# Patient Record
Sex: Male | Born: 1960
Health system: Southern US, Community
[De-identification: ages and names within clinical notes are randomized; demographics above are authoritative.]

## PROBLEM LIST (undated history)

## (undated) DIAGNOSIS — R609 Edema, unspecified: Secondary | ICD-10-CM

## (undated) DIAGNOSIS — R7301 Impaired fasting glucose: Secondary | ICD-10-CM

## (undated) DIAGNOSIS — N529 Male erectile dysfunction, unspecified: Secondary | ICD-10-CM

## (undated) DIAGNOSIS — Z87442 Personal history of urinary calculi: Secondary | ICD-10-CM

## (undated) DIAGNOSIS — N200 Calculus of kidney: Secondary | ICD-10-CM

## (undated) DIAGNOSIS — G4730 Sleep apnea, unspecified: Secondary | ICD-10-CM

## (undated) DIAGNOSIS — R7989 Other specified abnormal findings of blood chemistry: Secondary | ICD-10-CM

## (undated) DIAGNOSIS — K7689 Other specified diseases of liver: Secondary | ICD-10-CM

## (undated) DIAGNOSIS — I10 Essential (primary) hypertension: Secondary | ICD-10-CM

## (undated) DIAGNOSIS — M5136 Other intervertebral disc degeneration, lumbar region: Secondary | ICD-10-CM

## (undated) DIAGNOSIS — R0683 Snoring: Secondary | ICD-10-CM

## (undated) DIAGNOSIS — R0609 Other forms of dyspnea: Secondary | ICD-10-CM

## (undated) DIAGNOSIS — E538 Deficiency of other specified B group vitamins: Secondary | ICD-10-CM

## (undated) DIAGNOSIS — R5383 Other fatigue: Secondary | ICD-10-CM

## (undated) DIAGNOSIS — M179 Osteoarthritis of knee, unspecified: Secondary | ICD-10-CM

## (undated) DIAGNOSIS — M5126 Other intervertebral disc displacement, lumbar region: Secondary | ICD-10-CM

## (undated) DIAGNOSIS — R931 Abnormal findings on diagnostic imaging of heart and coronary circulation: Secondary | ICD-10-CM

## (undated) DIAGNOSIS — E66813 Obesity, class 3: Secondary | ICD-10-CM

## (undated) DIAGNOSIS — E7800 Pure hypercholesterolemia, unspecified: Secondary | ICD-10-CM

## (undated) DIAGNOSIS — K5790 Diverticulosis of intestine, part unspecified, without perforation or abscess without bleeding: Secondary | ICD-10-CM

## (undated) DIAGNOSIS — M1712 Unilateral primary osteoarthritis, left knee: Secondary | ICD-10-CM

## (undated) DIAGNOSIS — J309 Allergic rhinitis, unspecified: Secondary | ICD-10-CM

## (undated) DIAGNOSIS — M1990 Unspecified osteoarthritis, unspecified site: Secondary | ICD-10-CM

## (undated) DIAGNOSIS — M51369 Other intervertebral disc degeneration, lumbar region without mention of lumbar back pain or lower extremity pain: Secondary | ICD-10-CM

## (undated) DIAGNOSIS — N400 Enlarged prostate without lower urinary tract symptoms: Secondary | ICD-10-CM

## (undated) HISTORY — PX: CYSTOSCOPY W/ STONE MANIPULATION: SHX1427

## (undated) HISTORY — DX: Calculus of kidney: N20.0

## (undated) HISTORY — DX: Benign prostatic hyperplasia without lower urinary tract symptoms: N40.0

## (undated) HISTORY — DX: Other forms of dyspnea: R06.09

## (undated) HISTORY — DX: Obesity, class 3: E66.813

## (undated) HISTORY — DX: Essential (primary) hypertension: I10

## (undated) HISTORY — DX: Other specified diseases of liver: K76.89

## (undated) HISTORY — DX: Other fatigue: R53.83

## (undated) HISTORY — DX: Impaired fasting glucose: R73.01

## (undated) HISTORY — DX: Deficiency of other specified B group vitamins: E53.8

## (undated) HISTORY — DX: Male erectile dysfunction, unspecified: N52.9

## (undated) HISTORY — DX: Allergic rhinitis, unspecified: J30.9

## (undated) HISTORY — DX: Osteoarthritis of knee, unspecified: M17.9

## (undated) HISTORY — DX: Other specified abnormal findings of blood chemistry: R79.89

## (undated) HISTORY — DX: Pure hypercholesterolemia, unspecified: E78.00

## (undated) HISTORY — DX: Abnormal findings on diagnostic imaging of heart and coronary circulation: R93.1

## (undated) HISTORY — PX: LITHOTRIPSY: SUR834

## (undated) HISTORY — PX: ADENOIDECTOMY: SUR15

## (undated) HISTORY — PX: COLONOSCOPY: SHX174

## (undated) HISTORY — DX: Morbid (severe) obesity due to excess calories: E66.01

## (undated) HISTORY — DX: Diverticulosis of intestine, part unspecified, without perforation or abscess without bleeding: K57.90

## (undated) HISTORY — DX: Edema, unspecified: R60.9

---
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
## 2001-08-28 ENCOUNTER — Emergency Department (HOSPITAL_COMMUNITY): Admission: EM | Admit: 2001-08-28 | Discharge: 2001-08-28 | Payer: Self-pay | Admitting: Emergency Medicine

## 2001-08-28 ENCOUNTER — Encounter: Payer: Self-pay | Admitting: Emergency Medicine

## 2001-09-03 ENCOUNTER — Encounter: Payer: Self-pay | Admitting: Urology

## 2001-09-03 ENCOUNTER — Ambulatory Visit (HOSPITAL_BASED_OUTPATIENT_CLINIC_OR_DEPARTMENT_OTHER): Admission: RE | Admit: 2001-09-03 | Discharge: 2001-09-03 | Payer: Self-pay | Admitting: Urology

## 2001-11-29 ENCOUNTER — Ambulatory Visit (HOSPITAL_BASED_OUTPATIENT_CLINIC_OR_DEPARTMENT_OTHER): Admission: RE | Admit: 2001-11-29 | Discharge: 2001-11-29 | Payer: Self-pay | Admitting: Urology

## 2001-11-29 ENCOUNTER — Encounter: Payer: Self-pay | Admitting: Urology

## 2002-07-11 ENCOUNTER — Encounter: Admission: RE | Admit: 2002-07-11 | Discharge: 2002-07-11 | Payer: Self-pay | Admitting: Family Medicine

## 2002-07-11 ENCOUNTER — Encounter: Payer: Self-pay | Admitting: Family Medicine

## 2002-08-13 ENCOUNTER — Encounter: Admission: RE | Admit: 2002-08-13 | Discharge: 2002-08-22 | Payer: Self-pay | Admitting: Family Medicine

## 2003-08-05 ENCOUNTER — Ambulatory Visit (HOSPITAL_COMMUNITY): Admission: RE | Admit: 2003-08-05 | Discharge: 2003-08-05 | Payer: Self-pay | Admitting: Family Medicine

## 2003-08-21 ENCOUNTER — Encounter: Admission: RE | Admit: 2003-08-21 | Discharge: 2003-09-15 | Payer: Self-pay | Admitting: Family Medicine

## 2006-02-14 HISTORY — PX: LASIK: SHX215

## 2006-12-25 ENCOUNTER — Ambulatory Visit (HOSPITAL_BASED_OUTPATIENT_CLINIC_OR_DEPARTMENT_OTHER): Admission: RE | Admit: 2006-12-25 | Discharge: 2006-12-25 | Payer: Self-pay | Admitting: Family Medicine

## 2006-12-31 ENCOUNTER — Ambulatory Visit: Payer: Self-pay | Admitting: Internal Medicine

## 2008-04-08 ENCOUNTER — Encounter (INDEPENDENT_AMBULATORY_CARE_PROVIDER_SITE_OTHER): Payer: Self-pay | Admitting: Interventional Cardiology

## 2008-04-08 ENCOUNTER — Ambulatory Visit (HOSPITAL_COMMUNITY): Admission: RE | Admit: 2008-04-08 | Discharge: 2008-04-08 | Payer: Self-pay | Admitting: Interventional Cardiology

## 2008-04-16 ENCOUNTER — Ambulatory Visit (HOSPITAL_COMMUNITY): Admission: RE | Admit: 2008-04-16 | Discharge: 2008-04-16 | Payer: Self-pay | Admitting: Interventional Cardiology

## 2008-07-30 ENCOUNTER — Ambulatory Visit: Payer: Self-pay | Admitting: Sports Medicine

## 2008-07-30 DIAGNOSIS — M545 Low back pain, unspecified: Secondary | ICD-10-CM | POA: Insufficient documentation

## 2008-07-30 DIAGNOSIS — F4323 Adjustment disorder with mixed anxiety and depressed mood: Secondary | ICD-10-CM | POA: Insufficient documentation

## 2008-08-22 ENCOUNTER — Encounter: Payer: Self-pay | Admitting: Family Medicine

## 2008-08-22 ENCOUNTER — Ambulatory Visit: Payer: Self-pay | Admitting: Sports Medicine

## 2008-08-26 ENCOUNTER — Ambulatory Visit (HOSPITAL_COMMUNITY): Admission: RE | Admit: 2008-08-26 | Discharge: 2008-08-26 | Payer: Self-pay | Admitting: Sports Medicine

## 2008-08-26 ENCOUNTER — Ambulatory Visit: Payer: Self-pay | Admitting: Sports Medicine

## 2008-08-26 DIAGNOSIS — M5126 Other intervertebral disc displacement, lumbar region: Secondary | ICD-10-CM | POA: Insufficient documentation

## 2008-09-01 ENCOUNTER — Ambulatory Visit: Payer: Self-pay | Admitting: Sports Medicine

## 2008-09-01 ENCOUNTER — Encounter: Payer: Self-pay | Admitting: Family Medicine

## 2009-06-08 ENCOUNTER — Encounter: Admission: RE | Admit: 2009-06-08 | Discharge: 2009-06-08 | Payer: Self-pay | Admitting: Family Medicine

## 2009-10-07 ENCOUNTER — Ambulatory Visit: Payer: Self-pay | Admitting: Sports Medicine

## 2009-10-07 DIAGNOSIS — S838X9A Sprain of other specified parts of unspecified knee, initial encounter: Secondary | ICD-10-CM

## 2009-10-07 DIAGNOSIS — S86819A Strain of other muscle(s) and tendon(s) at lower leg level, unspecified leg, initial encounter: Secondary | ICD-10-CM | POA: Insufficient documentation

## 2009-10-07 DIAGNOSIS — M79609 Pain in unspecified limb: Secondary | ICD-10-CM | POA: Insufficient documentation

## 2010-02-01 ENCOUNTER — Emergency Department (HOSPITAL_COMMUNITY)
Admission: EM | Admit: 2010-02-01 | Discharge: 2010-02-01 | Payer: Self-pay | Source: Home / Self Care | Admitting: Family Medicine

## 2010-03-16 ENCOUNTER — Ambulatory Visit
Admission: RE | Admit: 2010-03-16 | Discharge: 2010-03-16 | Payer: Self-pay | Source: Home / Self Care | Attending: Family Medicine | Admitting: Family Medicine

## 2010-03-16 ENCOUNTER — Encounter
Admission: RE | Admit: 2010-03-16 | Discharge: 2010-03-16 | Payer: Self-pay | Source: Home / Self Care | Attending: Family Medicine | Admitting: Family Medicine

## 2010-03-16 DIAGNOSIS — M25569 Pain in unspecified knee: Secondary | ICD-10-CM | POA: Insufficient documentation

## 2010-03-16 NOTE — Assessment & Plan Note (Signed)
Summary: CALF PAIN,MC   Vital Signs:  Patient profile:   50 year old male Height:      71 inches Weight:      245 pounds BMI:     34.29 BP sitting:   136 / 97  Vitals Entered By: Lillia Pauls CMA (October 07, 2009 2:35 PM)  History of Present Illness: Pt is a 50 yo WM with left calf pain for 8 days while jogging, about 30 minutes in. This was normal time and intensity for him. He has felt tightness in the calf ever since and has not been able to stretch much due to pain. No pain in ankle or knee. He has done some rest, ice, and stretching. Ibu 800 hasn't helped. Celebrex has helped with other problems in past. He has a hx of HTN normally controlled with meds.  Allergies (verified): No Known Drug Allergies  Review of Systems General:  Denies chills, fatigue, fever, loss of appetite, malaise, sleep disorder, sweats, weakness, and weight loss. MS:  Complains of muscle aches and stiffness; denies joint pain, joint redness, joint swelling, loss of strength, low back pain, mid back pain, muscle , cramps, muscle weakness, and thoracic pain.  Physical Exam  General:  Well-developed,well-nourished,in no acute distress; alert,appropriate and cooperative throughout examination Msk:  No deformity or scoliosis noted of thoracic or lumbar spine.   Knee: Normal to inspection with no erythema or effusion or obvious bony abnormalities. Palpation normal with no warmth or joint line tenderness or patellar tenderness or condyle tenderness. ROM normal in flexion and extension and lower leg rotation. Ligaments with solid consistent endpoints including ACL, PCL, LCL, MCL. Negative Mcmurray's and provocative meniscal tests. Non painful patellar compression. Patellar and quadriceps tendons unremarkable. Hamstring and quadriceps strength is normal.   Ankle: No visible erythema or swelling. Range of motion is full in all directions. Strength is 5/5 in all directions. Stable lateral and medial ligaments;  squeeze test and kleiger test unremarkable; Talar dome nontender; No pain at base of 5th MT; No tenderness over cuboid; No tenderness over N spot or navicular prominence No tenderness on posterior aspects of lateral and medial malleolus No sign of peroneal tendon subluxations; Negative tarsal tunnel tinel's Able to walk 4 steps.  tender to deep palpation over the musculotendinous junction of the gastroc and soleus . No e/o swelling, redness, or palpable cords. Neurologic:  No cranial nerve deficits noted. Station and gait are normal. Plantar reflexes are down-going bilaterally. DTRs are symmetrical throughout. Sensory, motor and coordinative functions appear intact.   Impression & Recommendations:  Problem # 1:  MUSCLE STRAIN, LEFT CALF (ICD-844.8) celebrex for short term anti-inflammatory ice  stretching when painfree eccentric strengthening exercises shown follow up in 3-4 weeks if not improving  Complete Medication List: 1)  Amitriptyline Hcl 25 Mg Tabs (Amitriptyline hcl) .Marland Kitchen.. 1 or 2 by mouth qhs 2)  Tramadol Hcl 100 Mg Xr24h-tab (Tramadol hcl) .Marland Kitchen.. 1 tab 2-4 times daily as needed pain 3)  Neurontin 300 Mg Caps (Gabapentin) .Marland Kitchen.. 1 tab by mouth three times a day 4)  Celebrex 200 Mg Caps (Celecoxib) .... One once daily for seven days with food, then prn  Other Orders: Garment,belt,sleeve or other covering ,elastic or similar stretch (E4540) Prescriptions: CELEBREX 200 MG CAPS (CELECOXIB) one once daily for seven days with food, then prn  #60 x 2   Entered by:   Lillia Pauls CMA   Authorized by:   Corbin Ade MD   Signed by:   Arelia Longest  Moore CMA on 10/07/2009   Method used:   Electronically to        [facility]* (retail)       [address]       [address] Shipping/mailing       [place], [place]  [address]       Ph: [phone]       Fax: [phone]   RxID:   [id number]   Appended Document: CALF PAIN,MC     Allergies: No Known Drug  Allergies   Impression & Recommendations:  Problem # 1:  MUSCLE STRAIN, LEFT CALF (ICD-844.8) see other note, only needed to bill Est level 3  Complete Medication List: 1)  Amitriptyline Hcl 25 Mg Tabs (Amitriptyline hcl) .Marland Kitchen.. 1 or 2 by mouth qhs 2)  Tramadol Hcl 100 Mg Xr24h-tab (Tramadol hcl) .Marland Kitchen.. 1 tab 2-4 times daily as needed pain 3)  Neurontin 300 Mg Caps (Gabapentin) .Marland Kitchen.. 1 tab by mouth three times a day 4)  Celebrex 200 Mg Caps (Celecoxib) .... One once daily for seven days with food, then prn

## 2010-03-24 NOTE — Assessment & Plan Note (Signed)
Summary: KNEE PAIN,MC   Vital Signs:  Patient profile:   50 year old male Height:      71 inches Weight:      245 pounds Pulse rate:   65 / minute BP sitting:   126 / 89  (left arm)  Vitals Entered By: Rochele Pages RN (March 16, 2010 3:07 PM) CC: Lt knee pain- anterior   History of Present Illness: 50 year old male presents with left knee pain that has been ongoing for the last several weeks. Pain medially with crepitus anteriorly. A great deal of "creaking" and "popping" with flexion and movement of the knee. Really started to hurt last week when running on the treadmill, about 30 mins (approx 5 mins run time at about 5.1 mph pace) with run walking.   Then knee got significantly painful, pain medially, and he felt like he locked up while on the couch. Also felt this same sensation in the morning, but none sense. No giving way.  No prior operative interventions.  He has been taking Celebrex.  Preventive Screening-Counseling & Management  Alcohol-Tobacco     Smoking Status: never  Allergies: No Known Drug Allergies  Past History:  Past medical, surgical, family and social histories (including risk factors) reviewed, and no changes noted (except as noted below).  Past Surgical History: n/c  Family History: Reviewed history and no changes required. n/c  Social History: Reviewed history and no changes required. Works for [employer]. Active, enjoys running.Smoking Status:  never  Review of Systems       REVIEW OF SYSTEMS  GEN: No systemic complaints, no fevers, chills, sweats, or other acute illnesses MSK: Detailed in the HPI GI: tolerating PO intake without difficulty Neuro: No numbness, parasthesias, or tingling associated. Otherwise the pertinent positives of the ROS are noted above.    Physical Exam  General:  GEN: Well-developed,well-nourished,in no acute distress; alert,appropriate and cooperative throughout examination HEENT: Normocephalic and  atraumatic without obvious abnormalities. No apparent alopecia or balding. Ears, externally no deformities PULM: Breathing comfortably in no respiratory distress EXT: No clubbing, cyanosis, or edema PSYCH: Normally interactive. Cooperative during the interview. Pleasant. Friendly and conversant. Not anxious or depressed appearing. Normal, full affect.  Msk:  Gait: Normal heel toe pattern ROM: WNL Effusion: neg Echymosis or edema: none Patellar tendon NT Painful PLICA: neg -  BUT DIFFUSE, PROFOUND PLICA THROUGHOUT BOTH KNEES Patellar grind: negative Medial and lateral patellar facet loading: negative medial and lateral joint lines:POSTERIOR MEDIAL L SIDED JOINT LINE PAIN Mcmurray's POS Flexion-pinch neg BOUNCE HOME POS Varus and valgus stress: stable Lachman: neg Ant and Post drawer: POSTERIOR DRAWER WITH NOTABLE INCREASED POSTERIOR TRANSLATION Hip abduction, IR, ER: WNL Hip flexion str: 5/5 Hip abd: 5/5 Quad: 5/5 VMO atrophy:MILD Hamstring concentric and eccentric: 5/5    Impression & Recommendations:  Problem # 1:  KNEE PAIN, LEFT, ACUTE (ICD-719.46) Assessment New Probable medial  meniscal degenerative tear clinically I suspect that he has an old PCL injury and is PCL deficient as well -- may contribute to the instability of his knee.  Knee Injection Patient verbally consented to procedure. Risks, benefits, and alternatives explained. Sterilely prepped with betadine. Ethyl cholride used for anesthesia. 9 cc Lidocaine 1% mixed with 1 cc of Kenalog 40 mg injected using the anteromedial approach without difficulty. No complications with procedure and tolerated well. Patient had decreased pain post-injection.    His updated medication list for this problem includes:    Tramadol Hcl 100 Mg Xr24h-tab (Tramadol hcl) .Marland KitchenMarland KitchenMarland KitchenMarland Kitchen  1 tab 2-4 times daily as needed pain    Celebrex 200 Mg Caps (Celecoxib) ..... One once daily for seven days with food, then prn  Orders: Radiology other  (Radiology Other) Joint Aspirate / Injection, Large (20610) Kenalog 10mg  (4units) (J3301)  Complete Medication List: 1)  Amitriptyline Hcl 25 Mg Tabs (Amitriptyline hcl) .Marland Kitchen.. 1 or 2 by mouth qhs 2)  Tramadol Hcl 100 Mg Xr24h-tab (Tramadol hcl) .Marland Kitchen.. 1 tab 2-4 times daily as needed pain 3)  Neurontin 300 Mg Caps (Gabapentin) .Marland Kitchen.. 1 tab by mouth three times a day 4)  Celebrex 200 Mg Caps (Celecoxib) .... One once daily for seven days with food, then prn  Patient Instructions: 1)  Continue with Celebrex 2)  if it runs out, 3)  Alleve 2 tabs by mouth two times a day over the counter, take at least for 2 - 3 weeks. This is a prescription strength dose.  4)  GO FOR YOUR KNEE XRAYS 5)  f/u 4 weeks. 6)  After you have an injection of any joint, the numbing medicine will make it feel better for a few hours. Later tonight, it is common for the joint to feel worse. The steroid will take 48-72 hours to start working - it is the thing that will likely provide the most relief. 7)  Ice the joint where you had the injection at least 2-3 times a day for 20 minutes for 3 days. If have had swelling, pain in the joint itself, ice for 1 week. 8)  You can use an ice bag, frozen peas or corn, an ice pack - all work    Orders Added: 1)  Radiology other [Radiology Other] 2)  Est. Patient Level IV [09811] 3)  Joint Aspirate / Injection, Large [20610] 4)  Kenalog 10mg  (4units) [J3301]

## 2010-06-29 NOTE — Procedures (Signed)
NAME:  Adam Avila, [place] NO.:  [id number]   MEDICAL RECORD NO.:  [id number]          PATIENT TYPE:  OUT   LOCATION:  SLEEP CENTER                 FACILITY:  [facility]   PHYSICIAN:  [name], MD, FCCP, FACPDATE OF BIRTH:  1960/09/29   DATE OF STUDY:  12/25/2006                            NOCTURNAL POLYSOMNOGRAM   REFERRING PHYSICIAN:  Chales Salmon. Abigail Miyamoto, M.D.   INDICATION FOR STUDY:  Insomnia with sleep apnea.   EPWORTH SLEEPINESS SCORE:  10/24, BMI 30.1, weight 210 pounds, height 70  inches, neck 14.5 inches.   MEDICATIONS:  Home medication listed as Diovan.   SLEEP ARCHITECTURE:  Total sleep time 317 minutes with sleep efficiency  88%.  Stage I was 4%, stage II 73%, stage III 1%, REM 21% of total sleep  time.  Sleep latency 20 minutes, REM latency 56 minutes, awake after  sleep onset 22 minutes, arousal index 7.  Diovan was taken at bedtime.   RESPIRATORY DATA:  Apnea-hypopnea index (AHI) 4.2 obstructive events per  hour which is within normal limits (normal range 0 to 5 per hour).  There were a total of 22 respiratory events including 5 obstructive  apneas and 17 hypopneas.  All events occurred while sleeping supine.  REM AHI 11.8.  There were insufficient events to permit CPAP titration  by split protocol on this study night.   OXYGEN DATA:  Moderate to loud snoring with oxygen desaturation to a  nadir of 84%.  Mean oxygen saturation through the study was 96% on room  air.   CARDIAC DATA:  Normal sinus rhythm.   MOVEMENT-PARASOMNIA:  No significant movement disturbance.   IMPRESSIONS-RECOMMENDATIONS:  1. Unremarkable sleep architecture for sleep center environment.  2. Occasional respiratory events within normal limits, apnea-hypopnea      index 4.2 per hour (normal range 0 to 5 per hour).  Events were      more common while supine.  Moderate to loud snoring with oxygen      desaturation to a nadir of 84%.  3. Continuous positive airway pressure  therapy is not usually      indicated for scores in this range.  There was a      strong positional component, suggesting that emphasis on sleeping      off flat of back and, if appropriate, treating for a significant      upper airway or nasal obstruction and weight loss may be useful as      clinically indicated.      [name], MD, [facility], FACP  Diplomate, Biomedical engineer of Sleep Medicine  Electronically Signed     CDY/MEDQ  D:  12/31/2006 14:55:33  T:  01/01/2007 09:05:14  Job:  [id number]

## 2010-07-02 NOTE — Op Note (Signed)
NAME:  Adam Avila, Adam Avila                            ACCOUNT NO.:  [id number]   MEDICAL RECORD NO.:  [id number]                   PATIENT TYPE:  AMB   LOCATION:  [facility]                                 FACILITY:  [facility]   PHYSICIAN:  Excell Seltzer. Annabell Howells, M.D.                 DATE OF BIRTH:  12/24/1960   DATE OF PROCEDURE:  11/28/2001  DATE OF DISCHARGE:                                 OPERATIVE REPORT   PROCEDURE:  Left ureteroscopic stone extraction.   PREOPERATIVE DIAGNOSIS:  Left ureterovesical junction stone.   POSTOPERATIVE DIAGNOSIS:  Left ureterovesical junction stone.   SURGEON:  Excell Seltzer. Annabell Howells, M.D.   ANESTHESIA:  General.   SPECIMENS:  Stone.   COMPLICATIONS:  None.   INDICATIONS:  Mr. Serio is a 50 year old white male with a residual left  distal ureteral stone fragment, measuring approximately 4 mm, following a  lithotripsy.  The stone has failed to progress after over two months, and it  was felt that ureteroscopy was indicated.   FINDINGS AT PROCEDURE:  The patient was given p.o. Levaquin.  He was taken  to the operating room where a general anesthetic was induced.  He was placed  in lithotomy position.  His perineum and genitalia were prepped with  Betadine solution.  He was draped in the usual sterile fashion.  An initial  attempt was made to pass a 22 French cystoscope sheath; however, he had some  meatal narrowing, and the scope would not readily pass.  I then elected to  use the 6 French short ureteroscope.  This was passed per urethra and  advanced under visual guidance into the bladder.  The urethra was  unremarkable.  The prostatic urethra was short without obstruction.  Examination of the bladder revealed mild trabeculation.  No tumor, stones,  or inflammation were noted.  The ureteral orifices were unremarkable.  Initially, an attempt was made to advance the scope up the left ureteral  orifice.  This was not successful.  I then passed a guidewire through the  ureteroscope up the orifice and once again attempted to advance the scope  unsuccessfully.  I then removed the scope and advanced a 4 cm 15 French  balloon dilation catheter over the wire.  The balloon was then dilated  across the meatus to 10 atmospheres x 2.  When the waist disappeared, the  balloon was deflated and removed.  The 6 French ureteroscope was then passed  alongside the wire, up the ureter, until the stone was visualized.  The  stone was grasped with a nitinol basket and removed without difficulty.  Reinspection of the ureter after retrieval demonstrated some moderate  inflammatory changes in the distal ureter from the stone and dilation, but  it was not felt to be sufficiently severe to mandate a stent.  At this  point, the wire was removed along with the ureteroscope, and the bladder  was  drained with a 16 French red rubber catheter.  The patient was taken down  from the lithotomy position.  His anesthetic was reversed.  He was moved to  the recovery room in stable condition.  There were no complications.                                              Excell Seltzer. Annabell Howells, M.D.   JJW/MEDQ  D:  11/29/2001  T:  11/29/2001  Job:  [id number]

## 2010-07-15 ENCOUNTER — Emergency Department (HOSPITAL_COMMUNITY)
Admission: EM | Admit: 2010-07-15 | Discharge: 2010-07-15 | Disposition: A | Discharge status: Home / Self Care | Payer: 59 | Attending: Emergency Medicine | Admitting: Emergency Medicine

## 2010-07-15 DIAGNOSIS — M79609 Pain in unspecified limb: Secondary | ICD-10-CM | POA: Insufficient documentation

## 2010-07-15 DIAGNOSIS — W540XXA Bitten by dog, initial encounter: Secondary | ICD-10-CM | POA: Insufficient documentation

## 2010-07-15 DIAGNOSIS — L02519 Cutaneous abscess of unspecified hand: Secondary | ICD-10-CM | POA: Insufficient documentation

## 2010-07-15 DIAGNOSIS — M7989 Other specified soft tissue disorders: Secondary | ICD-10-CM | POA: Insufficient documentation

## 2010-07-15 DIAGNOSIS — I10 Essential (primary) hypertension: Secondary | ICD-10-CM | POA: Insufficient documentation

## 2010-07-15 DIAGNOSIS — S61409A Unspecified open wound of unspecified hand, initial encounter: Secondary | ICD-10-CM | POA: Insufficient documentation

## 2013-11-07 ENCOUNTER — Encounter: Payer: Self-pay | Admitting: Sports Medicine

## 2013-11-07 ENCOUNTER — Ambulatory Visit (INDEPENDENT_AMBULATORY_CARE_PROVIDER_SITE_OTHER): Payer: 59 | Admitting: Sports Medicine

## 2013-11-07 VITALS — BP 134/91 | Ht 71.0 in | Wt 250.0 lb

## 2013-11-07 DIAGNOSIS — M1712 Unilateral primary osteoarthritis, left knee: Secondary | ICD-10-CM | POA: Insufficient documentation

## 2013-11-07 DIAGNOSIS — M1710 Unilateral primary osteoarthritis, unspecified knee: Secondary | ICD-10-CM

## 2013-11-07 DIAGNOSIS — M5126 Other intervertebral disc displacement, lumbar region: Secondary | ICD-10-CM

## 2013-11-07 DIAGNOSIS — M5430 Sciatica, unspecified side: Secondary | ICD-10-CM

## 2013-11-07 DIAGNOSIS — M5441 Lumbago with sciatica, right side: Secondary | ICD-10-CM | POA: Insufficient documentation

## 2013-11-07 MED ORDER — METHYLPREDNISOLONE ACETATE 40 MG/ML IJ SUSP
40.0000 mg | Freq: Once | INTRAMUSCULAR | Status: AC
  Administered 2013-11-07: 40 mg via INTRA_ARTICULAR

## 2013-11-07 NOTE — Assessment & Plan Note (Signed)
DDD, bulging disc with mid radiculopathy  Recommends: -continue celexbrex 200mg  daily and may increase to BID with flares -Given exercises for core stability -Stay active and working on weight loss -Provided note to possibly obtain ergonomic chair with lumbar support at work

## 2013-11-07 NOTE — Patient Instructions (Signed)
It was great to see you today! Call the office if you have any questions [phone]   1. Work are stabilizing exercises 2 work on quad and hamstring strengthening exercises 3 make followup in one month

## 2013-11-07 NOTE — Assessment & Plan Note (Deleted)
DDD, bulging disc with mid radiculopathy  Recommends: -continue celexbrex 200mg  daily and may increase to BID with flares -Given exercises for core stability -Stay active and working on weight loss -Provided note to possibly obtain ergonomic chair with lumbar support at work

## 2013-11-07 NOTE — Progress Notes (Signed)
Adam Avila - 53 y.o. male MRN [id number]  Date of birth: [birth year]  SUBJECTIVE:  Including CC & ROS.  Patient is a obese 53 yo male who male who presents today to f/u on his long standing history of chronic right lumbar back pain and chronic left knee pain.   LBP: Patient reports know history of bulging disc found several years ago. MRI of lumbar spine 2010 showed L4-5 where a disc bulge eccentric to the right slightly deflects the descending right L5 root. He reports no constant pain but instead he gets intermittent flares every several months. He has been managing pain with Celebrex 200mg  daily and staying active for walking 3 miles daily. He reports some radiation of pain into the right buttock but no pain, numbness, or tingling down the leg.   L Knee pain: also present for years with no MOI, slow progressive onset. Worse with describes an intermittent dull ache worse with stairs, stooping, squatting, no catching or giving way but frequent popping and clicking. Pain is diffuse not localized to either joint line. Pt reports good response to steroid injection in 2012. Also controlling pain with celebrex and staying active.     ROS: Review of systems otherwise negative except for information present in HPI  HISTORY: Past Medical, Surgical, Social, and Family History Reviewed & Updated per EMR. Pertinent Historical Findings include: Obesity Chronic back pain Mild to moderate DJD   DATA REVIEWED: MRI of lumbar spine 2010 showed L4-5 where a disc bulge eccentric to the right slightly deflects the descending right L5 root. 3 view Left knee xray 2012 with mild to moderate DJD with medial joint line narrowing  PHYSICAL EXAM:  VS: BP:134/91 mmHg  HR: bpm  TEMP: ( )  RESP:   HT:5\' 11"  (180.3 cm)   WT:250 lb (113.399 kg)  BMI:34.9 KNEE EXAM:  General: well nourished Skin of LE: warm; dry, no rashes, lesions, ecchymosis or erythema. Vascular: Dorsal pedal pulses 2+  bilaterally Neurologically: Sensation to light touch lower extremities equal and intact bilaterally.  Observation: no knee effusion visualized, no previous surgical scars, patella in place, no quad muscle atrophy Palpation:  No evidence of knee effusion with negative ballotable patella sign, no swelling to supra-patellar bursa  No tenderness over the tibial tuberosity or patellar tendons, or quadriceps tendon.   No pain along the lateral joint line or medial joint line Range of motion: Full ROM at the hip. Full knee flexion to approximately 140, full extension 0, normal patellar tracking Ligamentous testing: ligamentous stability intact, Negative patella apprehension test Meniscal evaluation: Negative McMurray's test, Negative thessaly's test, Normal gait  LOW BACK EXAM: Observation: Normal curvature and no kyphosis or lordosis, no scoliosis.  Iliac crests are symmetric, shoulders line symmetrically Palpation: No step off defects noted in the thoracic or lumbar spine.   Range of motion: Normal flexion on forward bending, no pain with extension, no pain with one leg hyperextension. Neuromuscular: No pain with straight leg raise left or right, normal gait walking with walking on heels or walking on toes, normal tandem gait.  Nerve root motor and sensory intervention  L2 and L3: Normal hip flexion with no weakness. L2, L3, L4: Normal hip abduction bilaterally.  Normal patellar DTR +3 bilaterally L4, L5, S1: Normal hip abduction bilaterally S1 and S2: Normal ankle plantar-flexion bilaterally.  Normal Achilles tendon DTR +3 bilaterally L5: Normal extensor hallucis longus bilaterally  ASSESSMENT & PLAN: See problem based charting & AVS for pt instructions. Injection procedure: Consent  obtained and verified. Sterile betadine prep. Further cleansed with alcohol. Topical analgesic spray: Ethyl chloride. Injection Indication: Left knee primary OA Approached in typical fashion with: lateral  approach Completed without difficulty Meds: 1cc 40mg  DepoMedrol, 4cc lidocaine 1% Needle: 25g 1.5in Aftercare instructions and Red flags advised. Advised to call if fevers/chills, erythema, induration, drainage, or persistent bleeding.

## 2013-12-05 ENCOUNTER — Ambulatory Visit (INDEPENDENT_AMBULATORY_CARE_PROVIDER_SITE_OTHER): Payer: 59 | Admitting: Sports Medicine

## 2013-12-05 ENCOUNTER — Encounter: Payer: Self-pay | Admitting: Sports Medicine

## 2013-12-05 ENCOUNTER — Ambulatory Visit (HOSPITAL_COMMUNITY)
Admission: RE | Admit: 2013-12-05 | Discharge: 2013-12-05 | Disposition: A | Discharge status: Home / Self Care | Payer: 59 | Source: Ambulatory Visit | Attending: Sports Medicine | Admitting: Sports Medicine

## 2013-12-05 VITALS — BP 132/86 | HR 64 | Ht 71.0 in | Wt 250.0 lb

## 2013-12-05 DIAGNOSIS — M23304 Other meniscus derangements, unspecified medial meniscus, left knee: Secondary | ICD-10-CM | POA: Insufficient documentation

## 2013-12-05 DIAGNOSIS — S83242D Other tear of medial meniscus, current injury, left knee, subsequent encounter: Secondary | ICD-10-CM

## 2013-12-05 DIAGNOSIS — M23204 Derangement of unspecified medial meniscus due to old tear or injury, left knee: Secondary | ICD-10-CM

## 2013-12-05 DIAGNOSIS — S83282A Other tear of lateral meniscus, current injury, left knee, initial encounter: Secondary | ICD-10-CM | POA: Insufficient documentation

## 2013-12-05 NOTE — Progress Notes (Signed)
  Adam Avila - 53 y.o. male MRN [id number]  Date of birth: [birth year]  SUBJECTIVE:  Including CC & ROS.  Patient is a obese 53 yo male who male who presents today to f/u on his long standing history of chronic right lumbar back pain and chronic left knee pain.   L Knee pain: also present for years with no MOI, slow progressive onset. Worse with describes an intermittent dull ache worse with stairs, stooping, squatting, intermittent catching and locking no giving way but frequent popping and clicking. Pt reports good response to steroid injection at our last visit 4 weeks ago which help the inflammation and pain but still have intermittent dull ache with more aggressive walking, or any squatting. He is concern that the catching sensation feel like something is stuck in the knee.  LBP: Patient reports know history of bulging disc found several years ago. MRI of lumbar spine 2010 showed L4-5 where a disc bulge to right L5 root. He reports no constant pain but instead he gets intermittent flares every several months. He has been managing pain with Celebrex 200mg  daily and staying active for walking 3 miles daily. He reports some radiation of pain into the right buttock but no pain, numbness, or tingling down the leg. This is fairly will managed with the exercise daily and HEP provided at last visit  ROS: Review of systems otherwise negative except for information present in HPI  HISTORY: Past Medical, Surgical, Social, and Family History Reviewed & Updated per EMR. Pertinent Historical Findings include: Obesity Chronic back pain Mild to moderate DJD   DATA REVIEWED: MRI of lumbar spine 2010 showed L4-5 where a disc bulge eccentric to the right slightly deflects the descending right L5 root. 3 view Left knee xray 2012 with mild to moderate DJD with medial joint line narrowing  PHYSICAL EXAM:  VS: BP:132/86 mmHg  HR:64bpm  TEMP: ( )  RESP:   HT:5\' 11"  (180.3 cm)   WT:250 lb (113.399 kg)   BMI:34.9 KNEE EXAM:  General: well nourished Skin of LE: warm; dry, no rashes, lesions, ecchymosis or erythema. Vascular: Dorsal pedal pulses 2+ bilaterally Neurologically: Sensation to light touch lower extremities equal and intact bilaterally.  Observation: no knee effusion visualized, no previous surgical scars, patella in place, no quad muscle atrophy Palpation:  No evidence of knee effusion with negative ballotable patella sign, no swelling to supra-patellar bursa  No tenderness over the tibial tuberosity or patellar tendons, or quadriceps tendon.   Pain along medial joint line Range of motion: Full ROM at the hip. Full knee flexion to approximately 140, full extension 0, normal patellar tracking Ligamentous testing: ligamentous stability intact, Negative patella apprehension test Meniscal evaluation: Positive for clicking and pain McMurray's test, positive for pain with thessaly's test, Normal gait  ASSESSMENT & PLAN: See problem based charting & AVS for pt instructions.

## 2013-12-05 NOTE — Patient Instructions (Signed)
X-rays can be done at the hospital today without at appt  MRI is set for Thursday 12/12/13 at 3pm at [facility] time is 245p [phone]

## 2013-12-05 NOTE — Assessment & Plan Note (Signed)
Impression: Left knee medial meniscus tear  Patient's history of persistent knee pain with sensations of locking and catching, positive medial joint line tenderness, positive clicking and pain with McMurray's and thus a least I am concerned for possible medial meniscus tear. Patient also has some underlying osteoarthritis that is mild based on his x-rays 3 years ago.  Recommendations: -Repeat 4 view knee x-rays to evaluate level of osteoarthritis -Given the patient has failed conservative management including cortisone injection, therapeutic exercise, and working on weight loss with an exam concerning for meniscus tear I recommended evaluating his knee with MRI. -Will followup with patient over the phone based on MRI results patient is comfortable with referral to orthopedic surgeon for possible arthroscopic surgery if there is presence of degenerative meniscus tear

## 2013-12-12 ENCOUNTER — Ambulatory Visit (HOSPITAL_COMMUNITY): Payer: 59

## 2013-12-19 ENCOUNTER — Ambulatory Visit (HOSPITAL_COMMUNITY): Admission: RE | Admit: 2013-12-19 | Payer: 59 | Source: Ambulatory Visit

## 2014-02-19 ENCOUNTER — Ambulatory Visit (HOSPITAL_COMMUNITY)
Admission: RE | Admit: 2014-02-19 | Discharge: 2014-02-19 | Disposition: A | Discharge status: Home / Self Care | Payer: 59 | Source: Ambulatory Visit | Attending: Sports Medicine | Admitting: Sports Medicine

## 2014-02-19 DIAGNOSIS — M179 Osteoarthritis of knee, unspecified: Secondary | ICD-10-CM | POA: Insufficient documentation

## 2014-02-19 DIAGNOSIS — R937 Abnormal findings on diagnostic imaging of other parts of musculoskeletal system: Secondary | ICD-10-CM | POA: Insufficient documentation

## 2014-02-19 DIAGNOSIS — S86812A Strain of other muscle(s) and tendon(s) at lower leg level, left leg, initial encounter: Secondary | ICD-10-CM | POA: Diagnosis not present

## 2014-02-19 DIAGNOSIS — X58XXXA Exposure to other specified factors, initial encounter: Secondary | ICD-10-CM | POA: Diagnosis not present

## 2014-02-19 DIAGNOSIS — M65862 Other synovitis and tenosynovitis, left lower leg: Secondary | ICD-10-CM | POA: Insufficient documentation

## 2014-02-19 DIAGNOSIS — M94262 Chondromalacia, left knee: Secondary | ICD-10-CM | POA: Diagnosis not present

## 2014-02-19 DIAGNOSIS — S83242D Other tear of medial meniscus, current injury, left knee, subsequent encounter: Secondary | ICD-10-CM

## 2014-02-19 DIAGNOSIS — S83242A Other tear of medial meniscus, current injury, left knee, initial encounter: Secondary | ICD-10-CM | POA: Diagnosis present

## 2014-02-19 DIAGNOSIS — M25562 Pain in left knee: Secondary | ICD-10-CM | POA: Diagnosis present

## 2014-02-19 DIAGNOSIS — M25462 Effusion, left knee: Secondary | ICD-10-CM | POA: Diagnosis not present

## 2014-02-21 ENCOUNTER — Telehealth: Payer: Self-pay | Admitting: Sports Medicine

## 2014-02-21 DIAGNOSIS — S83282A Other tear of lateral meniscus, current injury, left knee, initial encounter: Secondary | ICD-10-CM

## 2014-02-21 DIAGNOSIS — M1712 Unilateral primary osteoarthritis, left knee: Secondary | ICD-10-CM

## 2014-02-25 ENCOUNTER — Encounter: Payer: Self-pay | Admitting: *Deleted

## 2014-02-25 NOTE — Telephone Encounter (Signed)
Spoke with patient over the phone to discuss MRI results.   IMPRESSION: 1. Severe lateral compartment osteoarthritis with grade IV chondromalacia of the weight-bearing tibial plateau. 2. Abnormal morphology of the lateral meniscus suggesting a severely degenerated discoid meniscus. The anterior horn is severely degenerated with diffuse fraying. 3. Intact medial meniscus. 4. Patellar tendinosis with a small superior patellar tendon intrasubstance tear.  Electronically Signed By: Dereck Ligas M.D. On: 02/19/2014 12:26  After a discuss of the severity of this patient internal derangement he was agree for referral to orthopedic surgery. Patient will be schedule with Dr. Noemi Chapel.

## 2014-02-25 NOTE — Patient Instructions (Signed)
[place] DR Noemi Chapel THURS 02/27/14 @215PM  [address] [place] [address] [phone]

## 2014-12-17 ENCOUNTER — Encounter (HOSPITAL_COMMUNITY): Payer: Self-pay | Admitting: Physician Assistant

## 2014-12-17 NOTE — H&P (Signed)
TOTAL KNEE ADMISSION H&P  Patient is being admitted for left total knee arthroplasty.  Subjective:  Chief Complaint:left knee pain.  HPI: Adam Avila, 54 y.o. male, has a history of pain and functional disability in the left knee due to arthritis and has failed non-surgical conservative treatments for greater than 12 weeks to includeNSAID's and/or analgesics, corticosteriod injections, viscosupplementation injections, flexibility and strengthening excercises, supervised PT with diminished ADL's post treatment, weight reduction as appropriate and activity modification.  Onset of symptoms was gradual, starting 9 years ago with gradually worsening course since that time. The patient noted no past surgery on the left knee(s).  Patient currently rates pain in the left knee(s) at 9 out of 10 with activity. Patient has night pain, worsening of pain with activity and weight bearing, pain that interferes with activities of daily living, crepitus and joint swelling.  Patient has evidence of subchondral sclerosis, periarticular osteophytes and joint space narrowing by imaging studies.  There is no active infection.  Patient Active Problem List   Diagnosis Date Noted  . Tear of lateral meniscus of left knee 12/05/2013  . Primary osteoarthritis of left knee 11/07/2013  . Right-sided low back pain with right-sided sciatica 11/07/2013  . MUSCLE STRAIN, LEFT CALF 10/07/2009  . HERNIATED LUMBAR DISK WITH RADICULOPATHY 08/26/2008  . ADJ DISORDER WITH MIXED ANXIETY & DEPRESSED MOOD 07/30/2008   Past Medical History  Diagnosis Date  . Hypertension   . HNP (herniated nucleus pulposus), lumbar     l4-5  . DDD (degenerative disc disease), lumbar     Past Surgical History  Procedure Laterality Date  . Lithotripsy    . Cystoscopy w/ stone manipulation      No prescriptions prior to admission  No current facility-administered medications for this encounter.  Current outpatient prescriptions:  .   celecoxib (CELEBREX) 200 MG capsule, Take 200 mg by mouth 2 (two) times daily., Disp: , Rfl:  No Known Allergies  Social History  Substance Use Topics  . Smoking status: Never Smoker   . Smokeless tobacco: Not on file  . Alcohol Use: 0.0 oz/week    0 Standard drinks or equivalent per week     Comment: weekly     Family History  Problem Relation Age of Onset  . Osteoarthritis Mother   . Osteoarthritis Father   . Hypertension Father   . AAA (abdominal aortic aneurysm) Father   . Diabetes Sister      Review of Systems  HENT: Negative.   Eyes: Negative.   Respiratory: Negative.   Cardiovascular: Negative.   Gastrointestinal: Negative.   Genitourinary: Negative.   Musculoskeletal: Positive for joint pain.       Left knee  Skin: Negative.   Neurological: Negative.   Endo/Heme/Allergies: Negative.   Psychiatric/Behavioral: Negative.     Objective:  Physical Exam  Constitutional: He is oriented to person, place, and time. He appears well-developed and well-nourished.  HENT:  Head: Normocephalic and atraumatic.  Mouth/Throat: Oropharynx is clear and moist.  Eyes: Pupils are equal, round, and reactive to light.  Neck: Neck supple.  Cardiovascular: Normal rate and regular rhythm.   Respiratory: Effort normal and breath sounds normal.  GI: Soft. Bowel sounds are normal.  Genitourinary:  Not pertinent to current symptomatology therefore not examined.  Musculoskeletal:  Examination of his left knee reveals pain medially and laterally.  Mild valgus deformity.  1+ crepitation.  Range of motion -5 to 125 degrees.  Knee is stable with normal patella tracking.  Examination  of the right knee reveals full range of motion without pain, swelling, weakness or instability.    Neurological: He is alert and oriented to person, place, and time.  Skin: Skin is warm and dry.  Psychiatric: He has a normal mood and affect. His behavior is normal.    Vital signs in last 24 hours:     Labs:   Estimated body mass index is 34.88 kg/(m^2) as calculated from the following:   Height as of 12/05/13: 5\' 11"  (1.803 m).   Weight as of 02/19/14: 113.399 kg (250 lb).   Imaging Review Plain radiographs demonstrate severe degenerative joint disease of the left knee(s). The overall alignment issignificant valgus. The bone quality appears to be good for age and reported activity level.  Assessment/Plan:  End stage arthritis, left knee   The patient history, physical examination, clinical judgment of the provider and imaging studies are consistent with end stage degenerative joint disease of the left knee(s) and total knee arthroplasty is deemed medically necessary. The treatment options including medical management, injection therapy arthroscopy and arthroplasty were discussed at length. The risks and benefits of total knee arthroplasty were presented and reviewed. The risks due to aseptic loosening, infection, stiffness, patella tracking problems, thromboembolic complications and other imponderables were discussed. The patient acknowledged the explanation, agreed to proceed with the plan and consent was signed. Patient is being admitted for inpatient treatment for surgery, pain control, PT, OT, prophylactic antibiotics, VTE prophylaxis, progressive ambulation and ADL's and discharge planning. The patient is planning to be discharged home with home health services

## 2014-12-18 NOTE — Pre-Procedure Instructions (Signed)
Adam Avila  12/18/2014      [facility] - [place], [place] - [address] [place] [address] Phone: [phone] Fax: [phone]    Your procedure is scheduled on Mon, Nov 14 @ 11:00 AM  Report to [facility] Admitting at 9:00 AM  Call this number if you have problems the morning of surgery:  [phone]   Remember:  Do not eat food or drink liquids after midnight.  Take these medicines the morning of surgery with A SIP OF WATER                No Goody's,BC's,Aleve,Aspirin,Ibuprofen,Advil,Motrin,Fish Oil,or any Herbal Medications.    Do not wear jewelry.  Do not wear lotions, powders, or perfumes.  You may wear deodorant.  Do not shave 48 hours prior to surgery.  Men may shave face and neck.  Do not bring valuables to the hospital.  [facility] is not responsible for any belongings or valuables.  Contacts, dentures or bridgework may not be worn into surgery.  Leave your suitcase in the car.  After surgery it may be brought to your room.  For patients admitted to the hospital, discharge time will be determined by your treatment team.  Patients discharged the day of surgery will not be allowed to drive home.    Special instructions:  [facility] - Preparing for Surgery  Before surgery, you can play an important role.  Because skin is not sterile, your skin needs to be as free of germs as possible.  You can reduce the number of germs on you skin by washing with CHG (chlorahexidine gluconate) soap before surgery.  CHG is an antiseptic cleaner which kills germs and bonds with the skin to continue killing germs even after washing.  Please DO NOT use if you have an allergy to CHG or antibacterial soaps.  If your skin becomes reddened/irritated stop using the CHG and inform your nurse when you arrive at Short Stay.  Do not shave (including legs and underarms) for at least 48 hours prior to the first CHG shower.   You may shave your face.  Please follow these instructions carefully:   1.  Shower with CHG Soap the night before surgery and the                                morning of Surgery.  2.  If you choose to wash your hair, wash your hair first as usual with your       normal shampoo.  3.  After you shampoo, rinse your hair and body thoroughly to remove the                      Shampoo.  4.  Use CHG as you would any other liquid soap.  You can apply chg directly       to the skin and wash gently with scrungie or a clean washcloth.  5.  Apply the CHG Soap to your body ONLY FROM THE NECK DOWN.        Do not use on open wounds or open sores.  Avoid contact with your eyes,       ears, mouth and genitals (private parts).  Wash genitals (private parts)       with your normal soap.  6.  Wash thoroughly, paying special attention to  the area where your surgery        will be performed.  7.  Thoroughly rinse your body with warm water from the neck down.  8.  DO NOT shower/wash with your normal soap after using and rinsing off       the CHG Soap.  9.  Pat yourself dry with a clean towel.            10.  Wear clean pajamas.            11.  Place clean sheets on your bed the night of your first shower and do not        sleep with pets.  Day of Surgery  Do not apply any lotions/deoderants the morning of surgery.  Please wear clean clothes to the hospital/surgery center.    Please read over the following fact sheets that you were given. Pain Booklet, Coughing and Deep Breathing, Blood Transfusion Information, MRSA Information and Surgical Site Infection Prevention

## 2014-12-19 ENCOUNTER — Encounter (HOSPITAL_COMMUNITY): Payer: Self-pay

## 2014-12-19 ENCOUNTER — Encounter (HOSPITAL_COMMUNITY)
Admission: RE | Admit: 2014-12-19 | Discharge: 2014-12-19 | Disposition: A | Discharge status: Home / Self Care | Payer: 59 | Source: Ambulatory Visit | Attending: Orthopedic Surgery | Admitting: Orthopedic Surgery

## 2014-12-19 DIAGNOSIS — Z01812 Encounter for preprocedural laboratory examination: Secondary | ICD-10-CM | POA: Insufficient documentation

## 2014-12-19 DIAGNOSIS — Z01818 Encounter for other preprocedural examination: Secondary | ICD-10-CM | POA: Insufficient documentation

## 2014-12-19 DIAGNOSIS — M179 Osteoarthritis of knee, unspecified: Secondary | ICD-10-CM | POA: Insufficient documentation

## 2014-12-19 DIAGNOSIS — Z0183 Encounter for blood typing: Secondary | ICD-10-CM | POA: Insufficient documentation

## 2014-12-19 HISTORY — DX: Snoring: R06.83

## 2014-12-19 LAB — TYPE AND SCREEN
ABO/RH(D): O POS
ANTIBODY SCREEN: NEGATIVE

## 2014-12-19 LAB — CBC WITH DIFFERENTIAL/PLATELET
Basophils Absolute: 0 10*3/uL (ref 0.0–0.1)
Basophils Relative: 1 %
EOS ABS: 0.1 10*3/uL (ref 0.0–0.7)
Eosinophils Relative: 1 %
HEMATOCRIT: 46.6 % (ref 39.0–52.0)
HEMOGLOBIN: 16.5 g/dL (ref 13.0–17.0)
LYMPHS ABS: 2.2 10*3/uL (ref 0.7–4.0)
Lymphocytes Relative: 31 %
MCH: 32.2 pg (ref 26.0–34.0)
MCHC: 35.4 g/dL (ref 30.0–36.0)
MCV: 90.8 fL (ref 78.0–100.0)
MONOS PCT: 9 %
Monocytes Absolute: 0.6 10*3/uL (ref 0.1–1.0)
NEUTROS PCT: 58 %
Neutro Abs: 4.3 10*3/uL (ref 1.7–7.7)
Platelets: 231 10*3/uL (ref 150–400)
RBC: 5.13 MIL/uL (ref 4.22–5.81)
RDW: 12.6 % (ref 11.5–15.5)
WBC: 7.3 10*3/uL (ref 4.0–10.5)

## 2014-12-19 LAB — COMPREHENSIVE METABOLIC PANEL
ALK PHOS: 53 U/L (ref 38–126)
ALT: 34 U/L (ref 17–63)
AST: 33 U/L (ref 15–41)
Albumin: 3.8 g/dL (ref 3.5–5.0)
Anion gap: 8 (ref 5–15)
BUN: 17 mg/dL (ref 6–20)
CHLORIDE: 108 mmol/L (ref 101–111)
CO2: 22 mmol/L (ref 22–32)
CREATININE: 1.03 mg/dL (ref 0.61–1.24)
Calcium: 9.4 mg/dL (ref 8.9–10.3)
GFR calc Af Amer: >60 mL/min (ref >60)
GFR calc non Af Amer: >60 mL/min (ref >60)
GLUCOSE: 103 mg/dL — AB (ref 65–99)
POTASSIUM: 3.9 mmol/L (ref 3.5–5.1)
SODIUM: 138 mmol/L (ref 135–145)
Total Bilirubin: 0.9 mg/dL (ref 0.3–1.2)
Total Protein: 7 g/dL (ref 6.5–8.1)

## 2014-12-19 LAB — SURGICAL PCR SCREEN
MRSA, PCR: NEGATIVE
Staphylococcus aureus: NEGATIVE

## 2014-12-19 LAB — ABO/RH: ABO/RH(D): O POS

## 2014-12-19 LAB — PROTIME-INR
INR: 1.08 (ref 0.00–1.49)
Prothrombin Time: 14.2 seconds (ref 11.6–15.2)

## 2014-12-19 LAB — APTT: APTT: 29 s (ref 24–37)

## 2014-12-19 NOTE — Progress Notes (Signed)
   12/19/14 0943  OBSTRUCTIVE SLEEP APNEA  Have you ever been diagnosed with sleep apnea through a sleep study? No  Do you snore loudly (loud enough to be heard through closed doors)?  1  Do you often feel tired, fatigued, or sleepy during the daytime (such as falling asleep during driving or talking to someone)? 0  Has anyone observed you stop breathing during your sleep? 1  Do you have, or are you being treated for high blood pressure? 1  BMI more than 35 kg/m2? 1  Age > 50 (1-yes) 1  Neck circumference greater than:Male 16 inches or larger, Male 17inches or larger? 1  Male Gender (Yes=1) 1  Obstructive Sleep Apnea Score 7  Score 5 or greater  Results sent to PCP   This patient has been screened at risk for sleep apnea using the STOP Bang tool used during a pre-surgical visit. A score of 4 or greater is at risk for sleep apnea.

## 2014-12-19 NOTE — Pre-Procedure Instructions (Signed)
Adam Avila  12/19/2014     Your procedure is scheduled on Mon, December 29, 2014 at 11:00 AM  Report to [facility] Admitting at 9:00 AM  Call this number if you have problems the morning of surgery: [phone]   Remember:  Do not eat food or drink liquids after midnight.  Take these medicines the morning of surgery with A SIP OF WATER: NONE              Stop taking any, Celebrex, vitamins, Goody's,BC's,Aleve,Aspirin, Ibuprofen, Advil, Motrin, Fish Oil, or any Herbal Medications.    Do not wear jewelry.  Do not wear lotions, powders, or cologne.    Men may shave face and neck.  Do not bring valuables to the hospital.  [facility] is not responsible for any belongings or valuables.  Contacts, dentures or bridgework may not be worn into surgery.  Leave your suitcase in the car.  After surgery it may be brought to your room.  For patients admitted to the hospital, discharge time will be determined by your treatment team.  Patients discharged the day of surgery will not be allowed to drive home.   Please read over the following fact sheets that you were given. Pain Booklet, Coughing and Deep Breathing, Blood Transfusion Information, MRSA Information and Surgical Site Infection Prevention

## 2014-12-20 LAB — URINE CULTURE: CULTURE: NO GROWTH

## 2014-12-28 MED ORDER — DEXTROSE 5 % IV SOLN
3.0000 g | INTRAVENOUS | Status: AC
  Administered 2014-12-29: 3 g via INTRAVENOUS
  Filled 2014-12-28: qty 3000

## 2014-12-29 ENCOUNTER — Inpatient Hospital Stay (HOSPITAL_COMMUNITY): Payer: 59 | Admitting: Certified Registered Nurse Anesthetist

## 2014-12-29 ENCOUNTER — Encounter (HOSPITAL_COMMUNITY): Payer: Self-pay | Admitting: *Deleted

## 2014-12-29 ENCOUNTER — Encounter (HOSPITAL_COMMUNITY)
Admission: RE | Disposition: A | Discharge status: Home Health | Payer: Self-pay | Source: Ambulatory Visit | Attending: Orthopedic Surgery

## 2014-12-29 ENCOUNTER — Inpatient Hospital Stay (HOSPITAL_COMMUNITY)
Admission: RE | Admit: 2014-12-29 | Discharge: 2014-12-30 | DRG: 470 | Disposition: A | Discharge status: Home Health | Payer: 59 | Source: Ambulatory Visit | Attending: Orthopedic Surgery | Admitting: Orthopedic Surgery

## 2014-12-29 DIAGNOSIS — M1712 Unilateral primary osteoarthritis, left knee: Principal | ICD-10-CM

## 2014-12-29 DIAGNOSIS — M5116 Intervertebral disc disorders with radiculopathy, lumbar region: Secondary | ICD-10-CM | POA: Diagnosis present

## 2014-12-29 DIAGNOSIS — M179 Osteoarthritis of knee, unspecified: Secondary | ICD-10-CM | POA: Diagnosis present

## 2014-12-29 DIAGNOSIS — F4323 Adjustment disorder with mixed anxiety and depressed mood: Secondary | ICD-10-CM | POA: Diagnosis present

## 2014-12-29 DIAGNOSIS — M5441 Lumbago with sciatica, right side: Secondary | ICD-10-CM | POA: Diagnosis present

## 2014-12-29 DIAGNOSIS — Z6839 Body mass index (BMI) 39.0-39.9, adult: Secondary | ICD-10-CM

## 2014-12-29 DIAGNOSIS — M5126 Other intervertebral disc displacement, lumbar region: Secondary | ICD-10-CM | POA: Diagnosis present

## 2014-12-29 DIAGNOSIS — I10 Essential (primary) hypertension: Secondary | ICD-10-CM | POA: Diagnosis present

## 2014-12-29 DIAGNOSIS — M1710 Unilateral primary osteoarthritis, unspecified knee: Secondary | ICD-10-CM | POA: Diagnosis present

## 2014-12-29 DIAGNOSIS — M25562 Pain in left knee: Secondary | ICD-10-CM | POA: Diagnosis present

## 2014-12-29 HISTORY — DX: Other intervertebral disc degeneration, lumbar region without mention of lumbar back pain or lower extremity pain: M51.369

## 2014-12-29 HISTORY — DX: Unspecified osteoarthritis, unspecified site: M19.90

## 2014-12-29 HISTORY — DX: Unilateral primary osteoarthritis, left knee: M17.12

## 2014-12-29 HISTORY — DX: Essential (primary) hypertension: I10

## 2014-12-29 HISTORY — DX: Calculus of kidney: N20.0

## 2014-12-29 HISTORY — DX: Other intervertebral disc degeneration, lumbar region: M51.36

## 2014-12-29 HISTORY — DX: Other intervertebral disc displacement, lumbar region: M51.26

## 2014-12-29 HISTORY — PX: TOTAL KNEE ARTHROPLASTY: SHX125

## 2014-12-29 SURGERY — ARTHROPLASTY, KNEE, TOTAL
Anesthesia: Monitor Anesthesia Care | Site: Knee | Laterality: Left

## 2014-12-29 MED ORDER — ACETAMINOPHEN 160 MG/5ML PO SOLN
325.0000 mg | ORAL | Status: DC | PRN
  Filled 2014-12-29: qty 20.3

## 2014-12-29 MED ORDER — MIDAZOLAM HCL 2 MG/2ML IJ SOLN
INTRAMUSCULAR | Status: AC
  Filled 2014-12-29: qty 2

## 2014-12-29 MED ORDER — FENTANYL CITRATE (PF) 100 MCG/2ML IJ SOLN
INTRAMUSCULAR | Status: DC | PRN
  Administered 2014-12-29 (×2): 50 ug via INTRAVENOUS

## 2014-12-29 MED ORDER — DEXAMETHASONE SODIUM PHOSPHATE 10 MG/ML IJ SOLN
10.0000 mg | Freq: Three times a day (TID) | INTRAMUSCULAR | Status: AC
  Administered 2014-12-29 – 2014-12-30 (×3): 10 mg via INTRAVENOUS
  Filled 2014-12-29 (×3): qty 1

## 2014-12-29 MED ORDER — MIDAZOLAM HCL 2 MG/2ML IJ SOLN
INTRAMUSCULAR | Status: AC
  Filled 2014-12-29: qty 4

## 2014-12-29 MED ORDER — ACETAMINOPHEN 650 MG RE SUPP: 650.0000 mg | Freq: Four times a day (QID) | RECTAL | Status: DC | PRN

## 2014-12-29 MED ORDER — FENTANYL CITRATE (PF) 100 MCG/2ML IJ SOLN
INTRAMUSCULAR | Status: AC
  Filled 2014-12-29: qty 2

## 2014-12-29 MED ORDER — PROPOFOL 500 MG/50ML IV EMUL
INTRAVENOUS | Status: DC | PRN
  Administered 2014-12-29: 50 ug/kg/min via INTRAVENOUS

## 2014-12-29 MED ORDER — LACTATED RINGERS IV SOLN
INTRAVENOUS | Status: DC
  Administered 2014-12-29 (×3): via INTRAVENOUS

## 2014-12-29 MED ORDER — CHLORHEXIDINE GLUCONATE 4 % EX LIQD: 60.0000 mL | Freq: Once | CUTANEOUS | Status: DC

## 2014-12-29 MED ORDER — METOCLOPRAMIDE HCL 5 MG/ML IJ SOLN: 5.0000 mg | Freq: Three times a day (TID) | INTRAMUSCULAR | Status: DC | PRN

## 2014-12-29 MED ORDER — LIDOCAINE HCL (CARDIAC) 20 MG/ML IV SOLN
INTRAVENOUS | Status: AC
  Filled 2014-12-29: qty 5

## 2014-12-29 MED ORDER — MENTHOL 3 MG MT LOZG: 1.0000 | LOZENGE | OROMUCOSAL | Status: DC | PRN

## 2014-12-29 MED ORDER — PROPOFOL 10 MG/ML IV BOLUS
INTRAVENOUS | Status: DC | PRN
  Administered 2014-12-29: 40 mg via INTRAVENOUS

## 2014-12-29 MED ORDER — OXYCODONE HCL 5 MG PO TABS: 5.0000 mg | ORAL_TABLET | Freq: Once | ORAL | Status: DC | PRN

## 2014-12-29 MED ORDER — APIXABAN 2.5 MG PO TABS
2.5000 mg | ORAL_TABLET | Freq: Two times a day (BID) | ORAL | Status: DC
  Administered 2014-12-30: 2.5 mg via ORAL
  Filled 2014-12-29: qty 1

## 2014-12-29 MED ORDER — DIPHENHYDRAMINE HCL 12.5 MG/5ML PO ELIX: 12.5000 mg | ORAL_SOLUTION | ORAL | Status: DC | PRN

## 2014-12-29 MED ORDER — LACTATED RINGERS IV SOLN: INTRAVENOUS | Status: DC

## 2014-12-29 MED ORDER — MIDAZOLAM HCL 5 MG/5ML IJ SOLN
INTRAMUSCULAR | Status: DC | PRN
  Administered 2014-12-29: 2 mg via INTRAVENOUS

## 2014-12-29 MED ORDER — OXYCODONE HCL 5 MG/5ML PO SOLN: 5.0000 mg | Freq: Once | ORAL | Status: DC | PRN

## 2014-12-29 MED ORDER — FENTANYL CITRATE (PF) 250 MCG/5ML IJ SOLN
INTRAMUSCULAR | Status: AC
Start: 2014-12-29 — End: 2014-12-29
  Filled 2014-12-29: qty 5

## 2014-12-29 MED ORDER — ACETAMINOPHEN 325 MG PO TABS: 325.0000 mg | ORAL_TABLET | ORAL | Status: DC | PRN

## 2014-12-29 MED ORDER — SODIUM CHLORIDE 0.9 % IR SOLN
Status: DC | PRN
  Administered 2014-12-29: 1000 mL

## 2014-12-29 MED ORDER — POLYETHYLENE GLYCOL 3350 17 G PO PACK
17.0000 g | PACK | Freq: Two times a day (BID) | ORAL | Status: DC
  Administered 2014-12-29 – 2014-12-30 (×2): 17 g via ORAL
  Filled 2014-12-29 (×2): qty 1

## 2014-12-29 MED ORDER — ONDANSETRON HCL 4 MG PO TABS
4.0000 mg | ORAL_TABLET | Freq: Four times a day (QID) | ORAL | Status: DC | PRN
  Administered 2014-12-30: 4 mg via ORAL
  Filled 2014-12-29: qty 1

## 2014-12-29 MED ORDER — DEXTROSE 5 % IV SOLN
3.0000 g | Freq: Four times a day (QID) | INTRAVENOUS | Status: AC
  Administered 2014-12-29 (×2): 3 g via INTRAVENOUS
  Filled 2014-12-29 (×2): qty 3000

## 2014-12-29 MED ORDER — PHENOL 1.4 % MT LIQD: 1.0000 | OROMUCOSAL | Status: DC | PRN

## 2014-12-29 MED ORDER — CEFUROXIME SODIUM 1.5 G IJ SOLR
INTRAMUSCULAR | Status: AC
  Filled 2014-12-29: qty 1.5

## 2014-12-29 MED ORDER — BUPIVACAINE-EPINEPHRINE 0.25% -1:200000 IJ SOLN
INTRAMUSCULAR | Status: DC | PRN
  Administered 2014-12-29: 30 mL

## 2014-12-29 MED ORDER — ONDANSETRON HCL 4 MG/2ML IJ SOLN: 4.0000 mg | Freq: Four times a day (QID) | INTRAMUSCULAR | Status: DC | PRN

## 2014-12-29 MED ORDER — LOSARTAN POTASSIUM-HCTZ 100-12.5 MG PO TABS: 1.0000 | ORAL_TABLET | Freq: Every day | ORAL | Status: DC

## 2014-12-29 MED ORDER — LOSARTAN POTASSIUM 50 MG PO TABS
100.0000 mg | ORAL_TABLET | Freq: Every day | ORAL | Status: DC
  Administered 2014-12-29 – 2014-12-30 (×2): 100 mg via ORAL
  Filled 2014-12-29 (×2): qty 2

## 2014-12-29 MED ORDER — ROCURONIUM BROMIDE 50 MG/5ML IV SOLN
INTRAVENOUS | Status: AC
  Filled 2014-12-29: qty 1

## 2014-12-29 MED ORDER — DEXAMETHASONE SODIUM PHOSPHATE 10 MG/ML IJ SOLN
INTRAMUSCULAR | Status: DC | PRN
  Administered 2014-12-29: 10 mg via INTRAVENOUS
  Administered 2014-12-29: 15 mg via INTRAVENOUS
  Administered 2014-12-29: 10 mg via INTRAVENOUS

## 2014-12-29 MED ORDER — DOCUSATE SODIUM 100 MG PO CAPS
100.0000 mg | ORAL_CAPSULE | Freq: Two times a day (BID) | ORAL | Status: DC
  Administered 2014-12-29 – 2014-12-30 (×2): 100 mg via ORAL
  Filled 2014-12-29 (×2): qty 1

## 2014-12-29 MED ORDER — HYDROMORPHONE HCL 1 MG/ML IJ SOLN
1.0000 mg | INTRAMUSCULAR | Status: DC | PRN
  Administered 2014-12-29 – 2014-12-30 (×3): 1 mg via INTRAVENOUS
  Filled 2014-12-29 (×3): qty 1

## 2014-12-29 MED ORDER — ACETAMINOPHEN 325 MG PO TABS: 650.0000 mg | ORAL_TABLET | Freq: Four times a day (QID) | ORAL | Status: DC | PRN

## 2014-12-29 MED ORDER — BUPIVACAINE-EPINEPHRINE (PF) 0.5% -1:200000 IJ SOLN
INTRAMUSCULAR | Status: DC | PRN
  Administered 2014-12-29: 15 mL via PERINEURAL

## 2014-12-29 MED ORDER — HYDROCHLOROTHIAZIDE 12.5 MG PO CAPS
12.5000 mg | ORAL_CAPSULE | Freq: Every day | ORAL | Status: DC
  Administered 2014-12-29 – 2014-12-30 (×2): 12.5 mg via ORAL
  Filled 2014-12-29 (×2): qty 1

## 2014-12-29 MED ORDER — OXYCODONE HCL 5 MG PO TABS
5.0000 mg | ORAL_TABLET | ORAL | Status: DC | PRN
  Administered 2014-12-29 – 2014-12-30 (×7): 10 mg via ORAL
  Filled 2014-12-29 (×6): qty 2

## 2014-12-29 MED ORDER — METOCLOPRAMIDE HCL 5 MG PO TABS: 5.0000 mg | ORAL_TABLET | Freq: Three times a day (TID) | ORAL | Status: DC | PRN

## 2014-12-29 MED ORDER — HYDROMORPHONE HCL 1 MG/ML IJ SOLN: 0.2500 mg | INTRAMUSCULAR | Status: DC | PRN

## 2014-12-29 MED ORDER — POTASSIUM CHLORIDE IN NACL 20-0.9 MEQ/L-% IV SOLN
INTRAVENOUS | Status: DC
  Administered 2014-12-29: 100 mL/h via INTRAVENOUS
  Filled 2014-12-29: qty 1000

## 2014-12-29 MED ORDER — POVIDONE-IODINE 7.5 % EX SOLN
Freq: Once | CUTANEOUS | Status: DC
  Filled 2014-12-29: qty 118

## 2014-12-29 MED ORDER — OXYCODONE HCL 5 MG PO TABS
ORAL_TABLET | ORAL | Status: AC
  Filled 2014-12-29: qty 2

## 2014-12-29 MED ORDER — BUPIVACAINE-EPINEPHRINE (PF) 0.25% -1:200000 IJ SOLN
INTRAMUSCULAR | Status: AC
  Filled 2014-12-29: qty 30

## 2014-12-29 MED ORDER — ALUM & MAG HYDROXIDE-SIMETH 200-200-20 MG/5ML PO SUSP: 30.0000 mL | ORAL | Status: DC | PRN

## 2014-12-29 MED ORDER — CELECOXIB 200 MG PO CAPS
200.0000 mg | ORAL_CAPSULE | Freq: Two times a day (BID) | ORAL | Status: DC
  Administered 2014-12-29 – 2014-12-30 (×3): 200 mg via ORAL
  Filled 2014-12-29 (×3): qty 1

## 2014-12-29 MED ORDER — BUPIVACAINE IN DEXTROSE 0.75-8.25 % IT SOLN
INTRATHECAL | Status: DC | PRN
  Administered 2014-12-29: 2 mL via INTRATHECAL

## 2014-12-29 SURGICAL SUPPLY — 86 items
APL SKNCLS STERI-STRIP NONHPOA (GAUZE/BANDAGES/DRESSINGS) ×1
BANDAGE ESMARK 6X9 LF (GAUZE/BANDAGES/DRESSINGS) ×1 IMPLANT
BENZOIN TINCTURE PRP APPL 2/3 (GAUZE/BANDAGES/DRESSINGS) ×2 IMPLANT
BLADE SAGITTAL 25.0X1.19X90 (BLADE) ×2 IMPLANT
BLADE SAW SGTL 11.0X1.19X90.0M (BLADE) IMPLANT
BLADE SAW SGTL 13.0X1.19X90.0M (BLADE) ×2 IMPLANT
BLADE SURG 10 STRL SS (BLADE) ×4 IMPLANT
BNDG CMPR 9X6 STRL LF SNTH (GAUZE/BANDAGES/DRESSINGS) ×1
BNDG CMPR MED 10X6 ELC LF (GAUZE/BANDAGES/DRESSINGS) ×1
BNDG CMPR MED 15X6 ELC VLCR LF (GAUZE/BANDAGES/DRESSINGS) ×1
BNDG ELASTIC 6X10 VLCR STRL LF (GAUZE/BANDAGES/DRESSINGS) ×1 IMPLANT
BNDG ELASTIC 6X15 VLCR STRL LF (GAUZE/BANDAGES/DRESSINGS) ×2 IMPLANT
BNDG ESMARK 6X9 LF (GAUZE/BANDAGES/DRESSINGS) ×2
BOWL SMART MIX CTS (DISPOSABLE) ×2 IMPLANT
CAPT KNEE TOTAL 3 ATTUNE ×1 IMPLANT
CEMENT HV SMART SET (Cement) ×4 IMPLANT
COVER SURGICAL LIGHT HANDLE (MISCELLANEOUS) ×2 IMPLANT
CUFF TOURNIQUET SINGLE 34IN LL (TOURNIQUET CUFF) ×2 IMPLANT
CUFF TOURNIQUET SINGLE 44IN (TOURNIQUET CUFF) IMPLANT
DECANTER SPIKE VIAL GLASS SM (MISCELLANEOUS) ×2 IMPLANT
DRAPE EXTREMITY T 121X128X90 (DRAPE) ×2 IMPLANT
DRAPE INCISE IOBAN 66X45 STRL (DRAPES) ×2 IMPLANT
DRAPE PROXIMA HALF (DRAPES) ×2 IMPLANT
DRAPE U-SHAPE 47X51 STRL (DRAPES) ×2 IMPLANT
DRSG AQUACEL AG ADV 3.5X14 (GAUZE/BANDAGES/DRESSINGS) ×2 IMPLANT
DRSG PAD ABDOMINAL 8X10 ST (GAUZE/BANDAGES/DRESSINGS) ×4 IMPLANT
DURAPREP 26ML APPLICATOR (WOUND CARE) ×4 IMPLANT
ELECT CAUTERY BLADE 6.4 (BLADE) ×2 IMPLANT
ELECT REM PT RETURN 9FT ADLT (ELECTROSURGICAL) ×2
ELECTRODE REM PT RTRN 9FT ADLT (ELECTROSURGICAL) ×1 IMPLANT
EVACUATOR 1/8 PVC DRAIN (DRAIN) ×2 IMPLANT
FACESHIELD WRAPAROUND (MASK) ×4 IMPLANT
FACESHIELD WRAPAROUND OR TEAM (MASK) ×1 IMPLANT
GAUZE SPONGE 4X4 12PLY STRL (GAUZE/BANDAGES/DRESSINGS) ×2 IMPLANT
GLOVE BIO SURGEON STRL SZ 6.5 (GLOVE) ×1 IMPLANT
GLOVE BIO SURGEON STRL SZ7 (GLOVE) ×2 IMPLANT
GLOVE BIO SURGEON STRL SZ8.5 (GLOVE) ×1 IMPLANT
GLOVE BIOGEL PI IND STRL 6.5 (GLOVE) IMPLANT
GLOVE BIOGEL PI IND STRL 7.0 (GLOVE) ×1 IMPLANT
GLOVE BIOGEL PI IND STRL 7.5 (GLOVE) ×1 IMPLANT
GLOVE BIOGEL PI IND STRL 8.5 (GLOVE) IMPLANT
GLOVE BIOGEL PI INDICATOR 6.5 (GLOVE) ×1
GLOVE BIOGEL PI INDICATOR 7.0 (GLOVE) ×1
GLOVE BIOGEL PI INDICATOR 7.5 (GLOVE) ×1
GLOVE BIOGEL PI INDICATOR 8.5 (GLOVE) ×1
GLOVE SS BIOGEL STRL SZ 7.5 (GLOVE) ×1 IMPLANT
GLOVE SUPERSENSE BIOGEL SZ 7.5 (GLOVE) ×1
GOWN STRL REUS W/ TWL LRG LVL3 (GOWN DISPOSABLE) ×1 IMPLANT
GOWN STRL REUS W/ TWL XL LVL3 (GOWN DISPOSABLE) ×2 IMPLANT
GOWN STRL REUS W/TWL LRG LVL3 (GOWN DISPOSABLE) ×2
GOWN STRL REUS W/TWL XL LVL3 (GOWN DISPOSABLE) ×4
HANDPIECE INTERPULSE COAX TIP (DISPOSABLE) ×2
HOOD PEEL AWAY FACE SHEILD DIS (HOOD) ×4 IMPLANT
IMMOBILIZER KNEE 22 UNIV (SOFTGOODS) ×2 IMPLANT
KIT BASIN OR (CUSTOM PROCEDURE TRAY) ×2 IMPLANT
KIT ROOM TURNOVER OR (KITS) ×2 IMPLANT
MANIFOLD NEPTUNE II (INSTRUMENTS) ×2 IMPLANT
MARKER SKIN DUAL TIP RULER LAB (MISCELLANEOUS) ×2 IMPLANT
NDL 18GX1X1/2 (RX/OR ONLY) (NEEDLE) IMPLANT
NEEDLE 18GX1X1/2 (RX/OR ONLY) (NEEDLE) ×2 IMPLANT
NS IRRIG 1000ML POUR BTL (IV SOLUTION) ×2 IMPLANT
PACK TOTAL JOINT (CUSTOM PROCEDURE TRAY) ×2 IMPLANT
PACK UNIVERSAL I (CUSTOM PROCEDURE TRAY) ×2 IMPLANT
PAD ABD 8X10 STRL (GAUZE/BANDAGES/DRESSINGS) ×2 IMPLANT
PAD ARMBOARD 7.5X6 YLW CONV (MISCELLANEOUS) ×4 IMPLANT
PADDING CAST ABS 6INX4YD NS (CAST SUPPLIES) ×1
PADDING CAST ABS COTTON 6X4 NS (CAST SUPPLIES) IMPLANT
PADDING CAST COTTON 6X4 STRL (CAST SUPPLIES) ×2 IMPLANT
RUBBERBAND STERILE (MISCELLANEOUS) ×2 IMPLANT
SET HNDPC FAN SPRY TIP SCT (DISPOSABLE) ×1 IMPLANT
SPONGE GAUZE 4X4 12PLY STER LF (GAUZE/BANDAGES/DRESSINGS) ×1 IMPLANT
STRIP CLOSURE SKIN 1/2X4 (GAUZE/BANDAGES/DRESSINGS) ×2 IMPLANT
SUCTION FRAZIER TIP 10 FR DISP (SUCTIONS) ×2 IMPLANT
SUT MNCRL AB 3-0 PS2 18 (SUTURE) ×2 IMPLANT
SUT VIC AB 0 CT1 27 (SUTURE) ×4
SUT VIC AB 0 CT1 27XBRD ANBCTR (SUTURE) ×2 IMPLANT
SUT VIC AB 1 CT1 27 (SUTURE) ×2
SUT VIC AB 1 CT1 27XBRD ANBCTR (SUTURE) ×1 IMPLANT
SUT VIC AB 2-0 CT1 27 (SUTURE) ×4
SUT VIC AB 2-0 CT1 TAPERPNT 27 (SUTURE) ×2 IMPLANT
SYR 30ML SLIP (SYRINGE) ×2 IMPLANT
TOWEL OR 17X24 6PK STRL BLUE (TOWEL DISPOSABLE) ×2 IMPLANT
TOWEL OR 17X26 10 PK STRL BLUE (TOWEL DISPOSABLE) ×2 IMPLANT
TRAY FOLEY CATH 16FR SILVER (SET/KITS/TRAYS/PACK) ×2 IMPLANT
TUBE CONNECTING 12X1/4 (SUCTIONS) ×2 IMPLANT
YANKAUER SUCT BULB TIP NO VENT (SUCTIONS) ×2 IMPLANT

## 2014-12-29 NOTE — Progress Notes (Signed)
Utilization review completed.  

## 2014-12-29 NOTE — Interval H&P Note (Signed)
History and Physical Interval Note:  12/29/2014 10:50 AM  Adam Avila  has presented today for surgery, with the diagnosis of primary localized OA left knee  The various methods of treatment have been discussed with the patient and family. After consideration of risks, benefits and other options for treatment, the patient has consented to  Procedure(s): LEFT TOTAL KNEE ARTHROPLASTY (Left) as a surgical intervention .  The patient's history has been reviewed, patient examined, no change in status, stable for surgery.  I have reviewed the patient's chart and labs.  Questions were answered to the patient's satisfaction.     [name]

## 2014-12-29 NOTE — Transfer of Care (Signed)
Immediate Anesthesia Transfer of Care Note  Patient: Adam Avila  Procedure(s) Performed: Procedure(s): LEFT TOTAL KNEE ARTHROPLASTY (Left)  Patient Location: PACU  Anesthesia Type:MAC and Spinal  Level of Consciousness: awake, alert  and oriented  Airway & Oxygen Therapy: Patient Spontanous Breathing  Post-op Assessment: Report given to RN, Post -op Vital signs reviewed and stable and Patient moving all extremities  Post vital signs: Reviewed and stable  Last Vitals:  Filed Vitals:   12/29/14 0925  BP: 131/95  Pulse: 65  Temp: 36.8 C  Resp: 20    Complications: No apparent anesthesia complications

## 2014-12-29 NOTE — Anesthesia Preprocedure Evaluation (Addendum)
Anesthesia Evaluation  Patient identified by MRN, date of birth, ID band Patient awake    Reviewed: Allergy & Precautions, NPO status , Patient's Chart, lab work & pertinent test results  History of Anesthesia Complications Negative for: history of anesthetic complications  Airway Mallampati: II  TM Distance: >3 FB Neck ROM: Full    Dental  (+) Teeth Intact   Pulmonary neg pulmonary ROS,    breath sounds clear to auscultation       Cardiovascular hypertension, Pt. on medications (-) angina(-) Past MI and (-) CHF  Rhythm:Regular     Neuro/Psych PSYCHIATRIC DISORDERS Anxiety negative neurological ROS     GI/Hepatic negative GI ROS, Neg liver ROS,   Endo/Other  Morbid obesity  Renal/GU negative Renal ROS     Musculoskeletal  (+) Arthritis ,   Abdominal   Peds  Hematology  (+) anemia ,   Anesthesia Other Findings   Reproductive/Obstetrics                           Anesthesia Physical Anesthesia Plan  ASA: II  Anesthesia Plan: MAC, Regional and Spinal   Post-op Pain Management:    Induction:   Airway Management Planned: Simple Face Mask  Additional Equipment: None  Intra-op Plan:   Post-operative Plan:   Informed Consent:   Dental advisory given  Plan Discussed with: Anesthesiologist and Surgeon  Anesthesia Plan Comments:        Anesthesia Quick Evaluation

## 2014-12-29 NOTE — Anesthesia Procedure Notes (Addendum)
Procedure Name: MAC Date/Time: 12/29/2014 11:10 AM Performed by: Trixie Deis A Pre-anesthesia Checklist: Patient identified, Emergency Drugs available, Suction available, Patient being monitored and Timeout performed Patient Re-evaluated:Patient Re-evaluated prior to inductionOxygen Delivery Method: Simple face mask Placement Confirmation: positive ETCO2 Dental Injury: Teeth and Oropharynx as per pre-operative assessment     Spinal Patient location during procedure: OR Staffing Anesthesiologist: Nga Rabon Preanesthetic Checklist Completed: patient identified, surgical consent, pre-op evaluation, timeout performed, IV checked, risks and benefits discussed and monitors and equipment checked Spinal Block Patient position: sitting Prep: site prepped and draped and DuraPrep Patient monitoring: heart rate, cardiac monitor, continuous pulse ox and blood pressure Approach: midline Location: L3-4 Injection technique: single-shot Needle Needle type: Pencan  Needle gauge: 24 G Needle length: 10 cm Assessment Sensory level: T6  Anesthesia Regional Block:  Adductor canal block  Pre-Anesthetic Checklist: ,, timeout performed, Correct Patient, Correct Site, Correct Laterality, Correct Procedure, Correct Position, site marked, Risks and benefits discussed,  Surgical consent,  Pre-op evaluation,  At surgeon's request and post-op pain management  Laterality: Lower and Left  Prep: chloraprep       Needles:  Injection technique: Single-shot  Needle Type: Echogenic Stimulator Needle          Additional Needles:  Procedures: ultrasound guided (picture in chart) Adductor canal block Narrative:  Injection made incrementally with aspirations every 5 mL.  Performed by: Personally  Anesthesiologist: Aakash Hollomon  Additional Notes: H+P and labs reviewed, risks and benefits discussed with patient, procedure tolerated well without complications

## 2014-12-29 NOTE — Op Note (Signed)
MRN:     [id number] DOB/AGE:    [birth year] / 54 y.o.       OPERATIVE REPORT    DATE OF PROCEDURE:  12/29/2014       PREOPERATIVE DIAGNOSIS:   Primary localized OA left knee      Estimated body mass index is 39.17 kg/(m^2) as calculated from the following:   Height as of this encounter: 5\' 10"  (1.778 m).   Weight as of this encounter: 123.832 kg (273 lb).                                                        POSTOPERATIVE DIAGNOSIS:   same                                                                      PROCEDURE:  Procedure(s): LEFT TOTAL KNEE ARTHROPLASTY Using Depuy Attune RP implants #6 Femur, #7Tibia, 42mm attune RP bearing, 35 Patella     SURGEON: Delshon Blanchfield,[place] A    ASSISTANT:  Kirstin Shepperson PA-C   (Present and scrubbed throughout the case, critical for assistance with exposure, retraction, instrumentation, and closure.)         ANESTHESIA: Spinal with Femoral Nerve Block  DRAINS: foley, 2 medium hemovac in knee   TOURNIQUET TIME: [id number]   COMPLICATIONS:  None     SPECIMENS: None   INDICATIONS FOR PROCEDURE: The patient has  djd left knee, varus deformities, XR shows bone on bone arthritis. Patient has failed all conservative measures including anti-inflammatory medicines, narcotics, attempts at  exercise and weight loss, cortisone injections and viscosupplementation.  Risks and benefits of surgery have been discussed, questions answered.   DESCRIPTION OF PROCEDURE: The patient identified by armband, received  right femoral nerve block and IV antibiotics, in the holding area at [facility]. Patient taken to the operating room, appropriate anesthetic  monitors were attached General endotracheal anesthesia induced with  the patient in supine position, Foley catheter was inserted. Tourniquet  applied high to the operative thigh. Lateral post and foot positioner  applied to the table, the lower extremity was then prepped and draped  in usual sterile fashion from  the ankle to the tourniquet. Time-out procedure was performed. The limb was wrapped with an Esmarch bandage and the tourniquet inflated to 365 mmHg. We began the operation by making the anterior midline incision starting at handbreadth above the patella going over the patella 1 cm medial to and  4 cm distal to the tibial tubercle. Small bleeders in the skin and the  subcutaneous tissue identified and cauterized. Transverse retinaculum was incised and reflected medially and a medial parapatellar arthrotomy was accomplished. the patella was everted and theprepatellar fat pad resected. The superficial medial collateral  ligament was then elevated from anterior to posterior along the proximal  flare of the tibia and anterior half of the menisci resected. The knee was hyperflexed exposing bone on bone arthritis. Peripheral and notch osteophytes as well as the cruciate ligaments were then resected. We continued to  work our way around posteriorly along the proximal tibia, and  externally  rotated the tibia subluxing it out from underneath the femur. A McHale  retractor was placed through the notch and a lateral Hohmann retractor  placed, and we then drilled through the proximal tibia in line with the  axis of the tibia followed by an intramedullary guide rod and 2-degree  posterior slope cutting guide. The tibial cutting guide was pinned into place  allowing resection of 6 mm of bone medially and about 4 mm of bone  laterally because of her valgus deformity. Satisfied with the tibial resection, we then  entered the distal femur 2 mm anterior to the PCL origin with the  intramedullary guide rod and applied the distal femoral cutting guide  set at 74mm, with 5 degrees of valgus. This was pinned along the  epicondylar axis. At this point, the distal femoral cut was accomplished without difficulty. We then sized for a #6 femoral component and pinned the guide in 3 degrees of external rotation.The chamfer  cutting guide was pinned into place. The anterior, posterior, and chamfer cuts were accomplished without difficulty followed by  The Attune RP box cutting guide and the box cut. We also removed posterior osteophytes from the posterior femoral condyles. At this  time, the knee was brought into full extension. We checked our  extension and flexion gaps and found them symmetric at 33mm.  The patella thickness measured at 25 mm. We set the cutting guide at 15 and removed the posterior 9.5-10 mm  of the patella sized for 35 button and drilled the lollipop. The knee  was then once again hyperflexed exposing the proximal tibia. We sized for a #7 tibial base plate, applied the smokestack and the conical reamer followed by the the Delta fin keel punch. We then hammered into place the Attune RP trial femoral component, inserted a 6-mm trial bearing, trial patellar button, and took the knee through range of motion from 0-130 degrees. No thumb pressure was required for patellar  tracking. At this point, all trial components were removed, a double batch of DePuy HV cement  was mixed and applied to all bony metallic mating surfaces except for the posterior condyles of the femur itself. In order, we  hammered into place the tibial tray and removed excess cement, the femoral component and removed excess cement, a 6-mm Attune RP bearing  was inserted, and the knee brought to full extension with compression.  The patellar button was clamped into place, and excess cement  removed. While the cement cured the wound was irrigated out with normal saline solution pulse lavage, and medium Hemovac drains were placed.. Ligament stability and patellar tracking were checked and found to be excellent. The tourniquet was then released and hemostasis was obtained with cautery. The parapatellar arthrotomy was closed with  #1 ethibond suture. The subcutaneous tissue with 0 and 2-0 undyed  Vicryl suture, and 4-0 Monocryl.. A dressing of  Xeroform,  4 x 4, dressing sponges, Webril, and Ace wrap applied. Needle and sponge count were correct times 2.The patient awakened, extubated, and taken to recovery room without difficulty. Vascular status was normal, pulses 2+ and symmetric.   Sibel Khurana,Haydan A 12/29/2014, 12:50 PM

## 2014-12-29 NOTE — Evaluation (Signed)
Physical Therapy Evaluation Patient Details Name: Adam Avila MRN: [id number] DOB: 04/03/1960 Today's Date: 12/29/2014   History of Present Illness  s/p Lt TKR  PMHx- HTN, DDD, HNP  Clinical Impression  Pt is s/p TKA resulting in the deficits listed below (see PT Problem List).  Pt will benefit from skilled PT to increase their independence and safety with mobility to allow discharge to the venue listed below. Anticipate will be ready to d/c home 11/15 after stair training.     Follow Up Recommendations Home health PT    Equipment Recommendations  None recommended by PT (pt to borrow crutches for stairs)    Recommendations for Other Services       Precautions / Restrictions Precautions Precautions: Knee Required Braces or Orthoses: Knee Immobilizer - Left Knee Immobilizer - Left: Discontinue once straight leg raise with < 10 degree lag Restrictions Weight Bearing Restrictions: Yes LLE Weight Bearing: Weight bearing as tolerated      Mobility  Bed Mobility Overal bed mobility: Modified Independent             General bed mobility comments: no rail, HOB 0  Transfers Overall transfer level: Needs assistance Equipment used: Rolling walker (2 wheeled) Transfers: Sit to/from Stand Sit to Stand: Supervision         General transfer comment: supervision for safety; correct technique  Ambulation/Gait Ambulation/Gait assistance: Min guard;Supervision Ambulation Distance (Feet): 200 Feet Assistive device: Rolling walker (2 wheeled) Gait Pattern/deviations: Step-through pattern;Decreased stride length   Gait velocity interpretation: Below normal speed for age/gender General Gait Details: LLE hip hike due to knee immobilizer; vc for use of RW  Stairs            Wheelchair Mobility    Modified Rankin (Stroke Patients Only)       Balance                                             Pertinent Vitals/Pain Pain Assessment: 0-10 Pain  Score: 5  Pain Location: Lt knee Pain Descriptors / Indicators: Aching Pain Intervention(s): Limited activity within patient's tolerance;Monitored during session;Premedicated before session;Repositioned;Patient requesting pain meds-RN notified;Ice applied    Home Living Family/patient expects to be discharged to:: Private residence Living Arrangements: Spouse/significant other Available Help at Discharge: Family Type of Home: House Home Access: Stairs to enter Entrance Stairs-Rails: Right;Left (not super stable) Entrance Stairs-Number of Steps: 5 Home Layout: Two level;1/2 bath on main level;Bed/bath upstairs (has moved twin bed downstairs) Home Equipment: Walker - 2 wheels;Bedside commode;Other (comment) (CPM; can borrow crutches)      Prior Function Level of Independence: Independent         Comments: works in Biochemist, clinical ed (Counsellor)     Journalist, newspaper        Extremity/Trunk Assessment   Upper Extremity Assessment: Overall WFL for tasks assessed           Lower Extremity Assessment: LLE deficits/detail   LLE Deficits / Details: Full knee extension; limited flexion due to pain  Cervical / Trunk Assessment: Normal  Communication   Communication: No difficulties  Cognition Arousal/Alertness: Awake/alert Behavior During Therapy: WFL for tasks assessed/performed Overall Cognitive Status: Within Functional Limits for tasks assessed                      General Comments General comments (skin integrity, edema, etc.):  Educated on no pillow under knee    Exercises Total Joint Exercises Ankle Circles/Pumps: AROM;Both;10 reps Quad Sets: AROM;Left;5 reps      Assessment/Plan    PT Assessment Patient needs continued PT services  PT Diagnosis Difficulty walking;Acute pain   PT Problem List Decreased range of motion;Decreased mobility;Decreased knowledge of use of DME;Decreased knowledge of precautions;Pain;Obesity  PT Treatment Interventions DME  instruction;Gait training;Stair training;Functional mobility training;Therapeutic activities;Therapeutic exercise;Patient/family education   PT Goals (Current goals can be found in the Care Plan section) Acute Rehab PT Goals Patient Stated Goal: go home 11/15 PT Goal Formulation: With patient Time For Goal Achievement: 12/31/14 Potential to Achieve Goals: Good    Frequency 7X/week   Barriers to discharge        Co-evaluation               End of Session   Activity Tolerance: Patient tolerated treatment well Patient left: in chair;with call bell/phone within reach;with family/visitor present Nurse Communication: Mobility status;Patient requests pain meds         Time: [id number] PT Time Calculation (min) (ACUTE ONLY): 37 min   Charges:   PT Evaluation $Initial PT Evaluation Tier I: 1 Procedure PT Treatments $Gait Training: 8-22 mins   PT G Codes:        Adam Avila Jan 01, 2015, 4:05 PM Pager [phone]

## 2014-12-29 NOTE — Progress Notes (Signed)
Orthopedic Tech Progress Note Patient Details:  Adam Avila 17-Jun-1960 [id number]  CPM Left Knee CPM Left Knee: On Left Knee Flexion (Degrees): 90 Left Knee Extension (Degrees): 0 Additional Comments: Trapeze bar and foot roll   [place] Pink 12/29/2014, 2:25 PM

## 2014-12-30 ENCOUNTER — Encounter (HOSPITAL_COMMUNITY): Payer: Self-pay | Admitting: Orthopedic Surgery

## 2014-12-30 LAB — BASIC METABOLIC PANEL
ANION GAP: 10 (ref 5–15)
BUN: 16 mg/dL (ref 6–20)
CALCIUM: 9 mg/dL (ref 8.9–10.3)
CHLORIDE: 101 mmol/L (ref 101–111)
CO2: 25 mmol/L (ref 22–32)
CREATININE: 0.84 mg/dL (ref 0.61–1.24)
GFR calc non Af Amer: >60 mL/min (ref >60)
GLUCOSE: 135 mg/dL — AB (ref 65–99)
Potassium: 4.6 mmol/L (ref 3.5–5.1)
Sodium: 136 mmol/L (ref 135–145)

## 2014-12-30 LAB — CBC
HEMATOCRIT: 39.8 % (ref 39.0–52.0)
HEMOGLOBIN: 14 g/dL (ref 13.0–17.0)
MCH: 31.9 pg (ref 26.0–34.0)
MCHC: 35.2 g/dL (ref 30.0–36.0)
MCV: 90.7 fL (ref 78.0–100.0)
Platelets: 228 10*3/uL (ref 150–400)
RBC: 4.39 MIL/uL (ref 4.22–5.81)
RDW: 12.5 % (ref 11.5–15.5)
WBC: 17.6 10*3/uL — ABNORMAL HIGH (ref 4.0–10.5)

## 2014-12-30 MED ORDER — OXYCODONE HCL ER 20 MG PO T12A: EXTENDED_RELEASE_TABLET | ORAL | Status: DC

## 2014-12-30 MED ORDER — POLYETHYLENE GLYCOL 3350 17 G PO PACK: PACK | ORAL | Status: DC

## 2014-12-30 MED ORDER — OXYCODONE HCL 5 MG PO TABS
ORAL_TABLET | ORAL | Status: DC
Start: 2014-12-30 — End: 2020-12-16

## 2014-12-30 MED ORDER — CELECOXIB 200 MG PO CAPS: ORAL_CAPSULE | ORAL | Status: DC

## 2014-12-30 MED ORDER — DOCUSATE SODIUM 100 MG PO CAPS: ORAL_CAPSULE | ORAL | Status: DC

## 2014-12-30 MED ORDER — APIXABAN 2.5 MG PO TABS: 2.5000 mg | ORAL_TABLET | Freq: Two times a day (BID) | ORAL | Status: DC

## 2014-12-30 NOTE — Progress Notes (Signed)
Physical Therapy Treatment Patient Details Name: Adam Avila MRN: [id number] DOB: [birth year] Today's Date: 12/30/2014    History of Present Illness s/p Lt TKR  PMHx- HTN, DDD, HNP    PT Comments    Patient is making good progress with PT.  From a mobility standpoint anticipate patient will be ready for DC home following second PT session. Will review HEP and ensure confidence with gait/stairs.   Follow Up Recommendations  Home health PT     Equipment Recommendations  None recommended by PT    Recommendations for Other Services       Precautions / Restrictions Precautions Precautions: Knee Knee Immobilizer - Left: Other (comment) (able to perform SLR with <10 degrees lag) Restrictions Weight Bearing Restrictions: Yes LLE Weight Bearing: Weight bearing as tolerated    Mobility  Bed Mobility Overal bed mobility: Independent Bed Mobility: Supine to Sit     Supine to sit: Independent     General bed mobility comments: HOB flat, no rail  Transfers Overall transfer level: Needs assistance Equipment used: Rolling walker (2 wheeled) Transfers: Sit to/from Stand Sit to Stand: Supervision         General transfer comment: cues for hand position  Ambulation/Gait Ambulation/Gait assistance: Supervision Ambulation Distance (Feet): 400 Feet Assistive device: Rolling walker (2 wheeled) Gait Pattern/deviations: Step-through pattern;Decreased dorsiflexion - left Gait velocity: decreased   General Gait Details: cues for even strides   Stairs Stairs: Yes Stairs assistance: Min guard Stair Management: Step to pattern;One rail Right;Sideways Number of Stairs: 4 General stair comments: Patient reports feeling confident, spouse observing.  Wheelchair Mobility    Modified Rankin (Stroke Patients Only)       Balance Overall balance assessment: Needs assistance Sitting-balance support: No upper extremity supported Sitting balance-Leahy Scale: Good      Standing balance support: During functional activity Standing balance-Leahy Scale: Fair Standing balance comment: using rw                    Cognition Arousal/Alertness: Awake/alert Behavior During Therapy: WFL for tasks assessed/performed Overall Cognitive Status: Within Functional Limits for tasks assessed                      Exercises Total Joint Exercises Ankle Circles/Pumps: AROM;Both;10 reps Quad Sets: Strengthening;Left;10 reps Gluteal Sets: Strengthening;Both;10 reps Heel Slides: AAROM;Left;10 reps    General Comments        Pertinent Vitals/Pain Pain Assessment: 0-10 Pain Score: 5  Pain Location: Lt knee Pain Descriptors / Indicators: Aching Pain Intervention(s): Limited activity within patient's tolerance;Monitored during session    Home Living                      Prior Function            PT Goals (current goals can now be found in the care plan section) Acute Rehab PT Goals Patient Stated Goal: go home today PT Goal Formulation: With patient Time For Goal Achievement: 12/31/14 Potential to Achieve Goals: Good Progress towards PT goals: Progressing toward goals    Frequency  7X/week    PT Plan Current plan remains appropriate    Co-evaluation             End of Session Equipment Utilized During Treatment: Gait belt Activity Tolerance: Patient tolerated treatment well Patient left: in chair;with call bell/phone within reach;with family/visitor present;Other (comment) (in knee extension stretch)     Time: [id number] PT Time Calculation (min) (ACUTE ONLY):  25 min  Charges:  $Gait Training: 23-37 mins                    G Codes:      Cassell Clement, PT, CSCS Pager [phone] Office [phone]  12/30/2014, 11:40 AM

## 2014-12-30 NOTE — Evaluation (Signed)
Occupational Therapy Evaluation Patient Details Name: Adam Avila MRN: [id number] DOB: 1961/01/22 Today's Date: 12/30/2014    History of Present Illness s/p Lt TKR  PMHx- HTN, DDD, HNP   Clinical Impression   Pt is performing ADL at a minimum assist to supervision level. All education completed as detailed below.  No further OT needs.    Follow Up Recommendations  No OT follow up    Equipment Recommendations  None recommended by OT    Recommendations for Other Services       Precautions / Restrictions Precautions Precautions: Knee Restrictions Weight Bearing Restrictions: Yes LLE Weight Bearing: Weight bearing as tolerated      Mobility Bed Mobility      General bed mobility comments: pt in chair  Transfers Overall transfer level: Needs assistance Equipment used: Rolling walker (2 wheeled) Transfers: Sit to/from Stand Sit to Stand: Modified independent (Device/Increase time)             Balance                             ADL Overall ADL's : Needs assistance/impaired Eating/Feeding: Independent   Grooming: Wash/dry hands;Standing;Supervision/safety   Upper Body Bathing: Set up;Sitting   Lower Body Bathing: Supervison/ safety;Sit to/from stand;With adaptive equipment Lower Body Bathing Details (indicate cue type and reason): recommended long bath sponge, educated in use of 3 in 1 for shower seat Upper Body Dressing : Set up;Standing   Lower Body Dressing: Minimal assistance;Sit to/from stand Lower Body Dressing Details (indicate cue type and reason): educated in use of AE and to dress L LE first Toilet Transfer: Supervision/safety;Ambulation;RW Toilet Transfer Details (indicate cue type and reason): pt aware of use of 3 in 1 over toilet Toileting- Clothing Manipulation and Hygiene: Modified independent;Sit to/from Nurse, children's Details (indicate cue type and reason): instructed in technique for transfering in/out of  shower, wife to supervise         Vision     Perception     Praxis      Pertinent Vitals/Pain No pain reported     Hand Dominance Right   Extremity/Trunk Assessment Upper Extremity Assessment Upper Extremity Assessment: Overall WFL for tasks assessed   Lower Extremity Assessment Lower Extremity Assessment: Defer to PT evaluation   Cervical / Trunk Assessment Cervical / Trunk Assessment: Normal   Communication Communication Communication: No difficulties   Cognition Arousal/Alertness: Awake/alert Behavior During Therapy: WFL for tasks assessed/performed Overall Cognitive Status: Within Functional Limits for tasks assessed                     General Comments       Exercises      Shoulder Instructions      Home Living Family/patient expects to be discharged to:: Private residence Living Arrangements: Spouse/significant other Available Help at Discharge: Family Type of Home: House Home Access: Stairs to enter Technical brewer of Steps: 5 Entrance Stairs-Rails: Right;Left Home Layout: Two level;1/2 bath on main level;Bed/bath upstairs Alternate Level Stairs-Number of Steps: 13 Alternate Level Stairs-Rails: Right Bathroom Shower/Tub: Occupational psychologist: Standard     Home Equipment: Environmental consultant - 2 wheels;Bedside commode;Adaptive equipment Adaptive Equipment: Reacher Additional Comments: CPM at home      Prior Functioning/Environment Level of Independence: Independent        Comments: works in Biochemist, clinical ed (Counsellor)    OT Diagnosis: Generalized weakness;Acute pain  OT Problem List:     OT Treatment/Interventions:      OT Goals(Current goals can be found in the care plan section) Acute Rehab OT Goals Patient Stated Goal: go home today  OT Frequency:     Barriers to D/C:            Co-evaluation              End of Session Equipment Utilized During Treatment: Rolling walker  Activity Tolerance: Patient  tolerated treatment well Patient left: in chair;with call bell/phone within reach;with nursing/sitter in room;with family/visitor present   Time: [id number] OT Time Calculation (min): 17 min Charges:  OT General Charges $OT Visit: 1 Procedure OT Evaluation $Initial OT Evaluation Tier I: 1 Procedure G-Codes:    Malka So 12/30/2014, 1:39 PM [phone]

## 2014-12-30 NOTE — Care Management Note (Signed)
Case Management Note  Patient Details  Name: Adam BROTHERSON MRN: [id number] Date of Birth: June 07, 1960  Subjective/Objective:         S/p left total knee arthroplasty           Action/Plan: Set up with [facility] for HHPT by MD office. Spoke with patient, no change in discharge plan. Patient states that [employer] delivered CPM, rolling walker and 3N1 to home. Patient states that his wife will be able to assist him after discharge. No other discharge needs identified.   Expected Discharge Date:                  Expected Discharge Plan:  [place]  In-House Referral:  NA  Discharge planning Services  CM Consult  Post Acute Care Choice:  Home Health, Durable Medical Equipment Choice offered to:  Patient  DME Arranged:  3-N-1, CPM, Walker rolling DME Agency:  [employer]  HH Arranged:  PT [place] Agency:  [facility]  Status of Service:  Completed, signed off  Medicare Important Message Given:    Date Medicare IM Given:    Medicare IM give by:    Date Additional Medicare IM Given:    Additional Medicare Important Message give by:     If discussed at [place] of Stay Meetings, dates discussed:    Additional Comments:  Nila Nephew, RN 12/30/2014, 10:34 AM

## 2014-12-30 NOTE — Anesthesia Postprocedure Evaluation (Signed)
  Anesthesia Post-op Note  Patient: Adam Avila  Procedure(s) Performed: Procedure(s): LEFT TOTAL KNEE ARTHROPLASTY (Left)  Patient Location: PACU  Anesthesia Type:Regional and Spinal  Level of Consciousness: awake  Airway and Oxygen Therapy: Patient Spontanous Breathing  Post-op Pain: mild  Post-op Assessment: Post-op Vital signs reviewed, Patient's Cardiovascular Status Stable, Respiratory Function Stable, Patent Airway, No signs of Nausea or vomiting and Pain level controlled LLE Motor Response: Purposeful movement, Responds to commands LLE Sensation: Full sensation RLE Motor Response: Purposeful movement RLE Sensation: Full sensation L Sensory Level: S1-Sole of foot, small toes R Sensory Level: S1-Sole of foot, small toes  Post-op Vital Signs: Reviewed and stable  Last Vitals:  Filed Vitals:   12/30/14 0536  BP: 122/73  Pulse: 66  Temp: 36.6 C  Resp: 18    Complications: No apparent anesthesia complications

## 2014-12-30 NOTE — Discharge Instructions (Signed)
Information on my medicine - ELIQUIS® (apixaban) ° °This medication education was reviewed with me or my healthcare representative as part of my discharge preparation.   ° °Why was Eliquis® prescribed for you? °Eliquis® was prescribed for you to reduce the risk of blood clots forming after orthopedic surgery.   ° °What do You need to know about Eliquis®? °Take your Eliquis® 2.5 mg TWICE DAILY - one tablet in the morning and one tablet in the evening with or without food.  It would be best to take the dose about the same time each day. ° °If you have difficulty swallowing the tablet whole please discuss with your pharmacist how to take the medication safely. ° °Take Eliquis® exactly as prescribed by your doctor and DO NOT stop taking Eliquis® without talking to the doctor who prescribed the medication.  Stopping without other medication to take the place of Eliquis® may increase your risk of developing a clot. ° °After discharge, you should have regular check-up appointments with your healthcare provider that is prescribing your Eliquis®. ° °What do you do if you miss a dose? °If a dose of ELIQUIS® is not taken at the scheduled time, take it as soon as possible on the same day and twice-daily administration should be resumed.  The dose should not be doubled to make up for a missed dose.  Do not take more than one tablet of ELIQUIS at the same time. ° °Important Safety Information °A possible side effect of Eliquis® is bleeding. You should call your healthcare provider right away if you experience any of the following: °? Bleeding from an injury or your nose that does not stop. °? Unusual colored urine (red or dark brown) or unusual colored stools (red or black). °? Unusual bruising for unknown reasons. °? A serious fall or if you hit your head (even if there is no bleeding). ° °Some medicines may interact with Eliquis® and might increase your risk of bleeding or clotting while on Eliquis®. To help avoid this, consult  your healthcare provider or pharmacist prior to using any new prescription or non-prescription medications, including herbals, vitamins, non-steroidal anti-inflammatory drugs (NSAIDs) and supplements. ° °This website has more information on Eliquis® (apixaban): http://www.eliquis.com/eliquis/home ° °

## 2014-12-30 NOTE — Progress Notes (Signed)
Pt ready for d/c per MD. Cleared by PT/OT, equipment has been delivered to house. Discharge instructions and prescriptions were reviewed with pt and wife at bedside, all questions answered. Belongings gathered and removed via wife; pt wheeled out by NT.   Raquel James  12/30/2014

## 2014-12-30 NOTE — Progress Notes (Addendum)
Physical Therapy Treatment Patient Details Name: Adam Avila MRN: [id number] DOB: December 09, 1960 Today's Date: 12/30/2014    History of Present Illness s/p Lt TKR  PMHx- HTN, DDD, HNP    PT Comments    Patient progressing toward PT goals as evident by ambulating 435ft and ascending/descending stairs with supervision. Patient recalls HEP and feels confident with ability to ambulate and use stairs safely. Spouse present for tx and confident in patient's safety and mobility level for d/c home.   Follow Up Recommendations  Home health PT     Equipment Recommendations  None recommended by PT    Recommendations for Other Services       Precautions / Restrictions Precautions Precautions: Knee Knee Immobilizer - Left: Other (comment) (able to perform SLR with <10 degrees lag) Restrictions Weight Bearing Restrictions: Yes LLE Weight Bearing: Weight bearing as tolerated    Mobility  Bed Mobility Overal bed mobility: Independent Bed Mobility: Supine to Sit     Supine to sit: Independent     General bed mobility comments: up in chair upon arrival  Transfers Overall transfer level: Needs assistance Equipment used: Rolling walker (2 wheeled) Transfers: Sit to/from Stand Sit to Stand: Supervision         General transfer comment: supervision for safety; demonstrated carry over of correct technique  Ambulation/Gait Ambulation/Gait assistance: Supervision Ambulation Distance (Feet): 400 Feet Assistive device: Rolling walker (2 wheeled) Gait Pattern/deviations: Step-through pattern;Decreased stride length;Antalgic Gait velocity: decreased   General Gait Details: verbal cues for increased bilateral step length    Stairs Stairs: Yes Stairs assistance: Supervision Stair Management: One rail Right Number of Stairs: 4 General stair comments: Patient recalls technique without cues; supervision for safety   Wheelchair Mobility    Modified Rankin (Stroke Patients Only)        Balance Overall balance assessment: Needs assistance Sitting-balance support: No upper extremity supported Sitting balance-Leahy Scale: Good     Standing balance support: During functional activity Standing balance-Leahy Scale: Fair Standing balance comment: use of RW                    Cognition Arousal/Alertness: Awake/alert Behavior During Therapy: WFL for tasks assessed/performed Overall Cognitive Status: Within Functional Limits for tasks assessed                      Exercises Total Joint Exercises Ankle Circles/Pumps: AROM;Strengthening;Left;10 reps Quad Sets: AROM;Strengthening;Left;10 reps Gluteal Sets: Strengthening;Both;10 reps Short Arc Quad: AROM;Strengthening;Left;10 reps Heel Slides: AAROM;Left;10 reps Straight Leg Raises: AROM;Strengthening;Left;5 reps    General Comments        Pertinent Vitals/Pain Pain Assessment: No/denies pain Pain Score: 5  Pain Location: Lt knee Pain Descriptors / Indicators: Aching Pain Intervention(s): Premedicated before session    Home Living Family/patient expects to be discharged to:: Private residence Living Arrangements: Spouse/significant other Available Help at Discharge: Family Type of Home: House Home Access: Stairs to enter Entrance Stairs-Rails: Right;Left Home Layout: Two level;1/2 bath on main level;Bed/bath upstairs Home Equipment: Walker - 2 wheels;Bedside commode;Adaptive equipment Additional Comments: CPM at home    Prior Function Level of Independence: Independent      Comments: works in Biochemist, clinical ed (Counsellor)   PT Goals (current goals can now be found in the care plan section) Acute Rehab PT Goals Patient Stated Goal: go home today PT Goal Formulation: With patient Time For Goal Achievement: 12/31/14 Potential to Achieve Goals: Good Progress towards PT goals: Progressing toward goals    Frequency  7X/week    PT Plan Current plan remains appropriate     Co-evaluation             End of Session Equipment Utilized During Treatment: Gait belt Activity Tolerance: Patient tolerated treatment well Patient left: in chair;with call bell/phone within reach;with family/visitor present     Time: 1330-1400 PT Time Calculation (min) (ACUTE ONLY): 30 min  Charges:  Gait training 8-22   Therapeutic exercises 8-22                    G CodesEarney Navy, PTA [phone] 12/30/2014 2:18 PM

## 2014-12-30 NOTE — Discharge Summary (Signed)
Patient ID: Adam Avila MRN: [id number] DOB/AGE: [birth year] 54 y.o.  Admit date: 12/29/2014 Discharge date: 12/30/2014  Admission Diagnoses:  Principal Problem:   Primary localized osteoarthritis of left knee Active Problems:   ADJ DISORDER WITH MIXED ANXIETY & DEPRESSED MOOD   HERNIATED LUMBAR DISK WITH RADICULOPATHY   Right-sided low back pain with right-sided sciatica   DJD (degenerative joint disease) of knee   Discharge Diagnoses:  Same  Past Medical History  Diagnosis Date  . Hypertension   . HNP (herniated nucleus pulposus), lumbar     l4-5  . Snoring   . DDD (degenerative disc disease), lumbar   . Primary localized osteoarthritis of left knee 12/29/2014  . Arthritis     "back" (12/29/2014)  . Kidney stones     Surgeries: Procedure(s): LEFT TOTAL KNEE ARTHROPLASTY on 12/29/2014   Consultants:    Discharged Condition: Improved  Hospital Course: Adam Avila is an 54 y.o. male who was admitted 12/29/2014 for operative treatment ofPrimary localized osteoarthritis of left knee. Patient has severe unremitting pain that affects sleep, daily activities, and work/hobbies. After pre-op clearance the patient was taken to the operating room on 12/29/2014 and underwent  Procedure(s): LEFT TOTAL KNEE ARTHROPLASTY.    Patient was given perioperative antibiotics: Anti-infectives    Start     Dose/Rate Route Frequency Ordered Stop   12/29/14 1700  ceFAZolin (ANCEF) 3 g in dextrose 5 % 50 mL IVPB     3 g 160 mL/hr over 30 Minutes Intravenous Every 6 hours 12/29/14 1508 12/29/14 2256   12/29/14 1045  ceFAZolin (ANCEF) 3 g in dextrose 5 % 50 mL IVPB     3 g 160 mL/hr over 30 Minutes Intravenous To ShortStay Surgical 12/28/14 1428 12/29/14 1107       Patient was given sequential compression devices, early ambulation, and chemoprophylaxis to prevent DVT.  Patient benefited maximally from hospital stay and there were no complications.    Recent vital signs: Patient  Vitals for the past 24 hrs:  BP Temp Temp src Pulse Resp SpO2  12/30/14 0536 122/73 mmHg 97.8 F (36.6 C) Oral 66 18 98 %  12/30/14 0012 134/81 mmHg 97.8 F (36.6 C) Oral 70 18 98 %  12/29/14 1947 139/87 mmHg 98.3 F (36.8 C) Oral 84 17 98 %  12/29/14 1500 (!) 124/91 mmHg 97.9 F (36.6 C) - 76 18 94 %  12/29/14 1430 131/82 mmHg 97.8 F (36.6 C) - (!) 57 11 100 %  12/29/14 1415 122/77 mmHg - - 63 18 99 %  12/29/14 1400 110/75 mmHg - - (!) 59 17 100 %  12/29/14 1345 117/77 mmHg - - 70 20 99 %  12/29/14 1330 - - - 77 17 97 %  12/29/14 1325 98/67 mmHg 97.2 F (36.2 C) - - (!) 22 -     Recent laboratory studies:  Recent Labs  12/30/14 0524  WBC 17.6*  HGB 14.0  HCT 39.8  PLT 228  NA 136  K 4.6  CL 101  CO2 25  BUN 16  CREATININE 0.84  GLUCOSE 135*  CALCIUM 9.0     Discharge Medications:     Medication List    STOP taking these medications        aspirin 81 MG tablet     ibuprofen 200 MG tablet  Commonly known as:  ADVIL,MOTRIN      TAKE these medications        apixaban 2.5 MG Tabs tablet  Commonly known  asArne [place]  Take 1 tablet (2.5 mg total) by mouth every 12 (twelve) hours.     celecoxib 200 MG capsule  Commonly known as:  CELEBREX  1 tab po q day with food for pain and  swelling     docusate sodium 100 MG capsule  Commonly known as:  COLACE  1 tab 2 times a day while on narcotics.  STOOL SOFTENER     losartan-hydrochlorothiazide 100-12.5 MG tablet  Commonly known as:  HYZAAR  Take 1 tablet by mouth daily.     oxyCODONE 5 MG immediate release tablet  Commonly known as:  Oxy IR/ROXICODONE  1-2 tablets every 3-4 hrs as needed for breakthrough pain SHORT ACTING PAIN MEDICATION     oxyCODONE 20 mg 12 hr tablet  Commonly known as:  OXYCONTIN  1 pill every 12 hours LONG ACTING PAIN MEDICATION     polyethylene glycol packet  Commonly known as:  MIRALAX / GLYCOLAX  17grams in 16 oz of water twice a day until bowel movement.  LAXITIVE.  Restart  if two days since last bowel movement        Diagnostic Studies: No results found.  Disposition: 01-Home or Self Care      Discharge Instructions    CPM    Complete by:  As directed   Continuous passive motion machine (CPM):      Use the CPM from 0 to 90 for 6 hours per day.       You may break it up into 2 or 3 sessions per day.      Use CPM for 2 weeks or until you are told to stop.     Call MD / Call 911    Complete by:  As directed   If you experience chest pain or shortness of breath, CALL 911 and be transported to the hospital emergency room.  If you develope a fever above 101 F, pus (white drainage) or increased drainage or redness at the wound, or calf pain, call your surgeon's office.     Change dressing    Complete by:  As directed   Change the gauze dressing daily with sterile 4 x 4 inch gauze and apply TED hose.  DO NOT REMOVE BANDAGE OVER SURGICAL INCISION.  [place] WHOLE LEG INCLUDING OVER THE WATERPROOF BANDAGE WITH SOAP AND WATER EVERY DAY.     Constipation Prevention    Complete by:  As directed   Drink plenty of fluids.  Prune juice may be helpful.  You may use a stool softener, such as Colace (over the counter) 100 mg twice a day.  Use MiraLax (over the counter) for constipation as needed.     Diet - low sodium heart healthy    Complete by:  As directed      Discharge instructions    Complete by:  As directed   INSTRUCTIONS AFTER JOINT REPLACEMENT   Remove items at home which could result in a fall. This includes throw rugs or furniture in walking pathways ICE to the affected joint every three hours while awake for 30 minutes at a time, for at least the first 3-5 days, and then as needed for pain and swelling.  Continue to use ice for pain and swelling. You may notice swelling that will progress down to the foot and ankle.  This is normal after surgery.  Elevate your leg when you are not up walking on it.   Continue to use the breathing machine you  got in the  hospital (incentive spirometer) which will help keep your temperature down.  It is common for your temperature to cycle up and down following surgery, especially at night when you are not up moving around and exerting yourself.  The breathing machine keeps your lungs expanded and your temperature down.   DIET:  As you were doing prior to hospitalization, we recommend a well-balanced diet.  DRESSING / WOUND CARE / SHOWERING  Keep the surgical dressing until follow up.  The dressing is water proof, so you can shower without any extra covering.  IF THE DRESSING FALLS OFF or the wound gets wet inside, change the dressing with sterile gauze.  Please use good hand washing techniques before changing the dressing.  Do not use any lotions or creams on the incision until instructed by your surgeon.    ACTIVITY  Increase activity slowly as tolerated, but follow the weight bearing instructions below.   No driving for 6 weeks or until further direction given by your physician.  You cannot drive while taking narcotics.  No lifting or carrying greater than 10 lbs. until further directed by your surgeon. Avoid periods of inactivity such as sitting longer than an hour when not asleep. This helps prevent blood clots.  You may return to work once you are authorized by your doctor.     WEIGHT BEARING   Weight bearing as tolerated with assist device (walker, cane, etc) as directed, use it as long as suggested by your surgeon or therapist, typically at least 1-2 weeks.   EXERCISES  Results after joint replacement surgery are often greatly improved when you follow the exercise, range of motion and muscle strengthening exercises prescribed by your doctor. Safety measures are also important to protect the joint from further injury. Any time any of these exercises cause you to have increased pain or swelling, decrease what you are doing until you are comfortable again and then slowly increase them. If you have  problems or questions, call your caregiver or physical therapist for advice.   Rehabilitation is important following a joint replacement. After just a few days of immobilization, the muscles of the leg can become weakened and shrink (atrophy).  These exercises are designed to build up the tone and strength of the thigh and leg muscles and to improve motion. Often times heat used for twenty to thirty minutes before working out will loosen up your tissues and help with improving the range of motion but do not use heat for the first two weeks following surgery (sometimes heat can increase post-operative swelling).   These exercises can be done on a training (exercise) mat, on the floor, on a table or on a bed. Use whatever works the best and is most comfortable for you.    Use music or television while you are exercising so that the exercises are a pleasant break in your day. This will make your life better with the exercises acting as a break in your routine that you can look forward to.   Perform all exercises about fifteen times, three times per day or as directed.  You should exercise both the operative leg and the other leg as well.   Exercises include:   Quad Sets - Tighten up the muscle on the front of the thigh (Quad) and hold for 5-10 seconds.   Straight Leg Raises - With your knee straight (if you were given a brace, keep it on), lift the leg to 60 degrees, hold for  3 seconds, and slowly lower the leg.  Perform this exercise against resistance later as your leg gets stronger.  Leg Slides: Lying on your back, slowly slide your foot toward your buttocks, bending your knee up off the floor (only go as far as is comfortable). Then slowly slide your foot back down until your leg is flat on the floor again.  Angel Wings: Lying on your back spread your legs to the side as far apart as you can without causing discomfort.  Hamstring Strength:  Lying on your back, push your heel against the floor with your  leg straight by tightening up the muscles of your buttocks.  Repeat, but this time bend your knee to a comfortable angle, and push your heel against the floor.  You may put a pillow under the heel to make it more comfortable if necessary.   A rehabilitation program following joint replacement surgery can speed recovery and prevent re-injury in the future due to weakened muscles. Contact your doctor or a physical therapist for more information on knee rehabilitation.    CONSTIPATION  Constipation is defined medically as fewer than three stools per week and severe constipation as less than one stool per week.  Even if you have a regular bowel pattern at home, your normal regimen is likely to be disrupted due to multiple reasons following surgery.  Combination of anesthesia, postoperative narcotics, change in appetite and fluid intake all can affect your bowels.   YOU MUST use at least one of the following options; they are listed in order of increasing strength to get the job done.  They are all available over the counter, and you may need to use some, POSSIBLY even all of these options:    Drink plenty of fluids (prune juice may be helpful) and high fiber foods Colace 100 mg by mouth twice a day  Senokot for constipation as directed and as needed Dulcolax (bisacodyl), take with full glass of water  Miralax (polyethylene glycol) once or twice a day as needed.  If you have tried all these things and are unable to have a bowel movement in the first 3-4 days after surgery call either your surgeon or your primary doctor.    If you experience loose stools or diarrhea, hold the medications until you stool forms back up.  If your symptoms do not get better within 1 week or if they get worse, check with your doctor.  If you experience "the worst abdominal pain ever" or develop nausea or vomiting, please contact the office immediately for further recommendations for treatment.   ITCHING:  If you experience  itching with your medications, try taking only a single pain pill, or even half a pain pill at a time.  You can also use Benadryl over the counter for itching or also to help with sleep.   TED HOSE STOCKINGS:  Use stockings on both legs until for at least 2 weeks or as directed by physician office. They may be removed at night for sleeping.  MEDICATIONS:  See your medication summary on the "After Visit Summary" that nursing will review with you.  You may have some home medications which will be placed on hold until you complete the course of blood thinner medication.  It is important for you to complete the blood thinner medication as prescribed.  PRECAUTIONS:  If you experience chest pain or shortness of breath - call 911 immediately for transfer to the hospital emergency department.   If you develop  a fever greater that 101 F, purulent drainage from wound, increased redness or drainage from wound, foul odor from the wound/dressing, or calf pain - CONTACT YOUR SURGEON.                                                   FOLLOW-UP APPOINTMENTS:  If you do not already have a post-op appointment, please call the office for an appointment to be seen by your surgeon.  Guidelines for how soon to be seen are listed in your "After Visit Summary", but are typically between 1-4 weeks after surgery.  OTHER INSTRUCTIONS:   Knee Replacement:  Do not place pillow under knee, focus on keeping the knee straight while resting. CPM instructions: 0-90 degrees, 2 hours in the morning, 2 hours in the afternoon, and 2 hours in the evening. Place foam block, curve side up under heel at all times except when in CPM or when walking.  DO NOT modify, tear, cut, or change the foam block in any way.  MAKE SURE YOU:  Understand these instructions.  Get help right away if you are not doing well or get worse.    Thank you for letting us be a part of your medical care team.  It is a privilege we respect greatly.  We hope these  instructions will help you stay on track for a fast and full recovery!     Do not put a pillow under the knee. Place it under the heel.    Complete by:  As directed   Place gray foam block, curve side up under heel at all times except when in CPM or when walking.  DO NOT modify, tear, cut, or change in any way the gray foam block.     Increase activity slowly as tolerated    Complete by:  As directed      TED hose    Complete by:  As directed   Use stockings (TED hose) for 2 weeks on both leg(s).  You may remove them at night for sleeping.           Follow-up Information    Follow up with Lorn Junes, MD On 01/12/2015.   Specialty:  Orthopedic Surgery   Why:  APPT TIME 3 PM   Contact information:   [address] [place] [address] [phone]        Signed: Linda Hedges 12/30/2014, 9:30 AM

## 2015-01-13 ENCOUNTER — Ambulatory Visit (HOSPITAL_BASED_OUTPATIENT_CLINIC_OR_DEPARTMENT_OTHER): Payer: 59 | Attending: Internal Medicine | Admitting: Radiology

## 2015-01-13 VITALS — Ht 70.0 in | Wt 270.0 lb

## 2015-01-13 DIAGNOSIS — G4733 Obstructive sleep apnea (adult) (pediatric): Secondary | ICD-10-CM | POA: Insufficient documentation

## 2015-01-13 DIAGNOSIS — R5383 Other fatigue: Secondary | ICD-10-CM | POA: Insufficient documentation

## 2015-01-13 DIAGNOSIS — R0683 Snoring: Secondary | ICD-10-CM | POA: Insufficient documentation

## 2015-01-13 DIAGNOSIS — G4736 Sleep related hypoventilation in conditions classified elsewhere: Secondary | ICD-10-CM | POA: Diagnosis not present

## 2015-01-13 DIAGNOSIS — G4719 Other hypersomnia: Secondary | ICD-10-CM | POA: Diagnosis not present

## 2015-01-13 DIAGNOSIS — G4737 Central sleep apnea in conditions classified elsewhere: Secondary | ICD-10-CM | POA: Diagnosis not present

## 2015-01-14 ENCOUNTER — Ambulatory Visit: Payer: 59 | Attending: Orthopedic Surgery | Admitting: Physical Therapy

## 2015-01-14 DIAGNOSIS — M24662 Ankylosis, left knee: Secondary | ICD-10-CM | POA: Diagnosis present

## 2015-01-14 DIAGNOSIS — M25462 Effusion, left knee: Secondary | ICD-10-CM | POA: Diagnosis present

## 2015-01-14 DIAGNOSIS — R29898 Other symptoms and signs involving the musculoskeletal system: Secondary | ICD-10-CM | POA: Insufficient documentation

## 2015-01-14 DIAGNOSIS — R269 Unspecified abnormalities of gait and mobility: Secondary | ICD-10-CM | POA: Diagnosis present

## 2015-01-14 DIAGNOSIS — M25562 Pain in left knee: Secondary | ICD-10-CM | POA: Diagnosis present

## 2015-01-14 NOTE — Therapy (Signed)
[place], [place], [address] Phone: [phone]   Fax:  [phone]  Physical Therapy Evaluation  Patient Details  Name: Adam Avila MRN: [id number] Date of Birth: 09/01/1960 Referring Provider: Elsie Saas  Encounter Date: 01/14/2015      PT End of Session - 01/14/15 1538    Visit Number 1   Number of Visits 12   Date for PT Re-Evaluation 02/13/15   PT Start Time 1503   PT Stop Time 1542   PT Time Calculation (min) 39 min   Activity Tolerance Patient tolerated treatment well   Behavior During Therapy [facility] for tasks assessed/performed      Past Medical History  Diagnosis Date  . Hypertension   . HNP (herniated nucleus pulposus), lumbar     l4-5  . Snoring   . DDD (degenerative disc disease), lumbar   . Primary localized osteoarthritis of left knee 12/29/2014  . Arthritis     "back" (12/29/2014)  . Kidney stones     Past Surgical History  Procedure Laterality Date  . Lithotripsy  X 1  . Cystoscopy w/ stone manipulation  ~ 2002  . Colonoscopy    . Lasik Bilateral 2008  . Total knee arthroplasty Left 12/29/2014  . Adenoidectomy  ~ 1970    "not tonsils"  . Total knee arthroplasty Left 12/29/2014    Procedure: LEFT TOTAL KNEE ARTHROPLASTY;  Surgeon: Elsie Saas, MD;  Location: [facility];  Service: Orthopedics;  Laterality: Left;    There were no vitals filed for this visit.  Visit Diagnosis:  Decreased range of motion of knee, left - Plan: PT plan of care cert/re-cert  Decreased strength involving knee joint - Plan: PT plan of care cert/re-cert  Knee swelling, left - Plan: PT plan of care cert/re-cert  Left knee pain - Plan: PT plan of care cert/re-cert  Gait abnormality - Plan: PT plan of care cert/re-cert      Subjective Assessment - 01/14/15 1508    Subjective Feeling like things are going well, weaning off pain meds. Did get home health and has been making progress.    Limitations Standing;Walking;House hold activities   How long can you sit comfortably? 1 hour   How long can you stand comfortably? 15 minutes   How long can you walk comfortably? 15 minutes   Patient Stated Goals Get up and moving around more.   Currently in Pain? Yes   Pain Score 2    Pain Location Knee   Pain Orientation Left   Pain Descriptors / Indicators Aching;Sore   Pain Type Surgical pain   Pain Onset 1 to 4 weeks ago   Pain Frequency Intermittent   Aggravating Factors  Up walking, sitting too long.   Pain Relieving Factors Movement, easy activity.   Effect of Pain on Daily Activities Not able to do as much.            [facility] PT Assessment - 01/14/15 0001    Assessment   Medical Diagnosis DJD s/p Lt TKA   Referring Provider Elsie Saas   Onset Date/Surgical Date 12/29/14   Next MD Visit 02/11/15   Prior Therapy Home health 2 visits   Restrictions   Weight Bearing Restrictions No   Balance Screen   Has the patient fallen in the past 6 months No   [place] residence   Prior Function   Level of Independence Independent   Cognition  Overall Cognitive Status Within Functional Limits for tasks assessed   Observation/Other Assessments   Observations no drainage or erythema   Observation/Other Assessments-Edema    Edema --  non pitting edema through LLE   Sensation   Light Touch Appears Intact   PROM   Left Knee Extension 3   Left Knee Flexion 103   supine AAROM   Strength   Overall Strength Comments able to perform Lt SLR without lag.    Palpation   Palpation comment significant effusion through LLE    Ambulation/Gait   Gait Comments using SPC with ambulation                   [facility] Adult PT Treatment/Exercise - 01/14/15 0001    Knee/Hip Exercises: Seated   Knee/Hip Flexion seated knee flexion stretch, 109 degrees   Knee/Hip Exercises: Supine   Quad Sets Strengthening;Left;1 set;10 reps   Heel Slides  AAROM;Left;1 set;10 reps   Straight Leg Raises Strengthening;Left;1 set;10 reps                PT Education - 01/14/15 1537    Education provided Yes   Education Details edema control, ice, activity progression   Person(s) Educated Patient   Methods Explanation   Comprehension Verbalized understanding;Need further instruction          PT Short Term Goals - 01/14/15 1548    PT SHORT TERM GOAL #1   Title Patient to demo 0 degrees on Lt knee extension for gait stabiltiy.   Time 3   Period Weeks   Status New   PT SHORT TERM GOAL #2   Title Patient to ambulate without an assistive device.   Time 3   Period Weeks   Status New   PT SHORT TERM GOAL #3   Title Patient to be independent with progressive HEP   Time 2   Period Weeks   Status New           PT Long Term Goals - 01/14/15 1550    PT LONG TERM GOAL #1   Title Patient to demon 115 degrees of Lt knee flexion or greater for getting in/out of low vehicles.    Time 6   Period Weeks   Status New   PT LONG TERM GOAL #2   Title Patient to reports ability to ambulate 30 minutes without an assistive device for shopping tasks.    Time 6   Period Weeks   Status New   PT LONG TERM GOAL #3   Title Patient to ambulate up/down 5 stairs step over step.    Time 6   Period Weeks   Status New   PT LONG TERM GOAL #4   Title Patient to demonstrate 4+/5 strength through Lt knee extension for lifting tasks and activity safety   Time 6   Period Weeks   Status New               Plan - 01/14/15 1753    Pt will benefit from skilled therapeutic intervention in order to improve on the following deficits Abnormal gait;Decreased endurance;Increased edema;Decreased activity tolerance;Decreased strength;Pain;Difficulty walking;Decreased mobility;Decreased balance;Decreased range of motion   Rehab Potential Good   PT Frequency 2x / week   PT Duration 6 weeks   PT Treatment/Interventions ADLs/Self Care Home  Management;Electrical Stimulation;Moist Heat;Ultrasound;Gait training;Stair training;Functional mobility training;Therapeutic activities;Therapeutic exercise;Balance training;Manual techniques;Patient/family education;Passive range of motion   PT Next Visit Plan Begin progression of strengthening and ROM activities. Trial  game ready for edema control.    PT Home Exercise Plan Seated knee flexion, squats, single leg stance as appropriate.    Consulted and Agree with Plan of Care Patient         Problem List Patient Active Problem List   Diagnosis Date Noted  . Primary localized osteoarthritis of left knee 12/29/2014  . DJD (degenerative joint disease) of knee 12/29/2014  . Tear of lateral meniscus of left knee 12/05/2013  . Primary osteoarthritis of left knee 11/07/2013  . Right-sided low back pain with right-sided sciatica 11/07/2013  . MUSCLE STRAIN, LEFT CALF 10/07/2009  . HERNIATED LUMBAR DISK WITH RADICULOPATHY 08/26/2008  . ADJ DISORDER WITH MIXED ANXIETY & DEPRESSED MOOD 07/30/2008    Linard Millers, PT 01/14/2015, 6:01 PM  [facility] [address] [place], [place], [address] Phone: [phone]   Fax:  [phone]  Name: Adam Avila MRN: [id number] Date of Birth: [birth year]

## 2015-01-17 DIAGNOSIS — R0683 Snoring: Secondary | ICD-10-CM | POA: Diagnosis not present

## 2015-01-17 DIAGNOSIS — G4733 Obstructive sleep apnea (adult) (pediatric): Secondary | ICD-10-CM | POA: Diagnosis not present

## 2015-01-17 DIAGNOSIS — R5383 Other fatigue: Secondary | ICD-10-CM | POA: Diagnosis not present

## 2015-01-17 NOTE — Progress Notes (Signed)
   Patient Name: Adam Avila, Adam Avila Date: 01/13/2015 Gender: Male D.O.B: 1960-10-02 Age (years): 54 Referring Provider: Carlena Sax Height (inches): 41 Interpreting Physician: Baird Lyons MD, ABSM Weight (lbs): 270 RPSGT: Jacolyn Reedy BMI: 38 MRN: [id number] Neck Size: 17.50 CLINICAL INFORMATION Sleep Study Type:Unattended home sleep test   Indication for sleep study: Excessive Daytime Sleepiness (780.79), Fatigue, OSA, Snoring   Epworth Sleepiness Score: 9 SLEEP STUDY TECHNIQUE A multi-channel overnight portable sleep study was performed. The channels recorded were: nasal airflow, thoracic respiratory movement, and oxygen saturation with a pulse oximetry. Snoring was also monitored. MEDICATIONS Patient self administered medications include: N/A.  SLEEP ARCHITECTURE Patient was studied for 387.7 minutes. The sleep efficiency was 99.7 % and the patient was supine for 56.8%. The arousal index was 0.0 per hour.  RESPIRATORY PARAMETERS The overall AHI was 30.8 per hour, with a central apnea index of 7.3 per hour. The oxygen nadir was 64% during sleep. Mean saturation 91%   CARDIAC DATA Mean heart rate during sleep was 56.3 bpm.  IMPRESSIONS - Severe obstructive sleep apnea occurred during this study (AHI = 30.8/h). - Mild central sleep apnea occurred during this study (CAI = 7.3/h). - Severe oxygen desaturation was noted during this study (Min O2 = 64%). - Patient snored 25.9% during the sleep.  DIAGNOSIS - Obstructive Sleep Apnea (327.23 [G47.33 ICD-10]) - Central Sleep Apnea (327.27 [G47.37 ICD-10]) - Nocturnal Hypoxemia (327.26 [G47.36 ICD-10])  RECOMMENDATIONS - CPAP titration to determine optimal pressure required to alleviate sleep disordered breathing. BiPAP or ASV titration may be required to eliminate central sleep apnea. - Avoid alcohol, sedatives and other CNS depressants that may worsen sleep apnea and disrupt normal sleep architecture. - Sleep  hygiene should be reviewed to assess factors that may improve sleep quality. - Weight management and regular exercise should be initiated or continued.  Deneise Lever Diplomate, American Board of Sleep Medicine  ELECTRONICALLY SIGNED ON:  01/17/2015, 10:01 AM [place] PH: [phone]   FX: [phone] [place]

## 2015-01-20 ENCOUNTER — Ambulatory Visit: Payer: 59 | Attending: Orthopedic Surgery | Admitting: Physical Therapy

## 2015-01-20 DIAGNOSIS — M25562 Pain in left knee: Secondary | ICD-10-CM | POA: Diagnosis present

## 2015-01-20 DIAGNOSIS — M24662 Ankylosis, left knee: Secondary | ICD-10-CM | POA: Insufficient documentation

## 2015-01-20 DIAGNOSIS — R269 Unspecified abnormalities of gait and mobility: Secondary | ICD-10-CM | POA: Insufficient documentation

## 2015-01-20 DIAGNOSIS — M25462 Effusion, left knee: Secondary | ICD-10-CM | POA: Insufficient documentation

## 2015-01-20 DIAGNOSIS — R29898 Other symptoms and signs involving the musculoskeletal system: Secondary | ICD-10-CM | POA: Insufficient documentation

## 2015-01-20 NOTE — Therapy (Signed)
[place], [place], [address] Phone: [phone]   Fax:  [phone]  Physical Therapy Treatment  Patient Details  Name: Adam Avila MRN: [id number] Date of Birth: 07-05-1960 Referring Provider: Elsie Saas  Encounter Date: 01/20/2015      PT End of Session - 01/20/15 1440    Visit Number 2   Number of Visits 12   Date for PT Re-Evaluation 02/13/15   PT Start Time [id number]   PT Stop Time 1415   PT Time Calculation (min) 38 min   Activity Tolerance Patient tolerated treatment well   Behavior During Therapy [facility] for tasks assessed/performed      Past Medical History  Diagnosis Date  . Hypertension   . HNP (herniated nucleus pulposus), lumbar     l4-5  . Snoring   . DDD (degenerative disc disease), lumbar   . Primary localized osteoarthritis of left knee 12/29/2014  . Arthritis     "back" (12/29/2014)  . Kidney stones     Past Surgical History  Procedure Laterality Date  . Lithotripsy  X 1  . Cystoscopy w/ stone manipulation  ~ 2002  . Colonoscopy    . Lasik Bilateral 2008  . Total knee arthroplasty Left 12/29/2014  . Adenoidectomy  ~ 1970    "not tonsils"  . Total knee arthroplasty Left 12/29/2014    Procedure: LEFT TOTAL KNEE ARTHROPLASTY;  Surgeon: Elsie Saas, MD;  Location: [place];  Service: Orthopedics;  Laterality: Left;    There were no vitals filed for this visit.  Visit Diagnosis:  Decreased range of motion of knee, left  Decreased strength involving knee joint  Knee swelling, left  Gait abnormality  Left knee pain      Subjective Assessment - 01/20/15 1426    Subjective Not feeling as stiff overall. The skin around the knee still feeling tight. Been working on exercises at home.                          [facility] Adult PT Treatment/Exercise - 01/20/15 0001    Ambulation/Gait   Gait Comments using SPC with ambulation   Knee/Hip Exercises: Stretches    Passive Hamstring Stretch Left;3 reps;30 seconds   Knee/Hip Exercises: Aerobic   Nustep L5 X 6 minutes   Knee/Hip Exercises: Standing   Functional Squat 2 sets;10 reps;Other (comment)  Rt foot forward- cues for wt shift rt   Other Standing Knee Exercises retro amb with butt    Knee/Hip Exercises: Seated   Long Arc Quad Strengthening;Left;2 sets;10 reps;Other (comment)  mod +resist   Heel Slides AAROM;Left;2 sets;10 reps;Other (comment)  seated knee flexion with overpressure   Hamstring Curl Strengthening;Left;2 sets;10 reps;Other (comment)  grade 4 band   Knee/Hip Exercises: Supine   Heel Slides AAROM;Left;1 set;10 reps;Other (comment)  117 degrees    Manual Therapy   Manual therapy comments scar mobilization/patellar mobs during exercise rest breaks.                 PT Education - 01/20/15 1439    Education provided Yes   Education Details discussed compensations and need to focus on LLE with daily tasks like sit/stand.    Person(s) Educated Patient   Methods Explanation;Demonstration;Tactile cues;Verbal cues   Comprehension Verbalized understanding;Returned demonstration;Verbal cues required          PT Short Term Goals - 01/14/15 1548    PT SHORT TERM GOAL #1  Title Patient to demo 0 degrees on Lt knee extension for gait stabiltiy.   Time 3   Period Weeks   Status New   PT SHORT TERM GOAL #2   Title Patient to ambulate without an assistive device.   Time 3   Period Weeks   Status New   PT SHORT TERM GOAL #3   Title Patient to be independent with progressive HEP   Time 2   Period Weeks   Status New           PT Long Term Goals - 01/14/15 1550    PT LONG TERM GOAL #1   Title Patient to demon 115 degrees of Lt knee flexion or greater for getting in/out of low vehicles.    Time 6   Period Weeks   Status New   PT LONG TERM GOAL #2   Title Patient to reports ability to ambulate 30 minutes without an assistive device for shopping tasks.    Time 6    Period Weeks   Status New   PT LONG TERM GOAL #3   Title Patient to ambulate up/down 5 stairs step over step.    Time 6   Period Weeks   Status New   PT LONG TERM GOAL #4   Title Patient to demonstrate 4+/5 strength through Lt knee extension for lifting tasks and activity safety   Time 6   Period Weeks   Status New               Plan - 01/20/15 1441    Clinical Impression Statement Patient tolerating session well and denies any increased pain following session. Knee flexion with heelslide 117 degrees. Observed decreased range with hip in extension. Able to progress activity without increased swelling or complaints,  continue with sessions as tolerated with focus on Lt knee ROM, strenght and functional mobility.     PT Next Visit Plan continue with strengthening and ROM, check knee extension range, attempt prone knee flexion stretch, stepups as able, possible foam roller to Hamstring/calf musculature.    PT Home Exercise Plan consider stepups if good technique during session, possible prone quad stretch.    Consulted and Agree with Plan of Care Patient        Problem List Patient Active Problem List   Diagnosis Date Noted  . Primary localized osteoarthritis of left knee 12/29/2014  . DJD (degenerative joint disease) of knee 12/29/2014  . Tear of lateral meniscus of left knee 12/05/2013  . Primary osteoarthritis of left knee 11/07/2013  . Right-sided low back pain with right-sided sciatica 11/07/2013  . MUSCLE STRAIN, LEFT CALF 10/07/2009  . HERNIATED LUMBAR DISK WITH RADICULOPATHY 08/26/2008  . ADJ DISORDER WITH MIXED ANXIETY & DEPRESSED MOOD 07/30/2008    Linard Millers, PT 01/20/2015, 2:48 PM  [facility] [address] [place], [place], [address] Phone: [phone]   Fax:  [phone]  Name: Adam Avila MRN: [id number] Date of Birth: 11-08-1960

## 2015-01-22 ENCOUNTER — Ambulatory Visit: Payer: 59 | Admitting: Physical Therapy

## 2015-01-22 DIAGNOSIS — R269 Unspecified abnormalities of gait and mobility: Secondary | ICD-10-CM

## 2015-01-22 DIAGNOSIS — M25462 Effusion, left knee: Secondary | ICD-10-CM

## 2015-01-22 DIAGNOSIS — M24662 Ankylosis, left knee: Secondary | ICD-10-CM

## 2015-01-22 DIAGNOSIS — R29898 Other symptoms and signs involving the musculoskeletal system: Secondary | ICD-10-CM

## 2015-01-22 DIAGNOSIS — M25562 Pain in left knee: Secondary | ICD-10-CM

## 2015-01-22 NOTE — Therapy (Signed)
[place], [place], [address] Phone: [phone]   Fax:  [phone]  Physical Therapy Treatment  Patient Details  Name: Adam Avila MRN: [id number] Date of Birth: [birth year] Referring Provider: Elsie Saas  Encounter Date: 01/22/2015      PT End of Session - 01/22/15 1415    Visit Number 3   Number of Visits 12   Date for PT Re-Evaluation 02/13/15   PT Start Time [id number]   PT Stop Time 1413   PT Time Calculation (min) 46 min   Activity Tolerance Patient tolerated treatment well   Behavior During Therapy [facility] for tasks assessed/performed      Past Medical History  Diagnosis Date  . Hypertension   . HNP (herniated nucleus pulposus), lumbar     l4-5  . Snoring   . DDD (degenerative disc disease), lumbar   . Primary localized osteoarthritis of left knee 12/29/2014  . Arthritis     "back" (12/29/2014)  . Kidney stones     Past Surgical History  Procedure Laterality Date  . Lithotripsy  X 1  . Cystoscopy w/ stone manipulation  ~ 2002  . Colonoscopy    . Lasik Bilateral 2008  . Total knee arthroplasty Left 12/29/2014  . Adenoidectomy  ~ 1970    "not tonsils"  . Total knee arthroplasty Left 12/29/2014    Procedure: LEFT TOTAL KNEE ARTHROPLASTY;  Surgeon: Elsie Saas, MD;  Location: [place];  Service: Orthopedics;  Laterality: Left;    There were no vitals filed for this visit.  Visit Diagnosis:  Decreased strength involving knee joint  Decreased range of motion of knee, left  Knee swelling, left  Gait abnormality  Left knee pain      Subjective Assessment - 01/22/15 1331    Subjective Doing pretty good, a little stiff still today but "that's to be expected".   Currently in Pain? Yes   Pain Score 1    Pain Location Knee   Pain Orientation Left   Pain Descriptors / Indicators Sore   Pain Type Surgical pain                         [facility] Adult PT  Treatment/Exercise - 01/22/15 0001    Knee/Hip Exercises: Stretches   Passive Hamstring Stretch Left;3 reps;30 seconds   Knee/Hip Exercises: Aerobic   Nustep L5 X 6 minutes   Knee/Hip Exercises: Standing   Lateral Step Up 2 sets;10 reps;Step Height: 4"   Lateral Step Up Limitations cues for technique   Functional Squat 2 sets;10 reps;Other (comment)  Rt foot on 4 inch block   Other Standing Knee Exercises high knees/butt kicks 4 X 20 feet   Knee/Hip Exercises: Seated   Long Arc Quad Strengthening;Left;2 sets;10 reps;Other (comment)  mod +resist   Hamstring Curl Strengthening;Left;2 sets;10 reps;Other (comment)  grade 4 band   Knee/Hip Exercises: Prone   Hamstring Curl 1 set;10 reps   Hamstring Curl Limitations knee flexion stretch   Manual Therapy   Manual therapy comments scar mobilization/patellar mobs during exercise rest breaks.                 PT Education - 01/22/15 1414    Education provided Yes   Education Details activty progression and edema management   Person(s) Educated Patient   Methods Explanation;Tactile cues;Verbal cues   Comprehension Verbalized understanding;Returned demonstration          PT Short Term  Goals - 01/14/15 1548    PT SHORT TERM GOAL #1   Title Patient to demo 0 degrees on Lt knee extension for gait stabiltiy.   Time 3   Period Weeks   Status New   PT SHORT TERM GOAL #2   Title Patient to ambulate without an assistive device.   Time 3   Period Weeks   Status New   PT SHORT TERM GOAL #3   Title Patient to be independent with progressive HEP   Time 2   Period Weeks   Status New           PT Long Term Goals - 01/14/15 1550    PT LONG TERM GOAL #1   Title Patient to demon 115 degrees of Lt knee flexion or greater for getting in/out of low vehicles.    Time 6   Period Weeks   Status New   PT LONG TERM GOAL #2   Title Patient to reports ability to ambulate 30 minutes without an assistive device for shopping tasks.     Time 6   Period Weeks   Status New   PT LONG TERM GOAL #3   Title Patient to ambulate up/down 5 stairs step over step.    Time 6   Period Weeks   Status New   PT LONG TERM GOAL #4   Title Patient to demonstrate 4+/5 strength through Lt knee extension for lifting tasks and activity safety   Time 6   Period Weeks   Status New               Plan - 01/22/15 1416    Clinical Impression Statement Patient is making good progress with PT intervention. Session focused on strength and Lt knee ROM. Knee flexion able to get to 120 degrees in sitting with manual overpressure. Strength deficits continue and will continue to be addressed during following sessions.    PT Next Visit Plan prone knee flexion, stepup and squats with emphasis on technique and avoiding compensations   PT Home Exercise Plan add squats and stepups to HEP if appropriate   Consulted and Agree with Plan of Care Patient        Problem List Patient Active Problem List   Diagnosis Date Noted  . Primary localized osteoarthritis of left knee 12/29/2014  . DJD (degenerative joint disease) of knee 12/29/2014  . Tear of lateral meniscus of left knee 12/05/2013  . Primary osteoarthritis of left knee 11/07/2013  . Right-sided low back pain with right-sided sciatica 11/07/2013  . MUSCLE STRAIN, LEFT CALF 10/07/2009  . HERNIATED LUMBAR DISK WITH RADICULOPATHY 08/26/2008  . ADJ DISORDER WITH MIXED ANXIETY & DEPRESSED MOOD 07/30/2008    Linard Millers, PT 01/22/2015, 4:10 PM  [facility] [address] [place], [place], [address] Phone: [phone]   Fax:  [phone]  Name: ZI WATERMAN MRN: [id number] Date of Birth: 09/26/1960

## 2015-01-27 ENCOUNTER — Ambulatory Visit: Payer: 59 | Admitting: Physical Therapy

## 2015-01-27 DIAGNOSIS — M25562 Pain in left knee: Secondary | ICD-10-CM

## 2015-01-27 DIAGNOSIS — M24662 Ankylosis, left knee: Secondary | ICD-10-CM

## 2015-01-27 DIAGNOSIS — R29898 Other symptoms and signs involving the musculoskeletal system: Secondary | ICD-10-CM

## 2015-01-27 DIAGNOSIS — R269 Unspecified abnormalities of gait and mobility: Secondary | ICD-10-CM

## 2015-01-27 DIAGNOSIS — M25462 Effusion, left knee: Secondary | ICD-10-CM

## 2015-01-27 NOTE — Therapy (Signed)
[place], [place], [address] Phone: [phone]   Fax:  [phone]  Physical Therapy Treatment  Patient Details  Name: Adam Avila MRN: [id number] Date of Birth: [birth year] Referring Provider: Elsie Saas  Encounter Date: 01/27/2015      PT End of Session - 01/27/15 1505    Visit Number 4   Number of Visits 12   Date for PT Re-Evaluation 02/13/15   PT Start Time [id number]   PT Stop Time 1507   PT Time Calculation (min) 45 min   Activity Tolerance Patient tolerated treatment well;No increased pain   Behavior During Therapy [facility] for tasks assessed/performed      Past Medical History  Diagnosis Date  . Hypertension   . HNP (herniated nucleus pulposus), lumbar     l4-5  . Snoring   . DDD (degenerative disc disease), lumbar   . Primary localized osteoarthritis of left knee 12/29/2014  . Arthritis     "back" (12/29/2014)  . Kidney stones     Past Surgical History  Procedure Laterality Date  . Lithotripsy  X 1  . Cystoscopy w/ stone manipulation  ~ 2002  . Colonoscopy    . Lasik Bilateral 2008  . Total knee arthroplasty Left 12/29/2014  . Adenoidectomy  ~ 1970    "not tonsils"  . Total knee arthroplasty Left 12/29/2014    Procedure: LEFT TOTAL KNEE ARTHROPLASTY;  Surgeon: Elsie Saas, MD;  Location: [place];  Service: Orthopedics;  Laterality: Left;    There were no vitals filed for this visit.  Visit Diagnosis:  Decreased strength involving knee joint  Decreased range of motion of knee, left  Knee swelling, left  Gait abnormality  Left knee pain      Subjective Assessment - 01/27/15 1427    Subjective Getting better, spent too much time on the feet over the weekend. Got a little stiff after being up too much. Thinking might be albe to go back to work around the [employer].    Currently in Pain? Yes   Pain Score 1    Pain Location Knee   Pain Orientation Left   Pain Descriptors  / Indicators Tightness   Aggravating Factors  standing or being on the feet too long.   Pain Relieving Factors rest, ice, meds                         [facility] Adult PT Treatment/Exercise - 01/27/15 0001    Ambulation/Gait   Gait Comments Ambulating without device   Knee/Hip Exercises: Stretches   Active Hamstring Stretch Left;3 reps;30 seconds   Passive Hamstring Stretch 2 reps;30 seconds   Other Knee/Hip Stretches prone knee flexion, 3X30 seconds   Other Knee/Hip Stretches knee extension stretch with overpressure 3X60 seconds   Knee/Hip Exercises: Aerobic   Recumbent Bike L1 X 49min (ROM focus)   Nustep L5 X 7 minutes   Knee/Hip Exercises: Seated   Long Arc Quad Strengthening;2 sets;10 reps   Long Arc Quad Weight 8 lbs.   Heel Slides AROM;Left;1 set;5 reps;Other (comment)  knee flexion 120 degrees   Hamstring Curl Strengthening;Left;2 sets;10 reps;Other (comment)  blue band                PT Education - 01/27/15 1504    Education provided Yes   Education Details knee extension focus at home   Person(s) Educated Patient   Methods Explanation;Demonstration   Comprehension Verbalized understanding  PT Short Term Goals - 01/14/15 1548    PT SHORT TERM GOAL #1   Title Patient to demo 0 degrees on Lt knee extension for gait stabiltiy.   Time 3   Period Weeks   Status New   PT SHORT TERM GOAL #2   Title Patient to ambulate without an assistive device.   Time 3   Period Weeks   Status New   PT SHORT TERM GOAL #3   Title Patient to be independent with progressive HEP   Time 2   Period Weeks   Status New           PT Long Term Goals - 01/14/15 1550    PT LONG TERM GOAL #1   Title Patient to demon 115 degrees of Lt knee flexion or greater for getting in/out of low vehicles.    Time 6   Period Weeks   Status New   PT LONG TERM GOAL #2   Title Patient to reports ability to ambulate 30 minutes without an assistive device for shopping  tasks.    Time 6   Period Weeks   Status New   PT LONG TERM GOAL #3   Title Patient to ambulate up/down 5 stairs step over step.    Time 6   Period Weeks   Status New   PT LONG TERM GOAL #4   Title Patient to demonstrate 4+/5 strength through Lt knee extension for lifting tasks and activity safety   Time 6   Period Weeks   Status New               Plan - 01/27/15 1506    PT Next Visit Plan closed chain stepups, squats. Knee extension stretches    PT Home Exercise Plan squats, stepups   Consulted and Agree with Plan of Care Patient        Problem List Patient Active Problem List   Diagnosis Date Noted  . Primary localized osteoarthritis of left knee 12/29/2014  . DJD (degenerative joint disease) of knee 12/29/2014  . Tear of lateral meniscus of left knee 12/05/2013  . Primary osteoarthritis of left knee 11/07/2013  . Right-sided low back pain with right-sided sciatica 11/07/2013  . MUSCLE STRAIN, LEFT CALF 10/07/2009  . HERNIATED LUMBAR DISK WITH RADICULOPATHY 08/26/2008  . ADJ DISORDER WITH MIXED ANXIETY & DEPRESSED MOOD 07/30/2008    Linard Millers, PT 01/27/2015, 3:12 PM  [facility] [address] [place], [place], [address] Phone: [phone]   Fax:  [phone]  Name: KEION CRINER MRN: [id number] Date of Birth: Apr 12, 1960

## 2015-01-29 ENCOUNTER — Ambulatory Visit: Payer: 59 | Admitting: Physical Therapy

## 2015-01-29 DIAGNOSIS — M24662 Ankylosis, left knee: Secondary | ICD-10-CM

## 2015-01-29 DIAGNOSIS — M25562 Pain in left knee: Secondary | ICD-10-CM

## 2015-01-29 DIAGNOSIS — R29898 Other symptoms and signs involving the musculoskeletal system: Secondary | ICD-10-CM

## 2015-01-29 DIAGNOSIS — R269 Unspecified abnormalities of gait and mobility: Secondary | ICD-10-CM

## 2015-01-29 DIAGNOSIS — M25462 Effusion, left knee: Secondary | ICD-10-CM

## 2015-01-29 NOTE — Therapy (Signed)
[place], [place], [address] Phone: [phone]   Fax:  [phone]  Physical Therapy Treatment  Patient Details  Name: Adam Avila MRN: [id number] Date of Birth: 07-03-1960 Referring Provider: Elsie Saas  Encounter Date: 01/29/2015      PT End of Session - 01/29/15 1457    Visit Number 5   Number of Visits 12   Date for PT Re-Evaluation 02/13/15   PT Start Time 1419   PT Stop Time 1500   PT Time Calculation (min) 41 min   Activity Tolerance Patient tolerated treatment well   Behavior During Therapy [facility] for tasks assessed/performed      Past Medical History  Diagnosis Date  . Hypertension   . HNP (herniated nucleus pulposus), lumbar     l4-5  . Snoring   . DDD (degenerative disc disease), lumbar   . Primary localized osteoarthritis of left knee 12/29/2014  . Arthritis     "back" (12/29/2014)  . Kidney stones     Past Surgical History  Procedure Laterality Date  . Lithotripsy  X 1  . Cystoscopy w/ stone manipulation  ~ 2002  . Colonoscopy    . Lasik Bilateral 2008  . Total knee arthroplasty Left 12/29/2014  . Adenoidectomy  ~ 1970    "not tonsils"  . Total knee arthroplasty Left 12/29/2014    Procedure: LEFT TOTAL KNEE ARTHROPLASTY;  Surgeon: Elsie Saas, MD;  Location: [place];  Service: Orthopedics;  Laterality: Left;    There were no vitals filed for this visit.  Visit Diagnosis:  Decreased strength involving knee joint  Decreased range of motion of knee, left  Knee swelling, left  Gait abnormality  Left knee pain      Subjective Assessment - 01/29/15 1432    Subjective Really trying to work on getting the knee straight again, using bone foam at home now. Surprised at how hard it was to get the knee straight at the last session.    Currently in Pain? Yes   Pain Score 1    Pain Location Knee   Pain Orientation Left   Pain Descriptors / Indicators Aching                          [facility] Adult PT Treatment/Exercise - 01/29/15 0001    Knee/Hip Exercises: Stretches   Passive Hamstring Stretch 3 reps;30 seconds  strap   Other Knee/Hip Stretches prone knee flexion, 3X30 seconds   Other Knee/Hip Stretches knee extension stretch with overpressure 3X60 seconds   Knee/Hip Exercises: Aerobic   Recumbent Bike L3 X 50min (ROM focus)   Nustep L6 X 8 min   Knee/Hip Exercises: Standing   Lateral Step Up 2 sets;10 reps;Step Height: 4"   Lateral Step Up Limitations cues for technique                PT Education - 01/29/15 1456    Education provided Yes   Education Details knee extension stretches   Person(s) Educated Patient   Methods Explanation   Comprehension Verbalized understanding          PT Short Term Goals - 01/14/15 1548    PT SHORT TERM GOAL #1   Title Patient to demo 0 degrees on Lt knee extension for gait stabiltiy.   Time 3   Period Weeks   Status New   PT SHORT TERM GOAL #2   Title Patient to ambulate without  an assistive device.   Time 3   Period Weeks   Status New   PT SHORT TERM GOAL #3   Title Patient to be independent with progressive HEP   Time 2   Period Weeks   Status New           PT Long Term Goals - 01/14/15 1550    PT LONG TERM GOAL #1   Title Patient to demon 115 degrees of Lt knee flexion or greater for getting in/out of low vehicles.    Time 6   Period Weeks   Status New   PT LONG TERM GOAL #2   Title Patient to reports ability to ambulate 30 minutes without an assistive device for shopping tasks.    Time 6   Period Weeks   Status New   PT LONG TERM GOAL #3   Title Patient to ambulate up/down 5 stairs step over step.    Time 6   Period Weeks   Status New   PT LONG TERM GOAL #4   Title Patient to demonstrate 4+/5 strength through Lt knee extension for lifting tasks and activity safety   Time 6   Period Weeks   Status New               Plan - 01/29/15 1457     Clinical Impression Statement Making progress with ROM and strenght. Working on knee extension primarily for ROM. Cues to avoid compensations with strengthening exercises. Will continue to progress as tolerated.    PT Next Visit Plan closed chain stepups, squats. Knee extension stretches    PT Home Exercise Plan squats, stepups   Consulted and Agree with Plan of Care Patient        Problem List Patient Active Problem List   Diagnosis Date Noted  . Primary localized osteoarthritis of left knee 12/29/2014  . DJD (degenerative joint disease) of knee 12/29/2014  . Tear of lateral meniscus of left knee 12/05/2013  . Primary osteoarthritis of left knee 11/07/2013  . Right-sided low back pain with right-sided sciatica 11/07/2013  . MUSCLE STRAIN, LEFT CALF 10/07/2009  . HERNIATED LUMBAR DISK WITH RADICULOPATHY 08/26/2008  . ADJ DISORDER WITH MIXED ANXIETY & DEPRESSED MOOD 07/30/2008    Linard Millers, PT 01/29/2015, 3:02 PM  [facility] [address] [place], [place], [address] Phone: [phone]   Fax:  [phone]  Name: Adam Avila MRN: [id number] Date of Birth: Jan 16, 1961

## 2015-02-03 ENCOUNTER — Ambulatory Visit: Payer: 59 | Admitting: Physical Therapy

## 2015-02-03 DIAGNOSIS — M25462 Effusion, left knee: Secondary | ICD-10-CM

## 2015-02-03 DIAGNOSIS — R29898 Other symptoms and signs involving the musculoskeletal system: Secondary | ICD-10-CM

## 2015-02-03 DIAGNOSIS — M24662 Ankylosis, left knee: Secondary | ICD-10-CM

## 2015-02-03 DIAGNOSIS — M25562 Pain in left knee: Secondary | ICD-10-CM

## 2015-02-03 DIAGNOSIS — R269 Unspecified abnormalities of gait and mobility: Secondary | ICD-10-CM

## 2015-02-03 NOTE — Therapy (Signed)
Adam Avila, [place], [address] Phone: [phone]   Fax:  [phone]  Physical Therapy Treatment  Patient Details  Name: Adam Avila MRN: [id number] Date of Birth: Dec 30, 1960 Referring Provider: Elsie Saas  Encounter Date: 02/03/2015      PT End of Session - 02/03/15 1457    Visit Number 6   Number of Visits 12   Date for PT Re-Evaluation 02/13/15   PT Start Time 1421   PT Stop Time 1503   PT Time Calculation (min) 42 min   Activity Tolerance Patient tolerated treatment well   Behavior During Therapy [facility] for tasks assessed/performed      Past Medical History  Diagnosis Date  . Hypertension   . HNP (herniated nucleus pulposus), lumbar     l4-5  . Snoring   . DDD (degenerative disc disease), lumbar   . Primary localized osteoarthritis of left knee 12/29/2014  . Arthritis     "back" (12/29/2014)  . Kidney stones     Past Surgical History  Procedure Laterality Date  . Lithotripsy  X 1  . Cystoscopy w/ stone manipulation  ~ 2002  . Colonoscopy    . Lasik Bilateral 2008  . Total knee arthroplasty Left 12/29/2014  . Adenoidectomy  ~ 1970    "not tonsils"  . Total knee arthroplasty Left 12/29/2014    Procedure: LEFT TOTAL KNEE ARTHROPLASTY;  Surgeon: Elsie Saas, MD;  Location: [place];  Service: Orthopedics;  Laterality: Left;    There were no vitals filed for this visit.  Visit Diagnosis:  Decreased strength involving knee joint  Decreased range of motion of knee, left  Knee swelling, left  Gait abnormality  Left knee pain      Subjective Assessment - 02/03/15 1437    Subjective Knee is doing pretty good, back got sore from maybe stretching too much on it. Knee is doing good but the back is the problem today. Had to back off on the exercises a little.    Currently in Pain? Yes   Pain Score 2    Pain Location Back   Pain Orientation Right;Lower   Pain Descriptors /  Indicators --  sticking, weakness   Pain Onset In the past 7 days   Pain Frequency Occasional   Aggravating Factors  not sure   Pain Relieving Factors rest/stretches                         [facility] Adult PT Treatment/Exercise - 02/03/15 0001    Knee/Hip Exercises: Stretches   Passive Hamstring Stretch 3 reps;30 seconds  strap   Knee/Hip Exercises: Aerobic   Nustep L6 X 8 min   Knee/Hip Exercises: Standing   Lateral Step Up Left;1 set;10 reps;Step Height: 8"   Forward Step Up Left;1 set;10 reps;Step Height: 8"   Functional Squat 2 sets;10 reps  1X10 even, 1X10  Rt foot on 4 inch box   Other Standing Knee Exercises high knees/butt kicks 30 feet X2   Other Standing Knee Exercises line walk, retro amb 30 feet X2   Knee/Hip Exercises: Seated   Hamstring Curl Strengthening;Left;10 reps;2 sets                PT Education - 02/03/15 1456    Education provided Yes   Education Details squat technique   Person(s) Educated Patient   Methods Explanation;Demonstration;Tactile cues;Verbal cues   Comprehension Verbalized understanding;Returned demonstration  PT Short Term Goals - 01/14/15 1548    PT SHORT TERM GOAL #1   Title Patient to demo 0 degrees on Lt knee extension for gait stabiltiy.   Time 3   Period Weeks   Status New   PT SHORT TERM GOAL #2   Title Patient to ambulate without an assistive device.   Time 3   Period Weeks   Status New   PT SHORT TERM GOAL #3   Title Patient to be independent with progressive HEP   Time 2   Period Weeks   Status New           PT Long Term Goals - 01/14/15 1550    PT LONG TERM GOAL #1   Title Patient to demon 115 degrees of Lt knee flexion or greater for getting in/out of low vehicles.    Time 6   Period Weeks   Status New   PT LONG TERM GOAL #2   Title Patient to reports ability to ambulate 30 minutes without an assistive device for shopping tasks.    Time 6   Period Weeks   Status New   PT  LONG TERM GOAL #3   Title Patient to ambulate up/down 5 stairs step over step.    Time 6   Period Weeks   Status New   PT LONG TERM GOAL #4   Title Patient to demonstrate 4+/5 strength through Lt knee extension for lifting tasks and activity safety   Time 6   Period Weeks   Status New               Plan - 02/03/15 1503    Clinical Impression Statement Making steady progress iwth session with both ROM and strengthening. Will continue to progress strengthening as primary focus but follow ROM as needed. Began discussion of transition to independent program.   PT Next Visit Plan Strengthening focus, check knee extension, may need to increase knee flexion stretches if appropriate.    PT Home Exercise Plan continue with strengtheng program.   Consulted and Agree with Plan of Care Patient        Problem List Patient Active Problem List   Diagnosis Date Noted  . Primary localized osteoarthritis of left knee 12/29/2014  . DJD (degenerative joint disease) of knee 12/29/2014  . Tear of lateral meniscus of left knee 12/05/2013  . Primary osteoarthritis of left knee 11/07/2013  . Right-sided low back pain with right-sided sciatica 11/07/2013  . MUSCLE STRAIN, LEFT CALF 10/07/2009  . HERNIATED LUMBAR DISK WITH RADICULOPATHY 08/26/2008  . ADJ DISORDER WITH MIXED ANXIETY & DEPRESSED MOOD 07/30/2008    Linard Millers, PT 02/03/2015, 3:08 PM  [facility] [address] [place], [place], [address] Phone: [phone]   Fax:  [phone]  Name: Adam Avila MRN: [id number] Date of Birth: [birth year]

## 2015-02-05 ENCOUNTER — Ambulatory Visit: Payer: 59 | Admitting: Physical Therapy

## 2015-02-05 DIAGNOSIS — R29898 Other symptoms and signs involving the musculoskeletal system: Secondary | ICD-10-CM

## 2015-02-05 DIAGNOSIS — M24662 Ankylosis, left knee: Secondary | ICD-10-CM

## 2015-02-05 DIAGNOSIS — M25562 Pain in left knee: Secondary | ICD-10-CM

## 2015-02-05 DIAGNOSIS — M25462 Effusion, left knee: Secondary | ICD-10-CM

## 2015-02-05 DIAGNOSIS — R269 Unspecified abnormalities of gait and mobility: Secondary | ICD-10-CM

## 2015-02-05 NOTE — Therapy (Signed)
[place], [place], [address] Phone: [phone]   Fax:  [phone]  Physical Therapy Treatment  Patient Details  Name: Adam Avila MRN: [id number] Date of Birth: [birth year] Referring Provider: Elsie Saas  Encounter Date: 02/05/2015      PT End of Session - 02/05/15 1821    Visit Number 7   Number of Visits 12   Date for PT Re-Evaluation 02/13/15   PT Start Time 2446   PT Stop Time 1515   PT Time Calculation (min) 60 min   Activity Tolerance Patient tolerated treatment well;No increased pain   Behavior During Therapy [facility] for tasks assessed/performed      Past Medical History  Diagnosis Date  . Hypertension   . HNP (herniated nucleus pulposus), lumbar     l4-5  . Snoring   . DDD (degenerative disc disease), lumbar   . Primary localized osteoarthritis of left knee 12/29/2014  . Arthritis     "back" (12/29/2014)  . Kidney stones     Past Surgical History  Procedure Laterality Date  . Lithotripsy  X 1  . Cystoscopy w/ stone manipulation  ~ 2002  . Colonoscopy    . Lasik Bilateral 2008  . Total knee arthroplasty Left 12/29/2014  . Adenoidectomy  ~ 1970    "not tonsils"  . Total knee arthroplasty Left 12/29/2014    Procedure: LEFT TOTAL KNEE ARTHROPLASTY;  Surgeon: Elsie Saas, MD;  Location: [place];  Service: Orthopedics;  Laterality: Left;    There were no vitals filed for this visit.  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  1-2/10 Back RT,    Currently in Pain? Yes   Pain Score 2    Pain Location Knee   Pain Orientation Left;Anterior   Pain Descriptors / Indicators Aching   Aggravating Factors  stretches, sitting in one position too long.    Pain Relieving Factors  rest , exercise                         [facility] Adult PT Treatment/Exercise - 02/05/15 1435    Knee/Hip Exercises: Stretches   Sports administrator Limitations 10 X 10 prone with strap   Knee/Hip Exercises: Aerobic   Nustep L6 7 minutes   Knee/Hip Exercises: Machines for Strengthening   Cybex Knee Flexion 2 legs 3 plates, , 2 legs flex 1 length   Knee/Hip Exercises: Standing   Heel Raises 10 reps;2 sets  single squat   Lateral Step Up --  8 inches, forward and lateral ster 2 sets.     Forward Step Up 2 sets;15 reps;Step Height: 8"   Functional Squat Limitations Rt foot on 8 inch block   Other Standing Knee Exercises sit to stand with RT foot on 8" step 10 X   Knee/Hip Exercises: Supine   Quad Sets 10 reps   Knee/Hip Exercises: Prone   Hamstring Curl Limitations passive strap uses 10 x 10 seconds.     Modalities   Modalities --  cold pack , 10 minutes, leg elevated.                  PT Short Term Goals - 02/05/15 1824  PT SHORT TERM GOAL #1   Title Patient to demo 0 degrees on Lt knee extension for gait stabiltiy.   Time 3   Period Weeks   Status On-going   PT SHORT TERM GOAL #2   Title Patient to ambulate without an assistive device.   Baseline none in clinic   Time 3   Period Weeks   Status Partially Met   PT SHORT TERM GOAL #3   Title Patient to be independent with progressive HEP   Baseline independent so far   Time 2   Period Weeks   Status On-going           PT Long Term Goals - 02/05/15 1825    PT LONG TERM GOAL #1   Title Patient to demon 115 degrees of Lt knee flexion or greater for getting in/out of low vehicles.    Time 6   Period Weeks   Status Achieved   PT LONG TERM GOAL #2   Title Patient to reports ability to ambulate 30 minutes without an assistive device for shopping tasks.    Time 6   Period Weeks   Status Unable to assess   PT LONG TERM GOAL #3   Title Patient to ambulate up/down 5 stairs step over step.    Time 6    Period Weeks   Status Unable to assess   PT LONG TERM GOAL #4   Title Patient to demonstrate 4+/5 strength through Lt knee extension for lifting tasks and activity safety   Time 6   Period Weeks   Status Unable to assess               Plan - 02/05/15 1821    Clinical Impression Statement Patient looking forward to return to work.   He hops  MD will clear him to work next visit.  Patient has good ROM and strength 127  best PROM today.  Knee extension no mwasured but visually about 15 degrees  lacking   PT Next Visit Plan strengthen. Stretch flexion and extension.   PT Home Exercise Plan continue with strengtheng program.   Consulted and Agree with Plan of Care Patient        Problem List Patient Active Problem List   Diagnosis Date Noted  . Primary localized osteoarthritis of left knee 12/29/2014  . DJD (degenerative joint disease) of knee 12/29/2014  . Tear of lateral meniscus of left knee 12/05/2013  . Primary osteoarthritis of left knee 11/07/2013  . Right-sided low back pain with right-sided sciatica 11/07/2013  . MUSCLE STRAIN, LEFT CALF 10/07/2009  . HERNIATED LUMBAR DISK WITH RADICULOPATHY 08/26/2008  . ADJ DISORDER WITH MIXED ANXIETY & DEPRESSED MOOD 07/30/2008    [facility] 02/05/2015, 6:26 PM  [facility] [address] [place], [place], [address] Phone: [phone]   Fax:  [phone]  Name: Adam Avila MRN: [id number] Date of Birth: [birth year]    Melvenia Needles, PTA 02/05/2015 6:26 PM Phone: [phone] Fax: [phone]

## 2015-02-10 ENCOUNTER — Ambulatory Visit: Payer: 59 | Admitting: Physical Therapy

## 2015-02-10 DIAGNOSIS — M25562 Pain in left knee: Secondary | ICD-10-CM

## 2015-02-10 DIAGNOSIS — R269 Unspecified abnormalities of gait and mobility: Secondary | ICD-10-CM

## 2015-02-10 DIAGNOSIS — M25462 Effusion, left knee: Secondary | ICD-10-CM

## 2015-02-10 DIAGNOSIS — M24662 Ankylosis, left knee: Secondary | ICD-10-CM | POA: Diagnosis not present

## 2015-02-10 DIAGNOSIS — R29898 Other symptoms and signs involving the musculoskeletal system: Secondary | ICD-10-CM

## 2015-02-10 NOTE — Therapy (Signed)
[place], [place], [address] Phone: [phone]   Fax:  [phone]  Physical Therapy Treatment  Patient Details  Name: [name] MRN: [id number] Date of Birth: 1960-10-16 Referring Provider: Elsie Saas  Encounter Date: 02/10/2015      PT End of Session - 02/10/15 1509    Visit Number 8   Number of Visits 12   Date for PT Re-Evaluation 02/13/15   PT Start Time 8250   PT Stop Time 1507   PT Time Calculation (min) 45 min   Activity Tolerance Patient tolerated treatment well;No increased pain   Behavior During Therapy [facility] for tasks assessed/performed      Past Medical History  Diagnosis Date  . Hypertension   . HNP (herniated nucleus pulposus), lumbar     l4-5  . Snoring   . DDD (degenerative disc disease), lumbar   . Primary localized osteoarthritis of left knee 12/29/2014  . Arthritis     "back" (12/29/2014)  . Kidney stones     Past Surgical History  Procedure Laterality Date  . Lithotripsy  X 1  . Cystoscopy w/ stone manipulation  ~ 2002  . Colonoscopy    . Lasik Bilateral 2008  . Total knee arthroplasty Left 12/29/2014  . Adenoidectomy  ~ 1970    "not tonsils"  . Total knee arthroplasty Left 12/29/2014    Procedure: LEFT TOTAL KNEE ARTHROPLASTY;  Surgeon: Elsie Saas, MD;  Location: [place];  Service: Orthopedics;  Laterality: Left;    There were no vitals filed for this visit.  Visit Diagnosis:  Decreased strength involving knee joint  Decreased range of motion of knee, left  Knee swelling, left  Gait abnormality  Left knee pain      Subjective Assessment - 02/10/15 1424    Subjective Able to get into the hottub the other day and it felt great. States he was cleared to do that by his physician. Knee got a little stiff the other day.    Currently in Pain? Yes   Pain Score 1    Pain Location Knee   Pain Orientation Left   Pain Descriptors / Indicators Aching   Aggravating Factors  prolonged standing/walking   Pain Relieving Factors rest                         [facility] Adult PT Treatment/Exercise - 02/10/15 0001    Knee/Hip Exercises: Stretches   Passive Hamstring Stretch 3 reps;30 seconds  strap   Other Knee/Hip Stretches supine knee extension hang with overpressure   0 degrees by end of stretch   Knee/Hip Exercises: Aerobic   Recumbent Bike L3 X6 min   Knee/Hip Exercises: Standing   Lateral Step Up 2 sets;10 reps  8 inch step   Forward Step Up 2 sets;Step Height: 8";10 reps   Knee/Hip Exercises: Seated   Long Arc Quad Left;2 sets;10 reps;Other (comment)  manual resistance   Heel Slides AROM;Left;1 set;10 reps;Other (comment)  122 degrees flexion   Hamstring Curl Strengthening;Left;10 reps;2 sets   Hamstring Limitations blue t-band.                PT Education - 02/10/15 1509    Education provided Yes   Education Details discussion of plan of care   Person(s) Educated Patient   Methods Explanation   Comprehension Verbalized understanding          PT Short Term Goals - 02/05/15 1824  PT SHORT TERM GOAL #1   Title Patient to demo 0 degrees on Lt knee extension for gait stabiltiy.   Time 3   Period Weeks   Status On-going   PT SHORT TERM GOAL #2   Title Patient to ambulate without an assistive device.   Baseline none in clinic   Time 3   Period Weeks   Status Partially Met   PT SHORT TERM GOAL #3   Title Patient to be independent with progressive HEP   Baseline independent so far   Time 2   Period Weeks   Status On-going           PT Long Term Goals - 02/05/15 1825    PT LONG TERM GOAL #1   Title Patient to demon 115 degrees of Lt knee flexion or greater for getting in/out of low vehicles.    Time 6   Period Weeks   Status Achieved   PT LONG TERM GOAL #2   Title Patient to reports ability to ambulate 30 minutes without an assistive device for shopping tasks.    Time 6   Period  Weeks   Status Unable to assess   PT LONG TERM GOAL #3   Title Patient to ambulate up/down 5 stairs step over step.    Time 6   Period Weeks   Status Unable to assess   PT LONG TERM GOAL #4   Title Patient to demonstrate 4+/5 strength through Lt knee extension for lifting tasks and activity safety   Time 6   Period Weeks   Status Unable to assess               Plan - 02/10/15 1510    Clinical Impression Statement Patient making excellent gains with PT intervention. ROM: 0-122 degrees by end of session. Began discussion of D/C planning with patient who states that he is headed back to his physician tomorrow and will discuss returning to work.    PT Next Visit Plan Update on physican appointment. Finalize D/C plan with possibility of next session being last appt.    PT Home Exercise Plan Review HEP with possibility of D/C.    Consulted and Agree with Plan of Care Patient        Problem List Patient Active Problem List   Diagnosis Date Noted  . Primary localized osteoarthritis of left knee 12/29/2014  . DJD (degenerative joint disease) of knee 12/29/2014  . Tear of lateral meniscus of left knee 12/05/2013  . Primary osteoarthritis of left knee 11/07/2013  . Right-sided low back pain with right-sided sciatica 11/07/2013  . MUSCLE STRAIN, LEFT CALF 10/07/2009  . HERNIATED LUMBAR DISK WITH RADICULOPATHY 08/26/2008  . ADJ DISORDER WITH MIXED ANXIETY & DEPRESSED MOOD 07/30/2008    Linard Millers, PT 02/10/2015, 3:14 PM  [facility] [address] [place], [place], [address] Phone: [phone]   Fax:  [phone]  Name: Adam Avila MRN: [id number] Date of Birth: 1960-12-19

## 2015-02-12 ENCOUNTER — Ambulatory Visit: Payer: 59 | Admitting: Physical Therapy

## 2015-02-12 DIAGNOSIS — M24662 Ankylosis, left knee: Secondary | ICD-10-CM

## 2015-02-12 DIAGNOSIS — R29898 Other symptoms and signs involving the musculoskeletal system: Secondary | ICD-10-CM

## 2015-02-12 NOTE — Therapy (Signed)
[place], [place], [address] Phone: [phone]   Fax:  [phone]  Physical Therapy Treatment  Patient Details  Name: Adam Avila MRN: [id number] Date of Birth: 1960/09/20 Referring Provider: Elsie Saas  Encounter Date: 02/12/2015      PT End of Session - 02/12/15 1621    Visit Number 9   Number of Visits 12   Date for PT Re-Evaluation 02/13/15   PT Start Time 4709   PT Stop Time 1500   PT Time Calculation (min) 42 min   Activity Tolerance Patient tolerated treatment well;No increased pain   Behavior During Therapy [facility] for tasks assessed/performed      Past Medical History  Diagnosis Date  . Hypertension   . HNP (herniated nucleus pulposus), lumbar     l4-5  . Snoring   . DDD (degenerative disc disease), lumbar   . Primary localized osteoarthritis of left knee 12/29/2014  . Arthritis     "back" (12/29/2014)  . Kidney stones     Past Surgical History  Procedure Laterality Date  . Lithotripsy  X 1  . Cystoscopy w/ stone manipulation  ~ 2002  . Colonoscopy    . Lasik Bilateral 2008  . Total knee arthroplasty Left 12/29/2014  . Adenoidectomy  ~ 1970    "not tonsils"  . Total knee arthroplasty Left 12/29/2014    Procedure: LEFT TOTAL KNEE ARTHROPLASTY;  Surgeon: Elsie Saas, MD;  Location: [place];  Service: Orthopedics;  Laterality: Left;    There were no vitals filed for this visit.  Visit Diagnosis:  Decreased strength involving knee joint  Decreased range of motion of knee, left      Subjective Assessment - 02/12/15 1427    Subjective No pain but nausea.     Currently in Pain? No/denies            [facility] PT Assessment - 02/12/15 0001    AROM   Overall AROM  --  128 AROM, lacks 9 degrees extension,  reviewed how to get                      [facility] Adult PT Treatment/Exercise - 02/12/15 1428    Self-Care   Self-Care Other Self-Care Comments  Do's  don'ts with exercises at a gym.  IE no more than 50 LBS   Other Self-Care Comments  No treadmill/ running hops jumps,  Exercise options using his own body weight ie lunges  wall sits etc.    quite a bit of time with this discussion.   Knee/Hip Exercises: Aerobic   Recumbent Bike 6 minutes   Knee/Hip Exercises: Machines for Strengthening   Cybex Knee Extension 1 plate 2 up, 1 hold and lower 10 X 10 seconds   Cybex Knee Flexion 2 plates, 2 and 1 leg.  5 second hold.     Cybex Leg Press 10 X3 sets  LT leg                PT Education - 02/12/15 1620    Education provided Yes   Education Details Self care   Person(s) Educated Patient   Methods Explanation   Comprehension Verbalized understanding          PT Short Term Goals - 02/12/15 1625    PT SHORT TERM GOAL #1   Period Weeks           PT Long Term Goals - 02/12/15 1625  PT LONG TERM GOAL #1   Title Patient to demon 115 degrees of Lt knee flexion or greater for getting in/out of low vehicles.    Baseline 128   Time 6   Period Weeks   Status Achieved   PT LONG TERM GOAL #2   Title Patient to reports ability to ambulate 30 minutes without an assistive device for shopping tasks.    Time 6   Period Weeks   Status Achieved   PT LONG TERM GOAL #3   Title Patient to ambulate up/down 5 stairs step over step.    Time 6   Period Weeks   PT LONG TERM GOAL #4   Title Patient to demonstrate 4+/5 strength through Lt knee extension for lifting tasks and activity safety   Baseline 5/5   Time 6   Period Weeks   Status Achieved               Plan - 02/12/15 1621    Clinical Impression Statement MD said OK for D/C and OK to exercise at the gym.  Actual things to do at gym practiced today.  many questions answered All goals met except for lacking full knee extension.  Patient is ready for discharge to home exercise program.  He is to return to work in 1 more week starting with 1/2 days.    PT Next Visit Plan D/C    PT Home Exercise Plan At home/gym stretch and strengthen   Consulted and Agree with Plan of Care Patient        Problem List Patient Active Problem List   Diagnosis Date Noted  . Primary localized osteoarthritis of left knee 12/29/2014  . DJD (degenerative joint disease) of knee 12/29/2014  . Tear of lateral meniscus of left knee 12/05/2013  . Primary osteoarthritis of left knee 11/07/2013  . Right-sided low back pain with right-sided sciatica 11/07/2013  . MUSCLE STRAIN, LEFT CALF 10/07/2009  . HERNIATED LUMBAR DISK WITH RADICULOPATHY 08/26/2008  . ADJ DISORDER WITH MIXED ANXIETY & DEPRESSED MOOD 07/30/2008    HARRIS,KAREN 02/12/2015, 4:27 PM  [facility] [address] [place], [place], [address] Phone: [phone]   Fax:  [phone]  Name: Adam Avila MRN: [id number] Date of Birth: 1960/05/16    Melvenia Needles, PTA 02/12/2015 4:27 PM Phone: [phone] Fax: [phone]  PHYSICAL THERAPY DISCHARGE SUMMARY  Visits from Start of Care: 01/14/15  Current functional level related to goals / functional outcomes: As noted above   Remaining deficits: As noted above   Education / Equipment: As noted above Plan: Patient agrees to discharge.  Patient goals were met. Patient is being discharged due to meeting the stated rehab goals.  ?????       Cassell Clement, PT, [place] Pager [phone] Office [phone]

## 2015-03-02 MED FILL — LOSARTAN-HCTZ 100-12.5 MG T: 100-12.5 | 90 days supply | Qty: 90 | Fill #3

## 2015-03-04 MED FILL — AMOXICILLIN 500 MG CAPSULE: 500 | 30 days supply | Qty: 12 | Fill #0

## 2015-03-07 DIAGNOSIS — G4733 Obstructive sleep apnea (adult) (pediatric): Secondary | ICD-10-CM | POA: Diagnosis not present

## 2015-03-26 DIAGNOSIS — Z96652 Presence of left artificial knee joint: Secondary | ICD-10-CM | POA: Diagnosis not present

## 2015-04-07 DIAGNOSIS — G4733 Obstructive sleep apnea (adult) (pediatric): Secondary | ICD-10-CM | POA: Diagnosis not present

## 2015-05-05 DIAGNOSIS — G4733 Obstructive sleep apnea (adult) (pediatric): Secondary | ICD-10-CM | POA: Diagnosis not present

## 2015-05-14 DIAGNOSIS — G4733 Obstructive sleep apnea (adult) (pediatric): Secondary | ICD-10-CM | POA: Diagnosis not present

## 2015-05-19 DIAGNOSIS — Z125 Encounter for screening for malignant neoplasm of prostate: Secondary | ICD-10-CM | POA: Diagnosis not present

## 2015-05-19 DIAGNOSIS — Z0000 Encounter for general adult medical examination without abnormal findings: Secondary | ICD-10-CM | POA: Diagnosis not present

## 2015-05-19 DIAGNOSIS — Z13220 Encounter for screening for lipoid disorders: Secondary | ICD-10-CM | POA: Diagnosis not present

## 2015-05-19 MED FILL — SILDENAFIL 20 MG TABLET: 20 | 6 days supply | Qty: 30 | Fill #0

## 2015-05-21 DIAGNOSIS — G4733 Obstructive sleep apnea (adult) (pediatric): Secondary | ICD-10-CM | POA: Diagnosis not present

## 2015-05-29 MED FILL — LOSARTAN-HCTZ 100-12.5 MG T: 100-12.5 | 90 days supply | Qty: 90 | Fill #0

## 2015-06-05 DIAGNOSIS — G4733 Obstructive sleep apnea (adult) (pediatric): Secondary | ICD-10-CM | POA: Diagnosis not present

## 2015-06-26 MED FILL — SILDENAFIL 20 MG TABLET: 20 | 6 days supply | Qty: 30 | Fill #1

## 2015-06-29 ENCOUNTER — Telehealth: Payer: 59 | Admitting: Family

## 2015-06-29 DIAGNOSIS — J069 Acute upper respiratory infection, unspecified: Secondary | ICD-10-CM

## 2015-06-29 DIAGNOSIS — B9689 Other specified bacterial agents as the cause of diseases classified elsewhere: Secondary | ICD-10-CM

## 2015-06-29 DIAGNOSIS — M1712 Unilateral primary osteoarthritis, left knee: Secondary | ICD-10-CM | POA: Diagnosis not present

## 2015-06-29 MED ORDER — BENZONATATE 100 MG PO CAPS
100.0000 mg | ORAL_CAPSULE | Freq: Three times a day (TID) | ORAL | Status: DC | PRN
Start: 2015-06-29 — End: 2015-11-18

## 2015-06-29 MED ORDER — AZITHROMYCIN 250 MG PO TABS: ORAL_TABLET | ORAL | Status: DC

## 2015-06-29 MED FILL — AZITHROMYCIN 250 MG TABLET: 250 | 5 days supply | Qty: 6 | Fill #0

## 2015-06-29 MED FILL — BENZONATATE 100 MG CAPSULE: 100 | 5 days supply | Qty: 30 | Fill #0

## 2015-06-29 NOTE — Progress Notes (Signed)

## 2015-07-05 DIAGNOSIS — G4733 Obstructive sleep apnea (adult) (pediatric): Secondary | ICD-10-CM | POA: Diagnosis not present

## 2015-08-13 MED FILL — SILDENAFIL 20 MG TABLET: 20 | 6 days supply | Qty: 30 | Fill #2

## 2015-09-04 MED FILL — LOSARTAN-HCTZ 100-12.5 MG T: 100-12.5 | 90 days supply | Qty: 90 | Fill #1

## 2015-09-09 DIAGNOSIS — G4733 Obstructive sleep apnea (adult) (pediatric): Secondary | ICD-10-CM | POA: Diagnosis not present

## 2015-09-10 MED FILL — SILDENAFIL 20 MG TABLET: 20 | 6 days supply | Qty: 30 | Fill #3

## 2015-11-04 MED FILL — SILDENAFIL 20 MG TABLET: 20 | 6 days supply | Qty: 30 | Fill #4

## 2015-11-18 ENCOUNTER — Other Ambulatory Visit: Payer: Self-pay | Admitting: Family

## 2015-11-18 ENCOUNTER — Telehealth: Payer: 59 | Admitting: Family

## 2015-11-18 DIAGNOSIS — B9789 Other viral agents as the cause of diseases classified elsewhere: Secondary | ICD-10-CM | POA: Diagnosis not present

## 2015-11-18 DIAGNOSIS — B9689 Other specified bacterial agents as the cause of diseases classified elsewhere: Secondary | ICD-10-CM

## 2015-11-18 DIAGNOSIS — J069 Acute upper respiratory infection, unspecified: Principal | ICD-10-CM

## 2015-11-18 MED ORDER — BENZONATATE 100 MG PO CAPS: 100.0000 mg | ORAL_CAPSULE | Freq: Three times a day (TID) | ORAL | 0 refills | Status: DC | PRN

## 2015-11-18 NOTE — Progress Notes (Signed)

## 2015-11-19 MED FILL — BENZONATATE 100 MG CAPSULE: 100 | 5 days supply | Qty: 30 | Fill #0

## 2015-12-08 MED FILL — LOSARTAN-HCTZ 100-12.5 MG T: 100-12.5 | 90 days supply | Qty: 90 | Fill #0

## 2015-12-08 MED FILL — SILDENAFIL 20 MG TABLET: 20 | 6 days supply | Qty: 30 | Fill #5

## 2015-12-16 ENCOUNTER — Telehealth: Payer: 59 | Admitting: Family

## 2015-12-16 DIAGNOSIS — J209 Acute bronchitis, unspecified: Secondary | ICD-10-CM | POA: Diagnosis not present

## 2015-12-16 MED ORDER — AZITHROMYCIN 250 MG PO TABS: ORAL_TABLET | ORAL | 0 refills | Status: DC

## 2015-12-16 MED ORDER — BENZONATATE 100 MG PO CAPS: 100.0000 mg | ORAL_CAPSULE | Freq: Three times a day (TID) | ORAL | 0 refills | Status: DC | PRN

## 2015-12-16 MED FILL — BENZONATATE 100 MG CAPSULE: 100 | 5 days supply | Qty: 30 | Fill #0

## 2015-12-16 MED FILL — AZITHROMYCIN 250 MG TABLET: 250 | 5 days supply | Qty: 6 | Fill #0

## 2015-12-16 NOTE — Progress Notes (Signed)

## 2015-12-31 DIAGNOSIS — J329 Chronic sinusitis, unspecified: Secondary | ICD-10-CM | POA: Diagnosis not present

## 2015-12-31 DIAGNOSIS — R05 Cough: Secondary | ICD-10-CM | POA: Diagnosis not present

## 2015-12-31 MED FILL — AMOX-CLAV 875-125 MG TABLET: 875-125 | 10 days supply | Qty: 20 | Fill #0

## 2015-12-31 MED FILL — CHERATUSSIN AC SYRUP: 100-10 | 6 days supply | Qty: 120 | Fill #0

## 2016-01-05 DIAGNOSIS — M1712 Unilateral primary osteoarthritis, left knee: Secondary | ICD-10-CM | POA: Diagnosis not present

## 2016-01-05 MED FILL — MUPIROCIN 2% OINTMENT: 2 | 14 days supply | Qty: 44 | Fill #0

## 2016-01-11 MED FILL — SILDENAFIL 20 MG TABLET: 20 | 30 days supply | Qty: 30 | Fill #0

## 2016-02-29 MED FILL — LOSARTAN-HCTZ 100-12.5 MG T: 100-12.5 | 90 days supply | Qty: 90 | Fill #1

## 2016-03-18 ENCOUNTER — Telehealth: Payer: 59 | Admitting: Nurse Practitioner

## 2016-03-18 DIAGNOSIS — H6502 Acute serous otitis media, left ear: Secondary | ICD-10-CM | POA: Diagnosis not present

## 2016-03-18 DIAGNOSIS — H9201 Otalgia, right ear: Secondary | ICD-10-CM

## 2016-03-18 DIAGNOSIS — R51 Headache: Secondary | ICD-10-CM | POA: Diagnosis not present

## 2016-03-18 NOTE — Progress Notes (Signed)
Based on what you shared with me it looks like you have a serious condition that should be evaluated in a face to face office visit.  NOTE: Even if you have entered your credit card information for this eVisit, you will not be charged.   If you are having a true medical emergency please call 911.  If you need an urgent face to face visit, [place] has four urgent care centers for your convenience.  If you need care fast and have a high deductible or no insurance consider:   [email or web address]  [phone]  [address], [address] [place], [place] [address] 8 am to 8 pm Monday-Friday 10 am to 4 pm Saturday-Sunday   The following sites will take your  insurance:    . [facility]  [phone] Get Driving Directions Find a Provider at this Location  [address] [place], [place] [address] . 10 am to 8 pm Monday-Friday . 12 pm to 8 pm Saturday-Sunday   . [facility]  [phone] Get Driving Directions Find a Provider at this Location  [address], [address] [place], [place] [address] . 8 am to 8 pm Monday-Friday . 9 am to 6 pm Saturday . 11 am to 6 pm Sunday   . [facility] [place]  [phone] Get Driving Directions  [address] [place], [place] [address] . 8 am to 8 pm Monday-Friday . 8 am to 4 pm Saturday-Sunday   Your e-visit answers were reviewed by a board certified advanced clinical practitioner to complete your personal care plan.  Thank you for using e-Visits.  

## 2016-04-27 MED FILL — SILDENAFIL 20 MG TABLET: 20 | 30 days supply | Qty: 30 | Fill #1

## 2016-05-18 DIAGNOSIS — Z0000 Encounter for general adult medical examination without abnormal findings: Secondary | ICD-10-CM | POA: Diagnosis not present

## 2016-05-19 DIAGNOSIS — I10 Essential (primary) hypertension: Secondary | ICD-10-CM | POA: Diagnosis not present

## 2016-05-19 DIAGNOSIS — Z0000 Encounter for general adult medical examination without abnormal findings: Secondary | ICD-10-CM | POA: Diagnosis not present

## 2016-05-19 DIAGNOSIS — Z23 Encounter for immunization: Secondary | ICD-10-CM | POA: Diagnosis not present

## 2016-05-19 MED FILL — AMOXICILLIN 500 MG CAPSULE: 500 | 90 days supply | Qty: 20 | Fill #0

## 2016-06-06 MED FILL — LOSARTAN-HCTZ 100-12.5 MG T: 100-12.5 | 90 days supply | Qty: 90 | Fill #0

## 2016-06-09 MED FILL — SILDENAFIL 20 MG TABLET: 20 | 10 days supply | Qty: 50 | Fill #0

## 2016-08-29 MED FILL — SILDENAFIL 20 MG TABLET: 20 | 10 days supply | Qty: 50 | Fill #1

## 2016-08-30 MED FILL — LOSARTAN-HCTZ 100-12.5 MG T: 100-12.5 | 90 days supply | Qty: 90 | Fill #1

## 2016-10-10 DIAGNOSIS — H524 Presbyopia: Secondary | ICD-10-CM | POA: Diagnosis not present

## 2016-10-11 DIAGNOSIS — G4733 Obstructive sleep apnea (adult) (pediatric): Secondary | ICD-10-CM | POA: Diagnosis not present

## 2016-11-02 DIAGNOSIS — D225 Melanocytic nevi of trunk: Secondary | ICD-10-CM | POA: Diagnosis not present

## 2016-11-02 DIAGNOSIS — L821 Other seborrheic keratosis: Secondary | ICD-10-CM | POA: Diagnosis not present

## 2016-11-02 DIAGNOSIS — D1801 Hemangioma of skin and subcutaneous tissue: Secondary | ICD-10-CM | POA: Diagnosis not present

## 2016-11-02 DIAGNOSIS — L570 Actinic keratosis: Secondary | ICD-10-CM | POA: Diagnosis not present

## 2016-11-16 MED FILL — LOSARTAN-HCTZ 100-12.5 MG T: 100-12.5 | 90 days supply | Qty: 90 | Fill #2

## 2016-12-27 MED FILL — SILDENAFIL 20 MG TABLET: 20 | 6 days supply | Qty: 30 | Fill #2

## 2017-01-02 DIAGNOSIS — M1712 Unilateral primary osteoarthritis, left knee: Secondary | ICD-10-CM | POA: Diagnosis not present

## 2017-02-21 MED FILL — SILDENAFIL 20 MG TABLET: 20 | 10 days supply | Qty: 50 | Fill #2

## 2017-02-21 MED FILL — LOSARTAN-HCTZ 100-12.5 MG T: 100-12.5 | 90 days supply | Qty: 90 | Fill #3

## 2017-04-10 MED FILL — SILDENAFIL 20 MG TABLET: 20 | 10 days supply | Qty: 50 | Fill #3

## 2017-05-02 DIAGNOSIS — D485 Neoplasm of uncertain behavior of skin: Secondary | ICD-10-CM | POA: Diagnosis not present

## 2017-05-02 DIAGNOSIS — L570 Actinic keratosis: Secondary | ICD-10-CM | POA: Diagnosis not present

## 2017-05-02 DIAGNOSIS — D225 Melanocytic nevi of trunk: Secondary | ICD-10-CM | POA: Diagnosis not present

## 2017-05-02 DIAGNOSIS — D1801 Hemangioma of skin and subcutaneous tissue: Secondary | ICD-10-CM | POA: Diagnosis not present

## 2017-05-02 DIAGNOSIS — L821 Other seborrheic keratosis: Secondary | ICD-10-CM | POA: Diagnosis not present

## 2017-05-03 DIAGNOSIS — D225 Melanocytic nevi of trunk: Secondary | ICD-10-CM | POA: Diagnosis not present

## 2017-05-18 DIAGNOSIS — G4733 Obstructive sleep apnea (adult) (pediatric): Secondary | ICD-10-CM | POA: Diagnosis not present

## 2017-05-22 MED FILL — LOSARTAN-HCTZ 100-12.5 MG T: 100-12.5 | 90 days supply | Qty: 90 | Fill #0

## 2017-05-30 DIAGNOSIS — Z0000 Encounter for general adult medical examination without abnormal findings: Secondary | ICD-10-CM | POA: Diagnosis not present

## 2017-05-30 DIAGNOSIS — I10 Essential (primary) hypertension: Secondary | ICD-10-CM | POA: Diagnosis not present

## 2017-05-30 DIAGNOSIS — R972 Elevated prostate specific antigen [PSA]: Secondary | ICD-10-CM | POA: Diagnosis not present

## 2017-05-30 DIAGNOSIS — M179 Osteoarthritis of knee, unspecified: Secondary | ICD-10-CM | POA: Diagnosis not present

## 2017-05-30 MED FILL — SILDENAFIL 20 MG TABLET: 20 | 30 days supply | Qty: 50 | Fill #0

## 2017-07-04 MED FILL — AMOXICILLIN 500 MG CAPSULE: 500 | 5 days supply | Qty: 20 | Fill #0

## 2017-07-20 ENCOUNTER — Telehealth: Payer: 59 | Admitting: Physician Assistant

## 2017-07-20 DIAGNOSIS — B9689 Other specified bacterial agents as the cause of diseases classified elsewhere: Secondary | ICD-10-CM

## 2017-07-20 DIAGNOSIS — J208 Acute bronchitis due to other specified organisms: Secondary | ICD-10-CM | POA: Diagnosis not present

## 2017-07-20 MED ORDER — BENZONATATE 100 MG PO CAPS
100.0000 mg | ORAL_CAPSULE | Freq: Two times a day (BID) | ORAL | 0 refills | Status: DC | PRN
Start: 2017-07-20 — End: 2019-11-06

## 2017-07-20 MED ORDER — DOXYCYCLINE HYCLATE 100 MG PO CAPS: 100.0000 mg | ORAL_CAPSULE | Freq: Two times a day (BID) | ORAL | 0 refills | Status: DC

## 2017-07-20 NOTE — Progress Notes (Signed)

## 2017-07-21 MED FILL — BENZONATATE 100 MG CAPS: 100 | 10 days supply | Qty: 20 | Fill #0

## 2017-07-21 MED FILL — DOXYCYCLINE HYCLATE 100 MG: 100 | 7 days supply | Qty: 14 | Fill #0

## 2017-08-30 MED FILL — LOSARTAN-HCTZ 100-12.5 MG T: 100-12.5 | 90 days supply | Qty: 90 | Fill #1

## 2017-09-29 MED FILL — SILDENAFIL 20 MG TABLET: 20 | 30 days supply | Qty: 50 | Fill #1

## 2017-11-02 DIAGNOSIS — D229 Melanocytic nevi, unspecified: Secondary | ICD-10-CM | POA: Diagnosis not present

## 2017-11-02 DIAGNOSIS — L570 Actinic keratosis: Secondary | ICD-10-CM | POA: Diagnosis not present

## 2017-11-02 DIAGNOSIS — M7981 Nontraumatic hematoma of soft tissue: Secondary | ICD-10-CM | POA: Diagnosis not present

## 2017-11-02 DIAGNOSIS — B351 Tinea unguium: Secondary | ICD-10-CM | POA: Diagnosis not present

## 2017-11-02 DIAGNOSIS — L821 Other seborrheic keratosis: Secondary | ICD-10-CM | POA: Diagnosis not present

## 2017-11-28 MED FILL — LOSARTAN-HCTZ 100-12.5 MG T: 100-12.5 | 90 days supply | Qty: 90 | Fill #2

## 2017-11-30 MED FILL — SM BLOOD PRESSURE MONITOR: 30 days supply | Qty: 1 | Fill #0

## 2017-12-25 ENCOUNTER — Other Ambulatory Visit (INDEPENDENT_AMBULATORY_CARE_PROVIDER_SITE_OTHER): Payer: Self-pay | Admitting: Nurse Practitioner

## 2018-02-02 MED FILL — SILDENAFIL CITRATE 20 MG TA: 20 | 30 days supply | Qty: 50 | Fill #2

## 2018-03-05 MED FILL — LOSARTAN-HCTZ 100-12.5 MG T: 100-12.5 | 30 days supply | Qty: 30 | Fill #3

## 2018-03-22 ENCOUNTER — Telehealth: Payer: 59 | Admitting: Family

## 2018-03-22 DIAGNOSIS — Z719 Counseling, unspecified: Secondary | ICD-10-CM

## 2018-03-22 NOTE — Progress Notes (Signed)
Thank you for the details you included in the comment boxes. Those details are very helpful in determining the best course of treatment for you and help [place] to provide the best care. If it feels like something is in your eye but you're not having other symptoms of pinkeye, going on for 3 days, we need to do an eye exam.  Based on what you shared with me it looks like you have a serious condition that should be evaluated in a face to face office visit.  NOTE: If you entered your credit card information for this eVisit, you will not be charged. You may see a "hold" on your card for the $30 but that hold will drop off and you will not have a charge processed.  If you are having a true medical emergency please call 911.  If you need an urgent face to face visit, [facility] [place] has four urgent care centers for your convenience.  If you need care fast and have a high deductible or no insurance consider:   [email or web address] to reserve your spot online an avoid wait times  [facility] [address] [place], [place] [address] 8 am to 8 pm Monday-Friday 10 am to 4 pm Saturday-Sunday *Across the street from [employer]  [place], [address] 8 am to 5 pm Monday-Friday * In the [facility] on the [facility]   The following sites will take your  insurance:  . [facility] Urgent [place] a Provider at this Location  [address] [place], [place] [address] . 10 am to 8 pm Monday-Friday . 12 pm to 8 pm Saturday-Sunday   . [facility] Urgent Care at [place] a Provider at this Location  [place], [place], [place] [address] . 8 am to 8 pm Monday-Friday . 9 am to 6 pm Saturday . 11 am to 6 pm Sunday   . [facility] Urgent Care at [place] Get Driving Directions    [address] [place], [place] [address] . 8 am to 8 pm Monday-Friday . 8 am to 4 pm Saturday-Sunday   Your e-visit answers were reviewed by a board certified advanced clinical practitioner to complete your personal care plan.  Thank you for using e-Visits.

## 2018-03-30 DIAGNOSIS — H52223 Regular astigmatism, bilateral: Secondary | ICD-10-CM | POA: Diagnosis not present

## 2018-03-30 DIAGNOSIS — H5203 Hypermetropia, bilateral: Secondary | ICD-10-CM | POA: Diagnosis not present

## 2018-03-30 DIAGNOSIS — H524 Presbyopia: Secondary | ICD-10-CM | POA: Diagnosis not present

## 2018-03-30 MED FILL — LOSARTAN POTASSIUM 100 MG T: 100 | 90 days supply | Qty: 90 | Fill #0

## 2018-03-30 MED FILL — HYDROCHLOROTHIAZIDE 12.5 MG: 12.5 | 90 days supply | Qty: 90 | Fill #0

## 2018-04-18 ENCOUNTER — Telehealth: Payer: 59 | Admitting: Nurse Practitioner

## 2018-04-18 DIAGNOSIS — R6889 Other general symptoms and signs: Secondary | ICD-10-CM | POA: Diagnosis not present

## 2018-04-18 NOTE — Progress Notes (Signed)
E visit for Flu like symptoms   We are sorry that you are not feeling well.  Here is how we plan to help! Based on what you have shared with me it looks like you may have flu-like symptoms that should be watched but do not seem to indicate anti-viral treatment.  Influenza or "the flu" is   an infection caused by a respiratory virus. The flu virus is highly contagious and persons who did not receive their yearly flu vaccination may "catch" the flu from close contact.  We have anti-viral medications to treat the viruses that cause this infection. They are not a "cure" and only shorten the course of the infection. These prescriptions are most effective when they are given within the first 2 days of "flu" symptoms. Antiviral medication are indicated if you have a high risk of complications from the flu. You should  also consider an antiviral medication if you are in close contact with someone who is at risk. These medications can help patients avoid complications from the flu  but have side effects that you should know. Possible side effects from Tamiflu or oseltamivir include nausea, vomiting, diarrhea, dizziness, headaches, eye redness, sleep problems or other respiratory symptoms. You should not take Tamiflu if you have an allergy to oseltamivir or any to the ingredients in Tamiflu.  Based upon your symptoms and potential risk factors I recommend that you follow the flu symptoms recommendation that I have listed below.  ANYONE WHO HAS FLU SYMPTOMS SHOULD: . Stay home. The flu is highly contagious and going out or to work exposes others! . Be sure to drink plenty of fluids. Water is fine as well as fruit juices, sodas and electrolyte beverages. You may want to stay away from caffeine or alcohol. If you are nauseated, try taking small sips of liquids. How do you know if you are getting enough fluid? Your urine should be a pale yellow or almost colorless. . Get rest. . Taking a steamy shower or using a  humidifier may help nasal congestion and ease sore throat pain. Using a saline nasal spray works much the same way. . Cough drops, hard candies and sore throat lozenges may ease your cough. . Line up a caregiver. Have someone check on you regularly.   GET HELP RIGHT AWAY IF: . You cannot keep down liquids or your medications. . You become short of breath . Your fell like you are going to pass out or loose consciousness. . Your symptoms persist after you have completed your treatment plan MAKE SURE YOU   Understand these instructions.  Will watch your condition.  Will get help right away if you are not doing well or get worse.  Your e-visit answers were reviewed by a board certified advanced clinical practitioner to complete your personal care plan.  Depending on the condition, your plan could have included both over the counter or prescription medications.  If there is a problem please reply  once you have received a response from your provider.  Your safety is important to us.  If you have drug allergies check your prescription carefully.    You can use MyChart to ask questions about today's visit, request a non-urgent call back, or ask for a work or school excuse for 24 hours related to this e-Visit. If it has been greater than 24 hours you will need to follow up with your provider, or enter a new e-Visit to address those concerns.  You will get an e-mail   in the next two days asking about your experience.  I hope that your e-visit has been valuable and will speed your recovery. Thank you for using e-visits.  5 minutes spent reviewing and documenting in chart.  

## 2018-04-30 MED FILL — AMOXICILLIN 500 MG CAPSULE: 500 | 3 days supply | Qty: 12 | Fill #0

## 2018-05-01 DIAGNOSIS — G4733 Obstructive sleep apnea (adult) (pediatric): Secondary | ICD-10-CM | POA: Diagnosis not present

## 2018-05-22 DIAGNOSIS — G4733 Obstructive sleep apnea (adult) (pediatric): Secondary | ICD-10-CM | POA: Diagnosis not present

## 2018-06-25 MED FILL — LOSARTAN POTASSIUM 100 MG T: 100 | 30 days supply | Qty: 30 | Fill #0

## 2018-06-25 MED FILL — HYDROCHLOROTHIAZIDE 12.5 MG: 12.5 | 90 days supply | Qty: 90 | Fill #0

## 2018-07-27 MED FILL — LOSARTAN POTASSIUM 100 MG T: 100 | 90 days supply | Qty: 90 | Fill #0

## 2018-09-14 MED FILL — SILDENAFIL CITRATE 20 MG TA: 20 | 30 days supply | Qty: 50 | Fill #0

## 2018-09-26 MED FILL — SILDENAFIL CITRATE 20 MG TA: 20 | 30 days supply | Qty: 50 | Fill #0

## 2018-09-26 MED FILL — HYDROCHLOROTHIAZIDE 12.5 MG: 12.5 | 90 days supply | Qty: 90 | Fill #0

## 2018-10-29 MED FILL — LOSARTAN POTASSIUM 100 MG T: 100 | 60 days supply | Qty: 60 | Fill #1

## 2018-11-06 DIAGNOSIS — L570 Actinic keratosis: Secondary | ICD-10-CM | POA: Diagnosis not present

## 2018-11-06 DIAGNOSIS — D225 Melanocytic nevi of trunk: Secondary | ICD-10-CM | POA: Diagnosis not present

## 2018-11-06 DIAGNOSIS — L814 Other melanin hyperpigmentation: Secondary | ICD-10-CM | POA: Diagnosis not present

## 2018-11-06 DIAGNOSIS — D1801 Hemangioma of skin and subcutaneous tissue: Secondary | ICD-10-CM | POA: Diagnosis not present

## 2018-11-06 DIAGNOSIS — L821 Other seborrheic keratosis: Secondary | ICD-10-CM | POA: Diagnosis not present

## 2018-11-06 DIAGNOSIS — B351 Tinea unguium: Secondary | ICD-10-CM | POA: Diagnosis not present

## 2018-12-24 MED FILL — LOSARTAN POTASSIUM 100 MG T: 100 | 30 days supply | Qty: 30 | Fill #0

## 2018-12-24 MED FILL — HYDROCHLOROTHIAZIDE 12.5 MG: 12.5 | 90 days supply | Qty: 90 | Fill #0

## 2019-01-24 MED FILL — SILDENAFIL CITRATE 20 MG TA: 20 | 30 days supply | Qty: 50 | Fill #0

## 2019-01-25 MED FILL — LOSARTAN POTASSIUM 100 MG T: 100 | 30 days supply | Qty: 30 | Fill #1

## 2019-02-25 MED FILL — LOSARTAN POTASSIUM 100 MG T: 100 | 30 days supply | Qty: 30 | Fill #2

## 2019-03-08 DIAGNOSIS — G4733 Obstructive sleep apnea (adult) (pediatric): Secondary | ICD-10-CM | POA: Diagnosis not present

## 2019-03-21 ENCOUNTER — Telehealth: Payer: 59 | Admitting: Physician Assistant

## 2019-03-21 DIAGNOSIS — R319 Hematuria, unspecified: Secondary | ICD-10-CM

## 2019-03-21 NOTE — Progress Notes (Signed)
Based on what you shared with me, I feel your condition warrants further evaluation and I recommend that you be seen for a face to face visit.  Please contact your primary care physician practice to be seen. Many offices offer virtual options to be seen via video if you are not comfortable going in person to a medical facility at this time.  If you do not have a PCP, [facility] offers a free physician referral service available at [phone]. Our trained staff has the experience, knowledge and resources to put you in touch with a physician who is right for you.   You also have the option of a video visit through [email or web address]  If you are having a true medical emergency please call 911.  NOTE: If you entered your credit card information for this eVisit, you will not be charged. You may see a "hold" on your card for the $35 but that hold will drop off and you will not have a charge processed.  Your e-visit answers were reviewed by a board certified advanced clinical practitioner to complete your personal care plan.  Thank you for using e-Visits.  Based on what you shared with me, I feel your condition warrants further evaluation and I recommend that you be seen for a face to face office visit.   NOTE: If you entered your credit card information for this eVisit, you will not be charged. You may see a "hold" on your card for the $35 but that hold will drop off and you will not have a charge processed.   If you are having a true medical emergency please call 911.      For an urgent face to face visit, [facility] [facility] [place] has five urgent care centers for your convenience:      NEW:  [facility] Urgent [place] at [place] Get Driving Directions [id number] [place], [place] [address] . 10 am - 6pm Monday - Friday    [place] [facility] [place] [facility]) Get Driving Directions [id number] [address] [place], [place]  [address] . 10 am to 8 pm Monday-Friday . 12 pm to 8 pm [facility] Care at [facility] Get Driving Directions [id number] [place], [place], [place] [address] . 8 am to 8 pm Monday-Friday . 9 am to 6 pm Saturday . 11 am to 6 pm Sunday     [facility]gent [facility] at [facility] Get Driving Directions  [id number] [address] [place], [place] [address] . 8 am to 8 pm Monday-Friday . 8 am to 4 pm [facility] Urgent Care at [place] Get Driving Directions [id number] [place], [place] [address] . 12 pm to 6 pm Monday-Friday      Your e-visit answers were reviewed by a board certified advanced clinical practitioner to complete your personal care plan.  Thank you for using e-Visits.  Particia Nearing PA-C  Approximately 5 minutes was spent documenting and reviewing patient's chart.

## 2019-03-22 MED FILL — LOSARTAN POTASSIUM 100 MG T: 100 | 30 days supply | Qty: 30 | Fill #3

## 2019-03-22 MED FILL — HYDROCHLOROTHIAZIDE 12.5 MG: 12.5 | 90 days supply | Qty: 90 | Fill #1

## 2019-03-28 MED FILL — SILDENAFIL CITRATE 20 MG TA: 20 | 30 days supply | Qty: 50 | Fill #0

## 2019-04-17 ENCOUNTER — Other Ambulatory Visit (HOSPITAL_COMMUNITY): Payer: Self-pay | Admitting: Family Medicine

## 2019-04-17 DIAGNOSIS — H52223 Regular astigmatism, bilateral: Secondary | ICD-10-CM | POA: Diagnosis not present

## 2019-04-17 DIAGNOSIS — Z0000 Encounter for general adult medical examination without abnormal findings: Secondary | ICD-10-CM | POA: Diagnosis not present

## 2019-04-17 DIAGNOSIS — I10 Essential (primary) hypertension: Secondary | ICD-10-CM | POA: Diagnosis not present

## 2019-04-17 DIAGNOSIS — Z13220 Encounter for screening for lipoid disorders: Secondary | ICD-10-CM | POA: Diagnosis not present

## 2019-04-17 DIAGNOSIS — H524 Presbyopia: Secondary | ICD-10-CM | POA: Diagnosis not present

## 2019-04-17 DIAGNOSIS — Z23 Encounter for immunization: Secondary | ICD-10-CM | POA: Diagnosis not present

## 2019-04-17 MED FILL — LOSARTAN POTASSIUM 100 MG T: 100 | 90 days supply | Qty: 90 | Fill #0

## 2019-05-28 DIAGNOSIS — G4733 Obstructive sleep apnea (adult) (pediatric): Secondary | ICD-10-CM | POA: Diagnosis not present

## 2019-06-05 MED FILL — SILDENAFIL 20 MG TABLET: 20 | 30 days supply | Qty: 50 | Fill #0

## 2019-06-12 MED FILL — HYDROCHLOROTHIAZIDE 12.5 MG: 12.5 | 90 days supply | Qty: 90 | Fill #0

## 2019-06-12 MED FILL — AMOXICILLIN 500 MG CAPSULE: 500 | 3 days supply | Qty: 12 | Fill #0

## 2019-07-18 MED FILL — LOSARTAN POTASSIUM 100 MG T: 100 | 90 days supply | Qty: 90 | Fill #1

## 2019-09-09 DIAGNOSIS — S83281A Other tear of lateral meniscus, current injury, right knee, initial encounter: Secondary | ICD-10-CM | POA: Diagnosis not present

## 2019-09-09 DIAGNOSIS — M25562 Pain in left knee: Secondary | ICD-10-CM | POA: Diagnosis not present

## 2019-09-23 MED FILL — HYDROCHLOROTHIAZIDE 12.5 MG: 12.5 | 90 days supply | Qty: 90 | Fill #1

## 2019-09-25 MED FILL — SILDENAFIL CITRATE 20 MG TA: 20 | 30 days supply | Qty: 50 | Fill #1

## 2019-10-22 MED FILL — LOSARTAN POTASSIUM 100 MG T: 100 | 90 days supply | Qty: 90 | Fill #2

## 2019-10-25 DIAGNOSIS — G4733 Obstructive sleep apnea (adult) (pediatric): Secondary | ICD-10-CM | POA: Diagnosis not present

## 2019-11-06 ENCOUNTER — Telehealth: Payer: 59 | Admitting: Family

## 2019-11-06 DIAGNOSIS — J069 Acute upper respiratory infection, unspecified: Secondary | ICD-10-CM | POA: Diagnosis not present

## 2019-11-06 MED ORDER — BENZONATATE 100 MG PO CAPS: 100.0000 mg | ORAL_CAPSULE | Freq: Three times a day (TID) | ORAL | 0 refills | Status: DC | PRN

## 2019-11-06 NOTE — Progress Notes (Signed)
E-Visit for Corona Virus Screening  Your current symptoms could be consistent with the coronavirus.  Many health care providers can now test patients at their office but not all are.  [place] has multiple testing sites. For information on our [place] testing locations and hours go to [email or web address]  We are enrolling you in our [place] for [place] . Daily you will receive a questionnaire within the [facility] website. Our COVID 19 response team will be monitoring your responses daily.  Testing Information: The COVID-19 Community Testing sites are testing BY APPOINTMENT ONLY.  You can schedule online at [email or web address]  If you do not have access to a smart phone or computer you may call [phone] for an appointment.   Additional testing sites in the Community:  . For [facility] Testing sites in [facility]  [email or web address]  . For Pop-up testing sites in [place]  BowlDirectory.[email or web address]  . For [facility] [email or web address]  . For [facility] testing in [place] and [employer] [email or web address]  . For [facility] testing in [facility]   [email or web address]  For  more information about community testing call [phone]   Please quarantine yourself while awaiting your test results. Please stay home for a minimum of 10 days from the first day of illness with improving symptoms and you have had 24 hours of no fever (without the use of Tylenol (Acetaminophen) Motrin (Ibuprofen) or any fever reducing medication).  Also - Do not get tested prior to returning to work because once you have had a positive test the test can stay  positive for more than a month in some cases.   You should wear a mask or cloth face covering over your nose and mouth if you must be around other people or animals, including pets (even at home). Try to stay at least 6 feet away from other people. This will protect the people around you.  Please continue good preventive care measures, including:  frequent hand-washing, avoid touching your face, cover coughs/sneezes, stay out of crowds and keep a 6 foot distance from others.  COVID-19 is a respiratory illness with symptoms that are similar to the flu. Symptoms are typically mild to moderate, but there have been cases of severe illness and death due to the virus.   The following symptoms may appear 2-14 days after exposure: . Fever . Cough . Shortness of breath or difficulty breathing . Chills . Repeated shaking with chills . Muscle pain . Headache . Sore throat . New loss of taste or smell . Fatigue . Congestion or runny nose . Nausea or vomiting . Diarrhea  Go to the nearest hospital ED for assessment if fever/cough/breathlessness are severe or illness seems like a threat to life.  It is vitally important that if you feel that you have an infection such as this virus or any other virus that you stay home and away from places where you may spread it to others.  You should avoid contact with people age 9 and older.   You can use medication such as A prescription cough medication called Tessalon Perles 100 mg. You may take 1-2 capsules every 8 hours as needed for cough  You may also take acetaminophen (Tylenol) as needed for fever.  Reduce your risk of any infection by using the same precautions used for avoiding the common cold or flu:  Marland Kitchen Wash your hands often with soap and warm water for at least 20 seconds.  If soap and water are not  readily available, use an alcohol-based hand sanitizer with at least 60% alcohol.  . If coughing or sneezing, cover your mouth and nose by coughing or  sneezing into the elbow areas of your shirt or coat, into a tissue or into your sleeve (not your hands). . Avoid shaking hands with others and consider head nods or verbal greetings only. . Avoid touching your eyes, nose, or mouth with unwashed hands.  . Avoid close contact with people who are sick. . Avoid places or events with large numbers of people in one location, like concerts or sporting events. . Carefully consider travel plans you have or are making. . If you are planning any travel outside or inside the [place], visit the CDC's Travelers' Health webpage for the latest health notices. . If you have some symptoms but not all symptoms, continue to monitor at home and seek medical attention if your symptoms worsen. . If you are having a medical emergency, call 911.  HOME CARE . Only take medications as instructed by your medical team. . Drink plenty of fluids and get plenty of rest. . A steam or ultrasonic humidifier can help if you have congestion.   GET HELP RIGHT AWAY IF YOU HAVE EMERGENCY WARNING SIGNS** FOR COVID-19. If you or someone is showing any of these signs seek emergency medical care immediately. Call 911 or proceed to your closest emergency facility if: . You develop worsening high fever. . Trouble breathing . Bluish lips or face . Persistent pain or pressure in the chest . New confusion . Inability to wake or stay awake . You cough up blood. . Your symptoms become more severe  **This list is not all possible symptoms. Contact your medical provider for any symptoms that are sever or concerning to you.  MAKE SURE YOU   Understand these instructions.  Will watch your condition.  Will get help right away if you are not doing well or get worse.  Your e-visit answers were reviewed by a board certified advanced clinical practitioner to complete your personal care plan.  Depending on the condition, your plan could have included both over the counter or prescription  medications.  If there is a problem please reply once you have received a response from your provider.  Your safety is important to us.  If you have drug allergies check your prescription carefully.    You can use MyChart to ask questions about today's visit, request a non-urgent call back, or ask for a work or school excuse for 24 hours related to this e-Visit. If it has been greater than 24 hours you will need to follow up with your provider, or enter a new e-Visit to address those concerns. You will get an e-mail in the next two days asking about your experience.  I hope that your e-visit has been valuable and will speed your recovery. Thank you for using e-visits.   Approximately 5 minutes was spent documenting and reviewing patient's chart.   

## 2019-11-07 DIAGNOSIS — L814 Other melanin hyperpigmentation: Secondary | ICD-10-CM | POA: Diagnosis not present

## 2019-11-07 DIAGNOSIS — L821 Other seborrheic keratosis: Secondary | ICD-10-CM | POA: Diagnosis not present

## 2019-11-07 DIAGNOSIS — L570 Actinic keratosis: Secondary | ICD-10-CM | POA: Diagnosis not present

## 2019-11-07 DIAGNOSIS — D225 Melanocytic nevi of trunk: Secondary | ICD-10-CM | POA: Diagnosis not present

## 2019-11-07 MED FILL — BENZONATATE 100 MG CAPS: 100 | 7 days supply | Qty: 20 | Fill #0

## 2019-11-11 MED FILL — SILDENAFIL CITRATE 20 MG TA: 20 | 30 days supply | Qty: 50 | Fill #2

## 2019-12-20 MED FILL — HYDROCHLOROTHIAZIDE 12.5 MG: 12.5 | 90 days supply | Qty: 90 | Fill #2

## 2020-01-01 MED FILL — HYDROCHLOROTHIAZIDE 12.5 MG: 12.5 | 90 days supply | Qty: 90 | Fill #2

## 2020-01-13 MED FILL — LOSARTAN POTASSIUM 100 MG T: 100 | 90 days supply | Qty: 90 | Fill #3

## 2020-01-16 MED FILL — SHINGRIX 50 MCG SUS: 50 | 1 days supply | Qty: 1 | Fill #0

## 2020-01-17 MED FILL — SILDENAFIL CITRATE 20 MG TA: 20 | 30 days supply | Qty: 50 | Fill #3

## 2020-01-23 ENCOUNTER — Other Ambulatory Visit (HOSPITAL_COMMUNITY): Payer: Self-pay | Admitting: Physician Assistant

## 2020-01-23 DIAGNOSIS — S46011A Strain of muscle(s) and tendon(s) of the rotator cuff of right shoulder, initial encounter: Secondary | ICD-10-CM | POA: Diagnosis not present

## 2020-01-23 DIAGNOSIS — M25551 Pain in right hip: Secondary | ICD-10-CM | POA: Diagnosis not present

## 2020-01-23 DIAGNOSIS — M545 Low back pain, unspecified: Secondary | ICD-10-CM | POA: Diagnosis not present

## 2020-01-23 DIAGNOSIS — M7711 Lateral epicondylitis, right elbow: Secondary | ICD-10-CM | POA: Diagnosis not present

## 2020-01-23 MED FILL — CELECOXIB 200 MG CAPSULE: 200 | 30 days supply | Qty: 30 | Fill #0

## 2020-01-27 DIAGNOSIS — H16292 Other keratoconjunctivitis, left eye: Secondary | ICD-10-CM | POA: Diagnosis not present

## 2020-01-27 DIAGNOSIS — M25511 Pain in right shoulder: Secondary | ICD-10-CM | POA: Diagnosis not present

## 2020-01-29 ENCOUNTER — Other Ambulatory Visit: Payer: Self-pay | Admitting: Physician Assistant

## 2020-01-29 DIAGNOSIS — M5441 Lumbago with sciatica, right side: Secondary | ICD-10-CM

## 2020-01-29 DIAGNOSIS — M75111 Incomplete rotator cuff tear or rupture of right shoulder, not specified as traumatic: Secondary | ICD-10-CM

## 2020-01-29 DIAGNOSIS — M19011 Primary osteoarthritis, right shoulder: Secondary | ICD-10-CM

## 2020-01-29 DIAGNOSIS — G8929 Other chronic pain: Secondary | ICD-10-CM

## 2020-02-17 ENCOUNTER — Encounter: Payer: Self-pay | Admitting: Physical Therapy

## 2020-02-17 ENCOUNTER — Other Ambulatory Visit: Payer: Self-pay

## 2020-02-17 ENCOUNTER — Ambulatory Visit: Payer: 59 | Attending: Physician Assistant | Admitting: Physical Therapy

## 2020-02-17 DIAGNOSIS — M25511 Pain in right shoulder: Secondary | ICD-10-CM | POA: Diagnosis not present

## 2020-02-17 DIAGNOSIS — M6281 Muscle weakness (generalized): Secondary | ICD-10-CM | POA: Insufficient documentation

## 2020-02-17 DIAGNOSIS — M545 Low back pain, unspecified: Secondary | ICD-10-CM | POA: Insufficient documentation

## 2020-02-17 DIAGNOSIS — G8929 Other chronic pain: Secondary | ICD-10-CM | POA: Insufficient documentation

## 2020-02-17 NOTE — Patient Instructions (Signed)
Access Code: [email or web address] URL: [email or web address] Date: 02/17/2020 Prepared by: Rosana Hoes  Exercises Shoulder External Rotation with Anchored Resistance - 1-2 x daily - 7 x weekly - 2 sets - 15 reps Shoulder Internal Rotation with Resistance - 1-2 x daily - 7 x weekly - 2 sets - 15 reps Supine Lower Trunk Rotation - 1-2 x daily - 7 x weekly - 5 reps - 5-10 seconds hold Supine Piriformis Stretch with Foot on Ground - 1-2 x daily - 7 x weekly - 3 reps - 30 seconds hold Supine Bridge - 1 x daily - 7 x weekly - 2 sets - 10 reps - 1-2 seconds hold Thomas Stretch on Table - 1-2 x daily - 7 x weekly - 3 reps - 30 seconds hold

## 2020-02-17 NOTE — Therapy (Signed)
[place], [place], [address] Phone: [phone]   Fax:  [phone]  Physical Therapy Evaluation  Patient Details  Name: Adam Avila MRN: [id number] Date of Birth: Jun 22, 1960 Referring Provider (PT): Matthew Saras, [place]   Encounter Date: 02/17/2020   PT End of Session - 02/17/20 1539    Visit Number 1    Number of Visits 8    Date for PT Re-Evaluation 04/13/20    Authorization Type [facility]    PT Start Time [id number]    PT Stop Time 1445    PT Time Calculation (min) 40 min    Activity Tolerance Patient tolerated treatment well    Behavior During Therapy [facility] for tasks assessed/performed           Past Medical History:  Diagnosis Date  . Arthritis    "back" (12/29/2014)  . DDD (degenerative disc disease), lumbar   . HNP (herniated nucleus pulposus), lumbar    l4-5  . Hypertension   . Kidney stones   . Primary localized osteoarthritis of left knee 12/29/2014  . Snoring     Past Surgical History:  Procedure Laterality Date  . ADENOIDECTOMY  ~ 1970   "not tonsils"  . COLONOSCOPY    . CYSTOSCOPY W/ STONE MANIPULATION  ~ 2002  . LASIK Bilateral 2008  . LITHOTRIPSY  X 1  . TOTAL KNEE ARTHROPLASTY Left 12/29/2014  . TOTAL KNEE ARTHROPLASTY Left 12/29/2014   Procedure: LEFT TOTAL KNEE ARTHROPLASTY;  Surgeon: Elsie Saas, MD;  Location: [place];  Service: Orthopedics;  Laterality: Left;    There were no vitals filed for this visit.    Subjective Assessment - 02/17/20 1407    Subjective Patient reports right shoulder pain, had an MRI and has partial rotator cuff tear. He started noticing it a lot when cutting down trees with pole saw and felt aching. He has been feeling pain for the past 2-3 months, especialy when raising arm out to the side or back. Also notes ongoing back pain for years. States that flexibility has become less and less and he hasn't been doing much core strengthening. Also has  right groin/hip pain with activity.    Limitations Lifting;House hold activities;Sitting;Standing    How long can you sit comfortably? 30 minutes    How long can you stand comfortably? 30 minutes    Diagnostic tests MRI    Patient Stated Goals Be able to exercise and active with less pain    Currently in Pain? Yes    Pain Score 2     Pain Location Shoulder    Pain Orientation Right    Pain Descriptors / Indicators Aching    Pain Type Chronic pain    Pain Onset More than a month ago    Pain Frequency Intermittent    Aggravating Factors  Raising arm up especially to the side, nocturnal pain    Pain Relieving Factors Rest    Multiple Pain Sites Yes    Pain Score 2    Pain Location Back    Pain Orientation Lower;Right    Pain Descriptors / Indicators Aching;Tightness    Pain Radiating Towards can radiate into right hip    Pain Onset More than a month ago    Pain Frequency Intermittent    Aggravating Factors  Sitting or standing extended periods    Pain Relieving Factors Rest              [facility] PT  Assessment - 02/17/20 0001      Assessment   Medical Diagnosis Chronic lower back and right shoulder pain    Referring Provider (PT) Shepperson, Kirstin, PA-C    Onset Date/Surgical Date --   Lower back ongoing for years, right shoulder ongoing 3-4 months   Hand Dominance Right    Next MD Visit Not scheduled    Prior Therapy Yes - left TKA      Precautions   Precautions None      Restrictions   Weight Bearing Restrictions No      Balance Screen   Has the patient fallen in the past 6 months No    Has the patient had a decrease in activity level because of a fear of falling?  No    Is the patient reluctant to leave their home because of a fear of falling?  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    Leisure Exercise      Cognition   Overall Cognitive Status Within Functional Limits  for tasks assessed      Observation/Other Assessments   Observations Patient appears in no apparent distress    Focus on Therapeutic Outcomes (FOTO)  Lumbar: 59% functional status, Shoulder: 62% functional status      Sensation   Light Touch Appears Intact      Coordination   Gross Motor Movements are Fluid and Coordinated Yes      Posture/Postural Control   Posture Comments Rounded shoulder postural, decreased lumbar lordosis      ROM / Strength   AROM / PROM / Strength AROM;Strength;PROM      AROM   AROM Assessment Site Shoulder;Lumbar    Right/Left Shoulder Right;Left    Right Shoulder Flexion 160 Degrees   pain at end range   Right Shoulder ABduction 170 Degrees   pain at end range   Right Shoulder Internal Rotation --   reach to T12   Right Shoulder External Rotation 45 Degrees    Left Shoulder Flexion 170 Degrees    Left Shoulder ABduction 170 Degrees    Left Shoulder Internal Rotation --   reach to T8   Left Shoulder External Rotation 60 Degrees    Lumbar Flexion 75%    Lumbar Extension 75%    Lumbar - Right Side Bend 75%    Lumbar - Left Side Bend 75%    Lumbar - Right Rotation 75%    Lumbar - Left Rotation 75%      PROM   Overall PROM Comments Hip IR and flexion limited with report of groin pain, worse on right      Strength   Strength Assessment Site Shoulder;Hip    Right/Left Shoulder Right;Left    Right Shoulder Flexion 4/5    Right Shoulder ABduction 4/5    Right Shoulder Internal Rotation 5/5    Right Shoulder External Rotation 4/5    Left Shoulder Flexion 5/5    Left Shoulder ABduction 5/5    Left Shoulder Internal Rotation 5/5    Left Shoulder External Rotation 5/5    Right/Left Hip Right;Left    Right Hip Flexion 4/5    Right Hip Extension 4-/5    Right Hip ABduction 4-/5    Left Hip Flexion 4/5    Left Hip Extension 4-/5    Left Hip ABduction 4-/5      Flexibility  Soft Tissue Assessment /Muscle Length yes    Hamstrings Grossly limited  bilaterally    Quadriceps Grossly limited bilaterally    Piriformis Grossly limited bilaterally      Palpation   Spinal mobility No concordant pain reported with CPAs    Palpation comment TTP right greater trochanteric region      Special Tests   Other special tests Radicular testing negative for lumbar      Transfers   Transfers Independent with all Transfers                      Objective measurements completed on examination: See above findings.       [facility] Adult PT Treatment/Exercise - 02/17/20 0001      Exercises   Exercises Lumbar;Shoulder      Lumbar Exercises: Stretches   Lower Trunk Rotation 5 reps;10 seconds    Hip Flexor Stretch 2 reps;30 seconds    Hip Flexor Stretch Limitations thomas stretch edge of bed    Piriformis Stretch 2 reps;30 seconds    Piriformis Stretch Limitations supine      Lumbar Exercises: Supine   Bridge 10 reps;3 seconds      Shoulder Exercises: Standing   External Rotation 10 reps    Theraband Level (Shoulder External Rotation) Level 2 (Red)    Internal Rotation 10 reps    Theraband Level (Shoulder Internal Rotation) Level 2 (Red)                  PT Education - 02/17/20 1419    Education Details Exam findings, POC, HEP    Person(s) Educated Patient    Methods Explanation;Demonstration;Tactile cues;Verbal cues;Handout    Comprehension Verbalized understanding;Returned demonstration;Verbal cues required;Tactile cues required;Need further instruction            PT Short Term Goals - 02/17/20 1555      PT SHORT TERM GOAL #1   Title Patient will be I with initial HEP to progress with PT    Time 4    Period Weeks    Status New    Target Date 03/16/20      PT SHORT TERM GOAL #2   Title PT will review FOTO with patient by 3rd visit    Time 3    Period Weeks    Status New    Target Date 03/09/20      PT SHORT TERM GOAL #3   Title Patient will exhibit right shoulder elevation equal to opposite side with  </= 2/10 pain level    Time 4    Period Weeks    Status New    Target Date 03/16/20             PT Long Term Goals - 02/17/20 1557      PT LONG TERM GOAL #1   Title Patient will be I with final HEP to maintain progress from PT    Time 8    Period Weeks    Status New    Target Date 04/13/20      PT LONG TERM GOAL #2   Title Patient will report improved functional status >/= 71% shoulder and >/= 59% lumbar on FOTO    Time 8    Period Weeks    Status New    Target Date 04/13/20      PT LONG TERM GOAL #3   Title Patient will demonstrate >/= 4+/5 MMT right shoulder strength to improve lifting overhead  Time 8    Period Weeks    Status New    Target Date 04/13/20      PT LONG TERM GOAL #4   Title Patient will demonstrate >/= 4/5 MMT of gross core/hips to improve standing tolerance and job related tasks    Time 8    Period Weeks    Status New    Target Date 04/13/20      PT LONG TERM GOAL #5   Title Patient will be report </= 1/10 pain at rest for low back and shoulder, with </= 3/10 pain with all activity    Time 8    Period Weeks    Status New    Target Date 04/13/20                  Plan - 02/17/20 1540    Clinical Impression Statement Patient presents to PT with report of chronic right shoulder and lower back pain that limit activity. Patient does have MRI confirmed partial tear of right rotator cuff and his symptoms seem consistent with subacromial pain. He does exhibit slight limitations in active motion and rotator cuff weakness. Patient also with chronic non-specific low back pain with core and hip weakness and mobility deficits of both hips. His right groin pain seems to be more hip related pathology rather than radicular pain. He was provided exercises to initiate strengthening for rotator cuff and core/hips, and stretching for hip and lumbar region. Patient would benefit from continued skilled PT to progress his mobility and strength in order to reduce  pain and maximize functional ability.    Personal Factors and Comorbidities Fitness;Past/Current Experience;Social Background    Examination-Activity Limitations Reach Overhead;Sit;Sleep;Stand    Examination-Participation Restrictions Occupation;Community Activity;Shop;Yard Work    Stability/Clinical Decision Making Stable/Uncomplicated    Clinical Decision Making Low    Rehab Potential Good    PT Frequency 1x / week    PT Duration 8 weeks    PT Treatment/Interventions ADLs/Self Care Home Management;Cryotherapy;Electrical Stimulation;Iontophoresis 4mg /ml Dexamethasone;Moist Heat;Traction;Ultrasound;Neuromuscular re-education;Balance training;Therapeutic exercise;Therapeutic activities;Functional mobility training;Stair training;Gait training;Patient/family education;Manual techniques;Dry needling;Passive range of motion;Taping;Spinal Manipulations;Joint Manipulations    PT Next Visit Plan Review HEP and progress PRN, manual for right shoulder mobility, rotator cuff and postural strengthening, manual for hip and lumbar mobility, progress core and hip strengthening    PT Home Exercise Plan WEXFLVHN: banded shoulder ER/IR with red, LTR, piriformis stretch, thomas hip flexor stretch, bridge    Consulted and Agree with Plan of Care Patient           Patient will benefit from skilled therapeutic intervention in order to improve the following deficits and impairments:  Decreased range of motion,Postural dysfunction,Decreased strength,Pain,Decreased activity tolerance,Impaired flexibility  Visit Diagnosis: Chronic right shoulder pain  Chronic bilateral low back pain, unspecified whether sciatica present  Muscle weakness (generalized)     Problem List Patient Active Problem List   Diagnosis Date Noted  . Primary localized osteoarthritis of left knee 12/29/2014  . DJD (degenerative joint disease) of knee 12/29/2014  . Tear of lateral meniscus of left knee 12/05/2013  . Primary  osteoarthritis of left knee 11/07/2013  . Right-sided low back pain with right-sided sciatica 11/07/2013  . MUSCLE STRAIN, LEFT CALF 10/07/2009  . HERNIATED LUMBAR DISK WITH RADICULOPATHY 08/26/2008  . ADJ DISORDER WITH MIXED ANXIETY & DEPRESSED MOOD 07/30/2008    08/01/2008, PT, DPT, LAT, ATC 02/17/20  4:25 PM Phone: [phone] Fax: [phone]   [facility] [phone]  [place], [place], [address] Phone: [phone]   Fax:  [phone]  Name: IMERE CIARROCCHI MRN: [id number] Date of Birth: February 02, 1961

## 2020-02-21 MED FILL — CELECOXIB 200 MG CAP: 200 | 30 days supply | Qty: 30 | Fill #1

## 2020-02-25 ENCOUNTER — Encounter: Payer: Self-pay | Admitting: Physical Therapy

## 2020-02-25 ENCOUNTER — Ambulatory Visit: Payer: 59 | Admitting: Physical Therapy

## 2020-02-25 ENCOUNTER — Other Ambulatory Visit: Payer: Self-pay

## 2020-02-25 DIAGNOSIS — G8929 Other chronic pain: Secondary | ICD-10-CM | POA: Diagnosis not present

## 2020-02-25 DIAGNOSIS — M545 Low back pain, unspecified: Secondary | ICD-10-CM

## 2020-02-25 DIAGNOSIS — M6281 Muscle weakness (generalized): Secondary | ICD-10-CM

## 2020-02-25 DIAGNOSIS — M25511 Pain in right shoulder: Secondary | ICD-10-CM | POA: Diagnosis not present

## 2020-02-25 NOTE — Therapy (Signed)
[place], [place], [address] Phone: [phone]   Fax:  [phone]  Physical Therapy Treatment  Patient Details  Name: Adam Avila MRN: [id number] Date of Birth: 23-Jul-1960 Referring Provider (PT): Matthew Saras, [place]   Encounter Date: 02/25/2020   PT End of Session - 02/25/20 2101    Visit Number 2    Number of Visits 8    Date for PT Re-Evaluation 04/13/20    Authorization Type [facility]    PT Start Time 1634    PT Stop Time 1717    PT Time Calculation (min) 43 min    Activity Tolerance Patient tolerated treatment well    Behavior During Therapy [facility] for tasks assessed/performed           Past Medical History:  Diagnosis Date  . Arthritis    "back" (12/29/2014)  . DDD (degenerative disc disease), lumbar   . HNP (herniated nucleus pulposus), lumbar    l4-5  . Hypertension   . Kidney stones   . Primary localized osteoarthritis of left knee 12/29/2014  . Snoring     Past Surgical History:  Procedure Laterality Date  . ADENOIDECTOMY  ~ 1970   "not tonsils"  . COLONOSCOPY    . CYSTOSCOPY W/ STONE MANIPULATION  ~ 2002  . LASIK Bilateral 2008  . LITHOTRIPSY  X 1  . TOTAL KNEE ARTHROPLASTY Left 12/29/2014  . TOTAL KNEE ARTHROPLASTY Left 12/29/2014   Procedure: LEFT TOTAL KNEE ARTHROPLASTY;  Surgeon: Elsie Saas, MD;  Location: [place];  Service: Orthopedics;  Laterality: Left;    There were no vitals filed for this visit.   Subjective Assessment - 02/25/20 2053    Subjective Right shoulder pain 1-2/10. Pt. performing HEP from eval and has tolerated well. LBP symptoms have been at lateral lumbar region which had been on left but more on right today. He notes benefit in from hip flexor stretches given with HEP.    Currently in Pain? Yes    Pain Score --   1-2/10   Pain Location Shoulder    Pain Orientation Right    Pain Descriptors / Indicators Aching    Pain Type Chronic pain     Pain Onset More than a month ago    Pain Frequency Intermittent    Aggravating Factors  raising arm from side, nocturnal pain    Pain Relieving Factors rest                             [facility] Adult PT Treatment/Exercise - 02/25/20 0001      Exercises   Exercises Lumbar;Shoulder      Lumbar Exercises: Stretches   Passive Hamstring Stretch Right;Left;2 reps;30 seconds      Lumbar Exercises: Aerobic   Elliptical L5ramp 10 x 5 min      Lumbar Exercises: Supine   Bent Knee Raise 10 reps    Bridge 10 reps      Shoulder Exercises: Supine   Horizontal ABduction AROM;Strengthening;Both;20 reps    Theraband Level (Shoulder Horizontal ABduction) Level 3 (Green)      Shoulder Exercises: Sidelying   ABduction AROM;Strengthening;Right;20 reps    ABduction Weight (lbs) 2    ABduction Limitations plane of scaption      Shoulder Exercises: Standing   External Rotation AROM;Strengthening;Both;20 reps    Theraband Level (Shoulder External Rotation) Level 2 (Red)    Internal Rotation AROM;Strengthening;Both;20 reps  Theraband Level (Shoulder Internal Rotation) Level 2 (Red)    Extension AROM;Strengthening;Both;20 reps    Theraband Level (Shoulder Extension) Level 3 (Green)    Row AROM;Strengthening;Both;20 reps    Theraband Level (Shoulder Row) Level 3 (Green)    Other Standing Exercises wall push up with a plus 2x10, "Cressy press" 2x10      Manual Therapy   Manual Therapy Soft tissue mobilization;Joint mobilization    Joint Mobilization Right GH mobilization AP grade I-III    Soft tissue mobilization Trigger point release right infraspinatus, STM lumbar paraspinals                  PT Education - 02/25/20 2100    Education Details exercises    Person(s) Educated Patient    Methods Explanation;Demonstration;Verbal cues    Comprehension Returned demonstration;Verbalized understanding            PT Short Term Goals - 02/17/20 1555      PT SHORT TERM  GOAL #1   Title Patient will be I with initial HEP to progress with PT    Time 4    Period Weeks    Status New    Target Date 03/16/20      PT SHORT TERM GOAL #2   Title PT will review FOTO with patient by 3rd visit    Time 3    Period Weeks    Status New    Target Date 03/09/20      PT SHORT TERM GOAL #3   Title Patient will exhibit right shoulder elevation equal to opposite side with </= 2/10 pain level    Time 4    Period Weeks    Status New    Target Date 03/16/20             PT Long Term Goals - 02/17/20 1557      PT LONG TERM GOAL #1   Title Patient will be I with final HEP to maintain progress from PT    Time 8    Period Weeks    Status New    Target Date 04/13/20      PT LONG TERM GOAL #2   Title Patient will report improved functional status >/= 71% shoulder and >/= 59% lumbar on FOTO    Time 8    Period Weeks    Status New    Target Date 04/13/20      PT LONG TERM GOAL #3   Title Patient will demonstrate >/= 4+/5 MMT right shoulder strength to improve lifting overhead    Time 8    Period Weeks    Status New    Target Date 04/13/20      PT LONG TERM GOAL #4   Title Patient will demonstrate >/= 4/5 MMT of gross core/hips to improve standing tolerance and job related tasks    Time 8    Period Weeks    Status New    Target Date 04/13/20      PT LONG TERM GOAL #5   Title Patient will be report </= 1/10 pain at rest for low back and shoulder, with </= 3/10 pain with all activity    Time 8    Period Weeks    Status New    Target Date 04/13/20                 Plan - 02/25/20 2101    Clinical Impression Statement Tx. focus progression of right shoulder rotator cuff  and periscapular strengthening with good tolerance. Given symptom etiology expect progress for this will be gradual but so far tolerating exercises well. Brief lumbar exercises otherwise focus for lumbar region of STM. Given also chronic symptoms expect progress for this will be  gradual as well.    Personal Factors and Comorbidities Fitness;Past/Current Experience;Social Background    Examination-Activity Limitations Reach Overhead;Sit;Sleep;Stand    Examination-Participation Restrictions Occupation;Community Activity;Shop;Yard Work    Stability/Clinical Decision Making Stable/Uncomplicated    Clinical Decision Making Low    Rehab Potential Good    PT Frequency 1x / week    PT Duration 8 weeks    PT Treatment/Interventions ADLs/Self Care Home Management;Cryotherapy;Electrical Stimulation;Iontophoresis 4mg /ml Dexamethasone;Moist Heat;Traction;Ultrasound;Neuromuscular re-education;Balance training;Therapeutic exercise;Therapeutic activities;Functional mobility training;Stair training;Gait training;Patient/family education;Manual techniques;Dry needling;Passive range of motion;Taping;Spinal Manipulations;Joint Manipulations    PT Next Visit Plan manual for right shoulder mobility, rotator cuff and postural strengthening, manual for hip and lumbar mobility, progress core and hip strengthening, update HEP for further exercises as tolerated    PT Home Exercise Plan WEXFLVHN: banded shoulder ER/IR with red, LTR, piriformis stretch, thomas hip flexor stretch, bridge    Consulted and Agree with Plan of Care Patient           Patient will benefit from skilled therapeutic intervention in order to improve the following deficits and impairments:  Decreased range of motion,Postural dysfunction,Decreased strength,Pain,Decreased activity tolerance,Impaired flexibility  Visit Diagnosis: Chronic right shoulder pain  Chronic bilateral low back pain, unspecified whether sciatica present  Muscle weakness (generalized)     Problem List Patient Active Problem List   Diagnosis Date Noted  . Primary localized osteoarthritis of left knee 12/29/2014  . DJD (degenerative joint disease) of knee 12/29/2014  . Tear of lateral meniscus of left knee 12/05/2013  . Primary osteoarthritis  of left knee 11/07/2013  . Right-sided low back pain with right-sided sciatica 11/07/2013  . MUSCLE STRAIN, LEFT CALF 10/07/2009  . HERNIATED LUMBAR DISK WITH RADICULOPATHY 08/26/2008  . ADJ DISORDER WITH MIXED ANXIETY & DEPRESSED MOOD 07/30/2008   Beaulah Dinning, PT, DPT 02/25/20 9:06 PM  [facility] [address] [place], [place], [address] Phone: [phone]   Fax:  [phone]  Name: HOBY KAWAI MRN: [id number] Date of Birth: 1961/02/03

## 2020-03-05 ENCOUNTER — Ambulatory Visit: Payer: 59 | Admitting: Physical Therapy

## 2020-03-05 ENCOUNTER — Other Ambulatory Visit: Payer: Self-pay

## 2020-03-05 DIAGNOSIS — M6281 Muscle weakness (generalized): Secondary | ICD-10-CM | POA: Diagnosis not present

## 2020-03-05 DIAGNOSIS — M545 Low back pain, unspecified: Secondary | ICD-10-CM | POA: Diagnosis not present

## 2020-03-05 DIAGNOSIS — G8929 Other chronic pain: Secondary | ICD-10-CM

## 2020-03-05 DIAGNOSIS — M25511 Pain in right shoulder: Secondary | ICD-10-CM | POA: Diagnosis not present

## 2020-03-05 NOTE — Patient Instructions (Signed)

## 2020-03-05 NOTE — Therapy (Signed)
[place], [place], [address] Phone: [phone]   Fax:  [phone]  Physical Therapy Treatment  Patient Details  Name: Adam Avila MRN: [id number] Date of Birth: Jun 29, 1960 Referring Provider (PT): Matthew Saras, [place]   Encounter Date: 03/05/2020   PT End of Session - 03/05/20 1555    Visit Number 3    Number of Visits 8    Date for PT Re-Evaluation 04/13/20    Authorization Type [facility]    PT Start Time 0086   arrived a few minutes late   PT Stop Time 1632   6 min spent dry needling not included in timed minutes   PT Time Calculation (min) 38 min    Activity Tolerance Patient tolerated treatment well    Behavior During Therapy [facility] for tasks assessed/performed           Past Medical History:  Diagnosis Date  . Arthritis    "back" (12/29/2014)  . DDD (degenerative disc disease), lumbar   . HNP (herniated nucleus pulposus), lumbar    l4-5  . Hypertension   . Kidney stones   . Primary localized osteoarthritis of left knee 12/29/2014  . Snoring     Past Surgical History:  Procedure Laterality Date  . ADENOIDECTOMY  ~ 1970   "not tonsils"  . COLONOSCOPY    . CYSTOSCOPY W/ STONE MANIPULATION  ~ 2002  . LASIK Bilateral 2008  . LITHOTRIPSY  X 1  . TOTAL KNEE ARTHROPLASTY Left 12/29/2014  . TOTAL KNEE ARTHROPLASTY Left 12/29/2014   Procedure: LEFT TOTAL KNEE ARTHROPLASTY;  Surgeon: Elsie Saas, MD;  Location: [place];  Service: Orthopedics;  Laterality: Left;    There were no vitals filed for this visit.   Subjective Assessment - 03/05/20 1555    Subjective Was feeling good but "tweaked" low back after lifting bags of ice with recent winter weather however since easing. "0-1"/10 pain today for shoulder and back. Pt. performing shoulder Theraband exercise from HEP so in agreement for lumbar focus with HEP updates for both regions.                             [place]  Adult PT Treatment/Exercise - 03/05/20 0001      Lumbar Exercises: Stretches   Other Lumbar Stretch Exercise child's pose 20 sec x 3      Lumbar Exercises: Aerobic   Nustep L6 x 5 min UE/LE      Lumbar Exercises: Supine   Pelvic Tilt 10 reps   practice before dead bug attempt   Dead Bug 15 reps      Lumbar Exercises: Quadruped   Madcat/Old Horse 15 reps    Opposite Arm/Leg Raise 10 reps      Shoulder Exercises: Standing   Other Standing Exercises handout review-shoulder HEP updates      Manual Therapy   Joint Mobilization Lumbar CPAs grade I-II L2-5, sidelying lumbar manipulation bilaterally    Soft tissue mobilization STM right lower lumbar paraspinals            Trigger Point Dry Needling - 03/05/20 0001    Consent Given? Yes    Education Handout Provided Yes    Muscles Treated Back/Hip Lumbar multifidi;Erector spinae   right side at L3-5 region               PT Education - 03/05/20 2136    Education Details dry needling,  HEP updates, spinal manipulation    Person(s) Educated Patient    Methods Explanation;Handout    Comprehension Verbalized understanding;Returned demonstration            PT Short Term Goals - 02/17/20 1555      PT SHORT TERM GOAL #1   Title Patient will be I with initial HEP to progress with PT    Time 4    Period Weeks    Status New    Target Date 03/16/20      PT SHORT TERM GOAL #2   Title PT will review FOTO with patient by 3rd visit    Time 3    Period Weeks    Status New    Target Date 03/09/20      PT SHORT TERM GOAL #3   Title Patient will exhibit right shoulder elevation equal to opposite side with </= 2/10 pain level    Time 4    Period Weeks    Status New    Target Date 03/16/20             PT Long Term Goals - 02/17/20 1557      PT LONG TERM GOAL #1   Title Patient will be I with final HEP to maintain progress from PT    Time 8    Period Weeks    Status New    Target Date 04/13/20      PT LONG TERM  GOAL #2   Title Patient will report improved functional status >/= 71% shoulder and >/= 59% lumbar on FOTO    Time 8    Period Weeks    Status New    Target Date 04/13/20      PT LONG TERM GOAL #3   Title Patient will demonstrate >/= 4+/5 MMT right shoulder strength to improve lifting overhead    Time 8    Period Weeks    Status New    Target Date 04/13/20      PT LONG TERM GOAL #4   Title Patient will demonstrate >/= 4/5 MMT of gross core/hips to improve standing tolerance and job related tasks    Time 8    Period Weeks    Status New    Target Date 04/13/20      PT LONG TERM GOAL #5   Title Patient will be report </= 1/10 pain at rest for low back and shoulder, with </= 3/10 pain with all activity    Time 8    Period Weeks    Status New    Target Date 04/13/20                 Plan - 03/05/20 2138    Clinical Impression Statement Updated shoulder HEP (also lumbar HEP) otherwise tx. focus today lumbar region with lumbar/core strengthening progression, manual therapy with spinal mobs/manipulation and trial dry needling to lumbar paraspinals. Session well-tolerated/no symptom exacerbation with tx. and so far progressing appropriatey with therapy toward STGs/LTGs.    Personal Factors and Comorbidities Fitness;Past/Current Experience;Social Background    Examination-Activity Limitations Reach Overhead;Sit;Sleep;Stand    Examination-Participation Restrictions Occupation;Community Activity;Shop;Yard Work    Stability/Clinical Decision Making Stable/Uncomplicated    Clinical Decision Making Low    Rehab Potential Good    PT Frequency 1x / week    PT Duration 8 weeks    PT Treatment/Interventions ADLs/Self Care Home Management;Cryotherapy;Electrical Stimulation;Iontophoresis 4mg /ml Dexamethasone;Moist Heat;Traction;Ultrasound;Neuromuscular re-education;Balance training;Therapeutic exercise;Therapeutic activities;Functional mobility training;Stair training;Gait  training;Patient/family education;Manual techniques;Dry needling;Passive range  of motion;Taping;Spinal Manipulations;Joint Manipulations    PT Next Visit Plan check response to dry needling and spinal manipulation for LBP, continue progression rotator cuff/periscapular stabilization, lumbar/core strengthening, further manual, spinal manipulations and dry needling prn pending response    PT Home Exercise Plan [email or web address]: banded shoulder ER/IR with red,Theraband row, extension, supine horizontal abduction ,standing shoulder flexion and scaption, pelvic tilt, LTR, piriformis stretch, thomas hip flexor stretch, bridge, deadbug, bird dog, hamstring stretch    Consulted and Agree with Plan of Care Patient           Patient will benefit from skilled therapeutic intervention in order to improve the following deficits and impairments:  Decreased range of motion,Postural dysfunction,Decreased strength,Pain,Decreased activity tolerance,Impaired flexibility  Visit Diagnosis: Chronic right shoulder pain  Chronic bilateral low back pain, unspecified whether sciatica present  Muscle weakness (generalized)     Problem List Patient Active Problem List   Diagnosis Date Noted  . Primary localized osteoarthritis of left knee 12/29/2014  . DJD (degenerative joint disease) of knee 12/29/2014  . Tear of lateral meniscus of left knee 12/05/2013  . Primary osteoarthritis of left knee 11/07/2013  . Right-sided low back pain with right-sided sciatica 11/07/2013  . MUSCLE STRAIN, LEFT CALF 10/07/2009  . HERNIATED LUMBAR DISK WITH RADICULOPATHY 08/26/2008  . ADJ DISORDER WITH MIXED ANXIETY & DEPRESSED MOOD 07/30/2008    Beaulah Dinning, PT, DPT 03/05/20 9:45 PM  [place] [facility] [address] [place], [place], [address] Phone: [phone]   Fax:  [phone]  Name: Adam Avila MRN: [id number] Date of Birth: 06-Apr-1960

## 2020-03-10 ENCOUNTER — Ambulatory Visit: Payer: 59 | Admitting: Physical Therapy

## 2020-03-10 ENCOUNTER — Encounter: Payer: Self-pay | Admitting: Physical Therapy

## 2020-03-10 ENCOUNTER — Other Ambulatory Visit: Payer: Self-pay

## 2020-03-10 DIAGNOSIS — M545 Low back pain, unspecified: Secondary | ICD-10-CM | POA: Diagnosis not present

## 2020-03-10 DIAGNOSIS — M6281 Muscle weakness (generalized): Secondary | ICD-10-CM | POA: Diagnosis not present

## 2020-03-10 DIAGNOSIS — G8929 Other chronic pain: Secondary | ICD-10-CM

## 2020-03-10 DIAGNOSIS — M25511 Pain in right shoulder: Secondary | ICD-10-CM | POA: Diagnosis not present

## 2020-03-10 NOTE — Patient Instructions (Signed)
Access Code: [email or web address] URL: [email or web address] Date: 03/10/2020 Prepared by: Hilda Blades  Exercises Shoulder External Rotation with Anchored Resistance - 1 x daily - 4 x weekly - 3 sets - 15 reps Shoulder Internal Rotation with Resistance - 1 x daily - 4 x weekly - 3 sets - 10 reps Standing Shoulder Row with Anchored Resistance - 1 x daily - 4 x weekly - 3 sets - 10 reps Shoulder Extension with Resistance - Palms Forward - 1 x daily - 4 x weekly - 3 sets - 10 reps Supine Shoulder Horizontal Abduction with Resistance - 1 x daily - 4 x weekly - 3 sets - 10 reps Scaption with Dumbbells - 1 x daily - 4 x weekly - 2-3 sets - 10 reps Standing Shoulder Flexion to 90 Degrees with Dumbbells - 1 x daily - 4 x weekly - 2-3 sets - 10 reps Wall Push Up with Plus - 1 x daily - 4 x weekly - 3 sets - 10 reps Supine Lower Trunk Rotation - 1-2 x daily - 7 x weekly - 5 reps - 5-10 seconds hold Supine Piriformis Stretch with Foot on Ground - 1-2 x daily - 7 x weekly - 3 reps - 30 seconds hold Supine Posterior Pelvic Tilt - 1 x daily - 4 x weekly - 2 sets - 10 reps - 3-5 seconds hold Marching Bridge - 1 x daily - 4 x weekly - 3 sets - 10 reps Dead Bug - 1 x daily - 4 x weekly - 3 sets - 10 reps Thomas Stretch on Table - 1-2 x daily - 7 x weekly - 3 reps - 30 seconds hold Supine Hamstring Stretch with Strap - 1-2 x daily - 7 x weekly - 1 sets - 3 reps - 30 seconds hold Bird Dog - 1 x daily - 4 x weekly - 2 sets - 10 reps

## 2020-03-10 NOTE — Therapy (Signed)
[place], [place], [address] Phone: [phone]   Fax:  [phone]  Physical Therapy Treatment / Discharge  Patient Details  Name: Adam Avila MRN: [id number] Date of Birth: Dec 11, 1960 Referring Provider (PT): Matthew Saras, [place]   Encounter Date: 03/10/2020   PT End of Session - 03/10/20 1255    Visit Number 4    Number of Visits 8    Date for PT Re-Evaluation 04/13/20    Authorization Type [facility]    PT Start Time 1215    PT Stop Time 1300    PT Time Calculation (min) 45 min    Activity Tolerance Patient tolerated treatment well    Behavior During Therapy [facility] for tasks assessed/performed           Past Medical History:  Diagnosis Date  . Arthritis    "back" (12/29/2014)  . DDD (degenerative disc disease), lumbar   . HNP (herniated nucleus pulposus), lumbar    l4-5  . Hypertension   . Kidney stones   . Primary localized osteoarthritis of left knee 12/29/2014  . Snoring     Past Surgical History:  Procedure Laterality Date  . ADENOIDECTOMY  ~ 1970   "not tonsils"  . COLONOSCOPY    . CYSTOSCOPY W/ STONE MANIPULATION  ~ 2002  . LASIK Bilateral 2008  . LITHOTRIPSY  X 1  . TOTAL KNEE ARTHROPLASTY Left 12/29/2014  . TOTAL KNEE ARTHROPLASTY Left 12/29/2014   Procedure: LEFT TOTAL KNEE ARTHROPLASTY;  Surgeon: Elsie Saas, MD;  Location: [place];  Service: Orthopedics;  Laterality: Left;    There were no vitals filed for this visit.   Subjective Assessment - 03/10/20 1222    Subjective Patient reports feeling better following last visit. He reports he does exercises regularly, does mostly elliptical. He does note he did a lot of sitting over the weekend and had some achiness.    Patient Stated Goals Be able to exercise and active with less pain    Currently in Pain? No/denies              [facility] PT Assessment - 03/10/20 0001      Assessment   Medical Diagnosis Chronic lower  back and right shoulder pain    Referring Provider (PT) Shepperson, Kirstin, PA-C      Balance Screen   Has the patient fallen in the past 6 months No    Has the patient had a decrease in activity level because of a fear of falling?  No    Is the patient reluctant to leave their home because of a fear of falling?  No      Prior Function   Level of Independence Independent      Observation/Other Assessments   Focus on Therapeutic Outcomes (FOTO)  Lumbar: 72% functional status, Shoulder: 87% functional status      AROM   Overall AROM Comments Shoulder AROM grossly WFL and equal bilaterally with no pain reported      Strength   Overall Strength Comments Shoulder strength grossly 4+/5 MMT and hip/core strength grossly 4/5 MMT                         [facility] Adult PT Treatment/Exercise - 03/10/20 0001      Self-Care   Self-Care Other Self-Care Comments    Other Self-Care Comments  FOTO, POC discharge      Exercises   Exercises Lumbar;Shoulder  Lumbar Exercises: Aerobic   Elliptical L5 Ramp 5 x 6 min      Lumbar Exercises: Machines for Strengthening   Leg Press 85# x 10, 125# 2 x 10      Lumbar Exercises: Supine   Pelvic Tilt 10 reps    Dead Bug 10 reps   2 sets   Bridge with March 10 reps   2 sets     Lumbar Exercises: Quadruped   Opposite Arm/Leg Raise 10 reps   2 sets     Shoulder Exercises: ROM/Strengthening   Lat Pull Limitations 20# x 10, 25# 2 x 10    Cybex Row Limitations 35# 3 x 10                  PT Education - 03/10/20 1224    Education Details HEP, gym based strengthening, POC discharge, FOTO    Person(s) Educated Patient    Methods Explanation    Comprehension Verbalized understanding            PT Short Term Goals - 03/10/20 1310      PT SHORT TERM GOAL #1   Title Patient will be I with initial HEP to progress with PT    Time 4    Period Weeks    Status Achieved    Target Date 03/16/20      PT SHORT TERM GOAL #2    Title PT will review FOTO with patient by 3rd visit    Time 3    Period Weeks    Status Achieved    Target Date 03/09/20      PT SHORT TERM GOAL #3   Title Patient will exhibit right shoulder elevation equal to opposite side with </= 2/10 pain level    Time 4    Period Weeks    Status Achieved    Target Date 03/16/20             PT Long Term Goals - 03/10/20 1311      PT LONG TERM GOAL #1   Title Patient will be I with final HEP to maintain progress from PT    Time 8    Period Weeks    Status Achieved      PT LONG TERM GOAL #2   Title Patient will report improved functional status >/= 71% shoulder and >/= 59% lumbar on FOTO    Time 8    Period Weeks    Status Achieved      PT LONG TERM GOAL #3   Title Patient will demonstrate >/= 4+/5 MMT right shoulder strength to improve lifting overhead    Time 8    Period Weeks    Status Achieved      PT LONG TERM GOAL #4   Title Patient will demonstrate >/= 4/5 MMT of gross core/hips to improve standing tolerance and job related tasks    Time 8    Period Weeks    Status Achieved      PT LONG TERM GOAL #5   Title Patient will be report </= 1/10 pain at rest for low back and shoulder, with </= 3/10 pain with all activity    Time 8    Period Weeks    Status Achieved                 Plan - 03/10/20 1256    Clinical Impression Statement Patient tolerated therapy well with no adverse effects. He has achieved all established goals  and demonstrates independence with HEP. He does note occasional shoulder pain when he has to lift something with shoulder in awkward position, otherwise he feels much improved. He was instructed on gym based machine strengthening and patient feels comfortable with self-progression. Patient will be discharged from PT.    PT Next Visit Plan NA - discharge    PT Home Exercise Plan [email or web address] - see patient instructions    Consulted and Agree with Plan of Care Patient               Visit  Diagnosis: Chronic right shoulder pain  Chronic bilateral low back pain, unspecified whether sciatica present  Muscle weakness (generalized)     Problem List Patient Active Problem List   Diagnosis Date Noted  . Primary localized osteoarthritis of left knee 12/29/2014  . DJD (degenerative joint disease) of knee 12/29/2014  . Tear of lateral meniscus of left knee 12/05/2013  . Primary osteoarthritis of left knee 11/07/2013  . Right-sided low back pain with right-sided sciatica 11/07/2013  . MUSCLE STRAIN, LEFT CALF 10/07/2009  . HERNIATED LUMBAR DISK WITH RADICULOPATHY 08/26/2008  . ADJ DISORDER WITH MIXED ANXIETY & DEPRESSED MOOD 07/30/2008    Hilda Blades, PT, DPT, LAT, ATC 03/10/20  1:47 PM Phone: [phone] Fax: [place] [facility] [address] [place], [place], [address] Phone: [phone]   Fax:  [phone]  Name: Adam Avila MRN: [id number] Date of Birth: 07-31-1960   PHYSICAL THERAPY DISCHARGE SUMMARY  Visits from Start of Care: 4  Current functional level related to goals / functional outcomes: See above   Remaining deficits: See above   Education / Equipment: HEP  Plan: Patient agrees to discharge.  Patient goals were met. Patient is being discharged due to meeting the stated rehab goals.  ?????

## 2020-03-11 DIAGNOSIS — G4733 Obstructive sleep apnea (adult) (pediatric): Secondary | ICD-10-CM | POA: Diagnosis not present

## 2020-03-17 ENCOUNTER — Ambulatory Visit: Payer: 59 | Admitting: Physical Therapy

## 2020-03-18 MED FILL — CELECOXIB 200 MG CAP: 200 | 30 days supply | Qty: 30 | Fill #2

## 2020-03-23 MED FILL — HYDROCHLOROTHIAZIDE 12.5 MG: 12.5 | 90 days supply | Qty: 90 | Fill #3

## 2020-03-26 MED FILL — LOSARTAN POTASSIUM 100 MG T: 100 | 30 days supply | Qty: 30 | Fill #4

## 2020-04-14 DIAGNOSIS — H52223 Regular astigmatism, bilateral: Secondary | ICD-10-CM | POA: Diagnosis not present

## 2020-04-14 DIAGNOSIS — H5203 Hypermetropia, bilateral: Secondary | ICD-10-CM | POA: Diagnosis not present

## 2020-04-24 ENCOUNTER — Other Ambulatory Visit (HOSPITAL_COMMUNITY): Payer: Self-pay | Admitting: Family Medicine

## 2020-04-24 DIAGNOSIS — I10 Essential (primary) hypertension: Secondary | ICD-10-CM | POA: Diagnosis not present

## 2020-04-24 DIAGNOSIS — E291 Testicular hypofunction: Secondary | ICD-10-CM | POA: Diagnosis not present

## 2020-04-24 DIAGNOSIS — Z13220 Encounter for screening for lipoid disorders: Secondary | ICD-10-CM | POA: Diagnosis not present

## 2020-04-24 DIAGNOSIS — Z0000 Encounter for general adult medical examination without abnormal findings: Secondary | ICD-10-CM | POA: Diagnosis not present

## 2020-04-24 DIAGNOSIS — Z8616 Personal history of COVID-19: Secondary | ICD-10-CM | POA: Diagnosis not present

## 2020-04-24 DIAGNOSIS — E538 Deficiency of other specified B group vitamins: Secondary | ICD-10-CM | POA: Diagnosis not present

## 2020-04-24 DIAGNOSIS — Z125 Encounter for screening for malignant neoplasm of prostate: Secondary | ICD-10-CM | POA: Diagnosis not present

## 2020-04-24 DIAGNOSIS — R5383 Other fatigue: Secondary | ICD-10-CM | POA: Diagnosis not present

## 2020-04-24 DIAGNOSIS — M179 Osteoarthritis of knee, unspecified: Secondary | ICD-10-CM | POA: Diagnosis not present

## 2020-04-24 MED FILL — TADALAFIL 5 MG TABS: 5 | 30 days supply | Qty: 30 | Fill #0

## 2020-04-24 MED FILL — LOSARTAN POTASSIUM 100 MG T: 100 | 90 days supply | Qty: 90 | Fill #0

## 2020-04-24 MED FILL — CELECOXIB 200 MG CAP: 200 | 45 days supply | Qty: 90 | Fill #0

## 2020-04-28 DIAGNOSIS — E538 Deficiency of other specified B group vitamins: Secondary | ICD-10-CM | POA: Diagnosis not present

## 2020-05-15 DIAGNOSIS — M179 Osteoarthritis of knee, unspecified: Secondary | ICD-10-CM | POA: Diagnosis not present

## 2020-05-15 DIAGNOSIS — E538 Deficiency of other specified B group vitamins: Secondary | ICD-10-CM | POA: Diagnosis not present

## 2020-05-15 DIAGNOSIS — E291 Testicular hypofunction: Secondary | ICD-10-CM | POA: Diagnosis not present

## 2020-05-15 DIAGNOSIS — R5383 Other fatigue: Secondary | ICD-10-CM | POA: Diagnosis not present

## 2020-05-15 DIAGNOSIS — Z8616 Personal history of COVID-19: Secondary | ICD-10-CM | POA: Diagnosis not present

## 2020-05-15 DIAGNOSIS — I10 Essential (primary) hypertension: Secondary | ICD-10-CM | POA: Diagnosis not present

## 2020-05-15 DIAGNOSIS — R7989 Other specified abnormal findings of blood chemistry: Secondary | ICD-10-CM | POA: Diagnosis not present

## 2020-05-15 DIAGNOSIS — Z0000 Encounter for general adult medical examination without abnormal findings: Secondary | ICD-10-CM | POA: Diagnosis not present

## 2020-05-26 DIAGNOSIS — G4733 Obstructive sleep apnea (adult) (pediatric): Secondary | ICD-10-CM | POA: Diagnosis not present

## 2020-05-28 DIAGNOSIS — E538 Deficiency of other specified B group vitamins: Secondary | ICD-10-CM | POA: Diagnosis not present

## 2020-06-21 ENCOUNTER — Other Ambulatory Visit (HOSPITAL_COMMUNITY): Payer: Self-pay

## 2020-06-22 ENCOUNTER — Other Ambulatory Visit (HOSPITAL_COMMUNITY): Payer: Self-pay

## 2020-06-22 MED ORDER — HYDROCHLOROTHIAZIDE 12.5 MG PO CAPS
12.5000 mg | ORAL_CAPSULE | Freq: Every morning | ORAL | 3 refills | Status: DC
  Filled 2020-06-22: qty 90, 90d supply, fill #0
  Filled 2020-09-18: qty 90, 90d supply, fill #1
  Filled 2020-12-03: qty 90, 90d supply, fill #2

## 2020-06-23 ENCOUNTER — Other Ambulatory Visit (HOSPITAL_COMMUNITY): Payer: Self-pay

## 2020-06-24 DIAGNOSIS — G4733 Obstructive sleep apnea (adult) (pediatric): Secondary | ICD-10-CM | POA: Diagnosis not present

## 2020-06-26 ENCOUNTER — Other Ambulatory Visit (HOSPITAL_COMMUNITY): Payer: Self-pay

## 2020-06-26 MED ORDER — AMOXICILLIN 250 MG PO CAPS
250.0000 mg | ORAL_CAPSULE | Freq: Four times a day (QID) | ORAL | 0 refills | Status: DC
  Filled 2020-06-26: qty 40, 10d supply, fill #0

## 2020-06-29 DIAGNOSIS — E538 Deficiency of other specified B group vitamins: Secondary | ICD-10-CM | POA: Diagnosis not present

## 2020-07-01 ENCOUNTER — Other Ambulatory Visit (HOSPITAL_COMMUNITY): Payer: Self-pay

## 2020-07-17 ENCOUNTER — Other Ambulatory Visit (HOSPITAL_COMMUNITY): Payer: Self-pay

## 2020-07-17 MED FILL — Celecoxib Cap 200 MG: ORAL | 45 days supply | Qty: 90 | Fill #0 | Status: AC

## 2020-07-17 MED FILL — Tadalafil Tab 5 MG: ORAL | 30 days supply | Qty: 30 | Fill #0 | Status: AC

## 2020-07-30 DIAGNOSIS — E538 Deficiency of other specified B group vitamins: Secondary | ICD-10-CM | POA: Diagnosis not present

## 2020-08-01 MED FILL — Losartan Potassium Tab 100 MG: ORAL | 90 days supply | Qty: 90 | Fill #0 | Status: AC

## 2020-08-03 ENCOUNTER — Other Ambulatory Visit (HOSPITAL_COMMUNITY): Payer: Self-pay

## 2020-08-05 ENCOUNTER — Other Ambulatory Visit (HOSPITAL_COMMUNITY): Payer: Self-pay

## 2020-08-05 MED ORDER — AMOXICILLIN 500 MG PO CAPS
2000.0000 mg | ORAL_CAPSULE | ORAL | 2 refills | Status: DC | PRN
  Filled 2020-08-05: qty 8, 2d supply, fill #0
  Filled 2020-12-18: qty 8, 2d supply, fill #1

## 2020-08-16 MED FILL — Tadalafil Tab 5 MG: ORAL | 30 days supply | Qty: 30 | Fill #1 | Status: AC

## 2020-08-18 ENCOUNTER — Other Ambulatory Visit (HOSPITAL_COMMUNITY): Payer: Self-pay

## 2020-08-31 DIAGNOSIS — E538 Deficiency of other specified B group vitamins: Secondary | ICD-10-CM | POA: Diagnosis not present

## 2020-09-18 ENCOUNTER — Other Ambulatory Visit (HOSPITAL_COMMUNITY): Payer: Self-pay

## 2020-09-18 MED FILL — Celecoxib Cap 200 MG: ORAL | 45 days supply | Qty: 90 | Fill #1 | Status: AC

## 2020-09-28 DIAGNOSIS — E538 Deficiency of other specified B group vitamins: Secondary | ICD-10-CM | POA: Diagnosis not present

## 2020-10-10 MED FILL — Tadalafil Tab 5 MG: ORAL | 30 days supply | Qty: 30 | Fill #2 | Status: AC

## 2020-10-12 ENCOUNTER — Other Ambulatory Visit (HOSPITAL_COMMUNITY): Payer: Self-pay

## 2020-10-13 ENCOUNTER — Other Ambulatory Visit (HOSPITAL_COMMUNITY): Payer: Self-pay

## 2020-10-13 DIAGNOSIS — R1010 Upper abdominal pain, unspecified: Secondary | ICD-10-CM | POA: Diagnosis not present

## 2020-10-13 DIAGNOSIS — Z6841 Body Mass Index (BMI) 40.0 and over, adult: Secondary | ICD-10-CM | POA: Diagnosis not present

## 2020-10-13 DIAGNOSIS — R1032 Left lower quadrant pain: Secondary | ICD-10-CM | POA: Diagnosis not present

## 2020-10-13 MED ORDER — NITROFURANTOIN MONOHYD MACRO 100 MG PO CAPS
100.0000 mg | ORAL_CAPSULE | Freq: Two times a day (BID) | ORAL | 0 refills | Status: AC
  Filled 2020-10-13: qty 14, 7d supply, fill #0

## 2020-10-13 MED ORDER — CYCLOBENZAPRINE HCL 10 MG PO TABS
10.0000 mg | ORAL_TABLET | Freq: Every evening | ORAL | 0 refills | Status: DC | PRN
  Filled 2020-10-13: qty 20, 20d supply, fill #0

## 2020-10-13 MED ORDER — TAMSULOSIN HCL 0.4 MG PO CAPS
0.4000 mg | ORAL_CAPSULE | Freq: Every day | ORAL | 0 refills | Status: AC
  Filled 2020-10-13: qty 20, 20d supply, fill #0

## 2020-10-14 ENCOUNTER — Other Ambulatory Visit (HOSPITAL_COMMUNITY): Payer: Self-pay | Admitting: Home Modifications

## 2020-10-14 DIAGNOSIS — R10A0 Flank pain, unspecified side: Secondary | ICD-10-CM

## 2020-10-14 DIAGNOSIS — R109 Unspecified abdominal pain: Secondary | ICD-10-CM

## 2020-10-14 DIAGNOSIS — R319 Hematuria, unspecified: Secondary | ICD-10-CM

## 2020-10-22 ENCOUNTER — Other Ambulatory Visit: Payer: Self-pay

## 2020-10-22 ENCOUNTER — Ambulatory Visit (HOSPITAL_COMMUNITY)
Admission: RE | Admit: 2020-10-22 | Discharge: 2020-10-22 | Disposition: A | Discharge status: Home / Self Care | Payer: 59 | Source: Ambulatory Visit | Attending: Home Modifications | Admitting: Home Modifications

## 2020-10-22 DIAGNOSIS — N132 Hydronephrosis with renal and ureteral calculous obstruction: Secondary | ICD-10-CM | POA: Diagnosis not present

## 2020-10-22 DIAGNOSIS — R109 Unspecified abdominal pain: Secondary | ICD-10-CM | POA: Diagnosis not present

## 2020-10-22 DIAGNOSIS — I700 Atherosclerosis of aorta: Secondary | ICD-10-CM | POA: Diagnosis not present

## 2020-10-22 DIAGNOSIS — R319 Hematuria, unspecified: Secondary | ICD-10-CM | POA: Diagnosis not present

## 2020-10-22 DIAGNOSIS — M160 Bilateral primary osteoarthritis of hip: Secondary | ICD-10-CM | POA: Diagnosis not present

## 2020-10-22 DIAGNOSIS — K769 Liver disease, unspecified: Secondary | ICD-10-CM | POA: Diagnosis not present

## 2020-10-28 DIAGNOSIS — I10 Essential (primary) hypertension: Secondary | ICD-10-CM | POA: Diagnosis not present

## 2020-10-28 DIAGNOSIS — E538 Deficiency of other specified B group vitamins: Secondary | ICD-10-CM | POA: Diagnosis not present

## 2020-10-28 DIAGNOSIS — K7689 Other specified diseases of liver: Secondary | ICD-10-CM | POA: Diagnosis not present

## 2020-10-28 DIAGNOSIS — R5383 Other fatigue: Secondary | ICD-10-CM | POA: Diagnosis not present

## 2020-10-28 DIAGNOSIS — N529 Male erectile dysfunction, unspecified: Secondary | ICD-10-CM | POA: Diagnosis not present

## 2020-10-28 DIAGNOSIS — Z0000 Encounter for general adult medical examination without abnormal findings: Secondary | ICD-10-CM | POA: Diagnosis not present

## 2020-10-28 DIAGNOSIS — Z8616 Personal history of COVID-19: Secondary | ICD-10-CM | POA: Diagnosis not present

## 2020-10-28 DIAGNOSIS — N200 Calculus of kidney: Secondary | ICD-10-CM | POA: Diagnosis not present

## 2020-11-02 ENCOUNTER — Other Ambulatory Visit (HOSPITAL_COMMUNITY): Payer: Self-pay

## 2020-11-02 MED ORDER — HYDROCHLOROTHIAZIDE 12.5 MG PO CAPS
12.5000 mg | ORAL_CAPSULE | Freq: Every day | ORAL | 3 refills | Status: DC | PRN
  Filled 2020-11-09 – 2021-03-20 (×3): qty 90, 90d supply, fill #0

## 2020-11-02 MED FILL — Hydrochlorothiazide Cap 12.5 MG: ORAL | 90 days supply | Qty: 90 | Fill #0 | Status: CN

## 2020-11-02 MED FILL — Losartan Potassium Tab 100 MG: ORAL | 90 days supply | Qty: 90 | Fill #1 | Status: AC

## 2020-11-03 DIAGNOSIS — R8271 Bacteriuria: Secondary | ICD-10-CM | POA: Diagnosis not present

## 2020-11-03 DIAGNOSIS — N200 Calculus of kidney: Secondary | ICD-10-CM | POA: Diagnosis not present

## 2020-11-09 ENCOUNTER — Other Ambulatory Visit (HOSPITAL_COMMUNITY): Payer: Self-pay

## 2020-11-09 MED FILL — Tadalafil Tab 5 MG: ORAL | 30 days supply | Qty: 30 | Fill #3 | Status: AC

## 2020-11-12 ENCOUNTER — Other Ambulatory Visit: Payer: Self-pay | Admitting: Urology

## 2020-12-03 ENCOUNTER — Other Ambulatory Visit (HOSPITAL_COMMUNITY): Payer: Self-pay

## 2020-12-03 MED ORDER — CELECOXIB 200 MG PO CAPS
200.0000 mg | ORAL_CAPSULE | Freq: Two times a day (BID) | ORAL | 1 refills | Status: DC
  Filled 2020-12-03: qty 180, 90d supply, fill #0

## 2020-12-04 ENCOUNTER — Other Ambulatory Visit (HOSPITAL_COMMUNITY): Payer: Self-pay

## 2020-12-07 ENCOUNTER — Other Ambulatory Visit (HOSPITAL_COMMUNITY): Payer: Self-pay

## 2020-12-14 ENCOUNTER — Telehealth: Payer: 59 | Admitting: Physician Assistant

## 2020-12-14 ENCOUNTER — Other Ambulatory Visit (HOSPITAL_COMMUNITY): Payer: Self-pay

## 2020-12-14 ENCOUNTER — Encounter (HOSPITAL_COMMUNITY): Payer: Self-pay

## 2020-12-14 DIAGNOSIS — R6889 Other general symptoms and signs: Secondary | ICD-10-CM | POA: Diagnosis not present

## 2020-12-14 MED ORDER — BENZONATATE 100 MG PO CAPS
100.0000 mg | ORAL_CAPSULE | Freq: Three times a day (TID) | ORAL | 0 refills | Status: AC
  Filled 2020-12-14: qty 15, 5d supply, fill #0

## 2020-12-14 NOTE — Progress Notes (Signed)
E visit for Flu like symptoms   We are sorry that you are not feeling well.  Here is how we plan to help! Based on what you have shared with me it looks like you may have a respiratory virus that may be influenza.. Your symptoms could also be consistent with the coronavirus and I suggest that you get tested for both.  Influenza or "the flu" is   an infection caused by a respiratory virus. The flu virus is highly contagious and persons who did not receive their yearly flu vaccination may "catch" the flu from close contact.  We have anti-viral medications to treat the viruses that cause this infection. They are not a "cure" and only shorten the course of the infection. These prescriptions are most effective when they are given within the first 2 days of "flu" symptoms. Antiviral medication are indicated if you have a high risk of complications from the flu. You should  also consider an antiviral medication if you are in close contact with someone who is at risk. These medications can help patients avoid complications from the flu  but have side effects that you should know. Possible side effects from Tamiflu or oseltamivir include nausea, vomiting, diarrhea, dizziness, headaches, eye redness, sleep problems or other respiratory symptoms. You should not take Tamiflu if you have an allergy to oseltamivir or any to the ingredients in Tamiflu.  Based upon your symptoms and potential risk factors I recommend that you follow the flu symptoms recommendation that I have listed below.  I have prescribed Tessalon for your cough. You can take Tylenol and motrin for your body aches.   ANYONE WHO HAS FLU SYMPTOMS SHOULD: Stay home. The flu is highly contagious and going out or to work exposes others! Be sure to drink plenty of fluids. Water is fine as well as fruit juices, sodas and electrolyte beverages. You may want to stay away from caffeine or alcohol. If you are nauseated, try taking small sips of liquids. How  do you know if you are getting enough fluid? Your urine should be a pale yellow or almost colorless. Get rest. Taking a steamy shower or using a humidifier may help nasal congestion and ease sore throat pain. Using a saline nasal spray works much the same way. Cough drops, hard candies and sore throat lozenges may ease your cough. Line up a caregiver. Have someone check on you regularly.   GET HELP RIGHT AWAY IF: You cannot keep down liquids or your medications. You become short of breath Your fell like you are going to pass out or loose consciousness. Your symptoms persist after you have completed your treatment plan MAKE SURE YOU  Understand these instructions. Will watch your condition. Will get help right away if you are not doing well or get worse.  Your e-visit answers were reviewed by a board certified advanced clinical practitioner to complete your personal care plan.  Depending on the condition, your plan could have included both over the counter or prescription medications.  If there is a problem please reply  once you have received a response from your provider.  Your safety is important to [place].  If you have drug allergies check your prescription carefully.    You can use MyChart to ask questions about today's visit, request a non-urgent call back, or ask for a work or school excuse for 24 hours related to this e-Visit. If it has been greater than 24 hours you will need to follow up with  your provider, or enter a new e-Visit to address those concerns.  You will get an e-mail in the next two days asking about your experience.  I hope that your e-visit has been valuable and will speed your recovery. Thank you for using e-visits.  Approximately 5 minutes was spent documenting and reviewing patient's chart.

## 2020-12-14 NOTE — Patient Instructions (Addendum)
DUE TO COVID-19 ONLY ONE VISITOR IS ALLOWED TO COME WITH YOU AND STAY IN THE WAITING ROOM ONLY DURING PRE OP AND PROCEDURE DAY OF SURGERY.   Up to two visitors ages 16+ are allowed at one time in a patient's room.  The visitors may rotate out with other people throughout the day.  Additionally, up to two children between the ages of 29 and 59 are allowed and do not count toward the number of allowed visitors.  Children within this age range must be accompanied by an adult visitor.  One adult visitor may remain with the patient overnight and must be in the room by 8 PM.  YOU NEED TO HAVE A COVID 19 TEST ON___11-14-22____ between 8am-3pm______, THIS TEST MUST BE DONE BEFORE SURGERY,     Please bring completed form with you to the COVID testing site   COVID TESTING SITE [place] TEST IS COMPLETED,  PLEASE Wear a mask when in public           Your procedure is scheduled on: 12-30-20    Report to [facility] Main  Entrance   Report to admitting at       [place] AM     Call this number if you have problems the morning of surgery [phone]   Remember: Do not eat food  :After Midnight. YOU MAY HAVE CLEAR LIQUIDS UNTIL 0530 AM THEN NOTHING BY MOUTH    CLEAR LIQUID DIET   Foods Allowed                                                                     Foods Excluded  Coffee and tea, regular and decaf                             liquids that you cannot  Plain Jell-O any favor except red or purple                                           see through such as: Fruit ices (not with fruit pulp)                                     milk, soups, orange juice  Iced Popsicles                                    All solid food                                 Cranberry, grape and apple juices Sports drinks like Gatorade Lightly seasoned clear broth or consume(fat  free) Sugar   _____________________________________________________________________     BRUSH YOUR TEETH MORNING OF SURGERY AND RINSE YOUR MOUTH OUT, NO CHEWING GUM CANDY OR MINTS.     Take these medicines  the morning of surgery with A SIP OF WATER: Flonase, zyrtec                                 You may not have any metal on your body including hair pins and              piercings  Do not wear jewelry, lotions, powders,perfumes,  or      deodorant                        Men may shave face and neck.   Do not bring valuables to the hospital. [place].  Contacts, dentures or bridgework may not be worn into surgery.  You may bring a small overnight bag with you     Patients discharged the day of surgery will not be allowed to drive home. IF YOU ARE HAVING SURGERY AND GOING HOME THE SAME DAY, YOU MUST HAVE AN ADULT TO DRIVE YOU HOME AND BE WITH YOU FOR 24 HOURS. YOU MAY GO HOME BY TAXI OR UBER OR ORTHERWISE, BUT AN ADULT MUST ACCOMPANY YOU HOME AND STAY WITH YOU FOR 24 HOURS.  Name and phone number of your driver:  Special Instructions: N/A              Please read over the following fact sheets you were given: _____________________________________________________________________             [facility] - Preparing for Surgery Before surgery, you can play an important role.  Because skin is not sterile, your skin needs to be as free of germs as possible.  You can reduce the number of germs on your skin by washing with CHG (chlorahexidine gluconate) soap before surgery.  CHG is an antiseptic cleaner which kills germs and bonds with the skin to continue killing germs even after washing. Please DO NOT use if you have an allergy to CHG or antibacterial soaps.  If your skin becomes reddened/irritated stop using the CHG and inform your nurse when you arrive at Short Stay. Do not shave (including legs and underarms) for at least 48 hours  prior to the first CHG shower.  You may shave your face/neck. Please follow these instructions carefully:  1.  Shower with CHG Soap the night before surgery and the  morning of Surgery.  2.  If you choose to wash your hair, wash your hair first as usual with your  normal  shampoo.  3.  After you shampoo, rinse your hair and body thoroughly to remove the  shampoo.                           4.  Use CHG as you would any other liquid soap.  You can apply chg directly  to the skin and wash                       Gently with a scrungie or clean washcloth.  5.  Apply the CHG Soap to your body ONLY FROM THE NECK DOWN.   Do not use on face/ open  Wound or open sores. Avoid contact with eyes, ears mouth and genitals (private parts).                       Wash face,  Genitals (private parts) with your normal soap.             6.  Wash thoroughly, paying special attention to the area where your surgery  will be performed.  7.  Thoroughly rinse your body with warm water from the neck down.  8.  DO NOT shower/wash with your normal soap after using and rinsing off  the CHG Soap.                9.  Pat yourself dry with a clean towel.            10.  Wear clean pajamas.            11.  Place clean sheets on your bed the night of your first shower and do not  sleep with pets. Day of Surgery : Do not apply any lotions/deodorants the morning of surgery.  Please wear clean clothes to the hospital/surgery center.  FAILURE TO FOLLOW THESE INSTRUCTIONS MAY RESULT IN THE CANCELLATION OF YOUR SURGERY PATIENT SIGNATURE_________________________________  NURSE SIGNATURE__________________________________  ________________________________________________________________________

## 2020-12-18 ENCOUNTER — Encounter (HOSPITAL_COMMUNITY)
Admission: RE | Admit: 2020-12-18 | Discharge: 2020-12-18 | Disposition: A | Discharge status: Home / Self Care | Payer: 59 | Source: Ambulatory Visit | Attending: Urology | Admitting: Urology

## 2020-12-18 ENCOUNTER — Encounter (HOSPITAL_COMMUNITY): Payer: Self-pay

## 2020-12-18 ENCOUNTER — Other Ambulatory Visit: Payer: Self-pay

## 2020-12-18 VITALS — BP 135/93 | HR 60 | Temp 98.2°F | Resp 18 | Ht 71.0 in | Wt 288.2 lb

## 2020-12-18 DIAGNOSIS — Z01818 Encounter for other preprocedural examination: Secondary | ICD-10-CM | POA: Diagnosis not present

## 2020-12-18 HISTORY — DX: Personal history of urinary calculi: Z87.442

## 2020-12-18 HISTORY — DX: Sleep apnea, unspecified: G47.30

## 2020-12-18 LAB — BASIC METABOLIC PANEL
Anion gap: 8 (ref 5–15)
BUN: 22 mg/dL (ref 8–23)
CO2: 24 mmol/L (ref 22–32)
Calcium: 9 mg/dL (ref 8.9–10.3)
Chloride: 107 mmol/L (ref 98–111)
Creatinine, Ser: 1.09 mg/dL (ref 0.61–1.24)
GFR, Estimated: >60 mL/min (ref >60)
Glucose, Bld: 94 mg/dL (ref 70–99)
Potassium: 3.7 mmol/L (ref 3.5–5.1)
Sodium: 139 mmol/L (ref 135–145)

## 2020-12-18 LAB — CBC
HCT: 42.7 % (ref 39.0–52.0)
Hemoglobin: 14.9 g/dL (ref 13.0–17.0)
MCH: 31.7 pg (ref 26.0–34.0)
MCHC: 34.9 g/dL (ref 30.0–36.0)
MCV: 90.9 fL (ref 80.0–100.0)
Platelets: 257 10*3/uL (ref 150–400)
RBC: 4.7 MIL/uL (ref 4.22–5.81)
RDW: 12.8 % (ref 11.5–15.5)
WBC: 7.8 10*3/uL (ref 4.0–10.5)
nRBC: 0 % (ref 0.0–0.2)

## 2020-12-18 NOTE — Progress Notes (Addendum)
PCP - Clearance Dr. Melinda Crutch On chart  12-02-20 Cardiologist - no  PPM/ICD -  Device Orders -  Rep Notified -   Chest x-ray -  EKG -  Stress Test -  ECHO -  Cardiac Cath -   Sleep Study -  CPAP -   Fasting Blood Sugar -  Checks Blood Sugar _____ times a day  Blood Thinner Instructions: Aspirin Instructions:  ERAS Protcol - PRE-SURGERY Ensure or G2-   COVID TEST- 12-28-20 COVID vaccine -fully vaccinated [place]  Activity-- Able to walk a flight of stairs without SOB Anesthesia review: HTN , OSA  Patient denies shortness of breath, fever, cough and chest pain at PAT appointment   All instructions explained to the patient, with a verbal understanding of the material. Patient agrees to go over the instructions while at home for a better understanding. Patient also instructed to self quarantine after being tested for COVID-19. The opportunity to ask questions was provided.

## 2020-12-21 ENCOUNTER — Other Ambulatory Visit (HOSPITAL_COMMUNITY): Payer: Self-pay

## 2020-12-28 ENCOUNTER — Other Ambulatory Visit: Payer: Self-pay | Admitting: Urology

## 2020-12-28 LAB — SARS CORONAVIRUS 2 (TAT 6-24 HRS): SARS Coronavirus 2: NEGATIVE

## 2020-12-28 NOTE — H&P (Signed)
CC/HPI: cc: large left renal calculus   11/03/20: 60 year old man with a history of nephrolithiasis referred after noted to have greater than 3 cm a stone burden the left side with mild hydronephrosis. Patient is having occasional left back pain. He denies any gross hematuria, fevers, chills, nausea or vomiting. He has had ESWL and ureteroscopy in the past. He works in Recruitment consultant for [employer].     ALLERGIES: No Allergies    MEDICATIONS: Aspirin 81 MG TABS Oral  CELEBREX PRN  Cialis Daily  Diovan Hct 80 mg-12.5 mg tablet Oral  FLONASE ALLERGY RELIEF NS Daily  LOSARTAN-HYDROCHLOROTHIAZIDE PO Daily  MULTI FOR HER 50 PLUS Daily  ZYRTEC PO PRN     GU PSH: ESWL - about 2003 Remove Kidney Stone - about 2004 Vasectomy - about 1997       PSH Notes: Surgery Of Male Genitalia Vasectomy, Lithotripsy, Lithotomy   NON-GU PSH: None   GU PMH: None     PMH Notes:  1898-02-14 00:00:00 - Note: Normal Routine History And Physical Adult   NON-GU PMH: Hypertension - 1993    FAMILY HISTORY: Death In The Family Father - Sister, Father, Mother Family Health Status Number - [place] In Family nephrolithiasis - Father, Mother Stroke Syndrome - Father   SOCIAL HISTORY: Marital Status: Married Current Smoking Status: Patient has never smoked.  Drinks 4 drinks per week. Social Drinker.  Drinks 1 caffeinated drink per day. Has not had a blood transfusion. 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     Notes: Caffeine Use, Alcohol Use, Marital History - Currently Married, Occupation:, Tobacco Use   REVIEW OF SYSTEMS:    GU Review Male:   Patient denies frequent urination, hard to postpone urination, burning/ pain with urination, get up at night to urinate, leakage of urine, stream starts and stops, trouble starting your stream, have to strain to urinate , erection problems, and penile pain.  Gastrointestinal (Upper):   Patient denies nausea, vomiting, and indigestion/  heartburn.  Gastrointestinal (Lower):   Patient denies diarrhea and constipation.  Constitutional:   Patient denies fever, night sweats, weight loss, and fatigue.  Skin:   Patient denies skin rash/ lesion and itching.  Eyes:   Patient denies blurred vision and double vision.  Ears/ Nose/ Throat:   Patient denies sore throat and sinus problems.  Hematologic/Lymphatic:   Patient denies swollen glands and easy bruising.  Cardiovascular:   Patient denies leg swelling and chest pains.  Respiratory:   Patient denies cough and shortness of breath.  Endocrine:   Patient denies excessive thirst.  Musculoskeletal:   Patient denies back pain and joint pain.  Neurological:   Patient denies dizziness and headaches.  Psychologic:   Patient denies depression and anxiety.   VITAL SIGNS: None   GU PHYSICAL EXAMINATION:      Notes: no LEFT CVA ttp   MULTI-SYSTEM PHYSICAL EXAMINATION:    Constitutional: Well-nourished. No physical deformities. Normally developed. Good grooming.  Neck: Neck symmetrical, not swollen. Normal tracheal position.  Respiratory: No labored breathing, no use of accessory muscles.   Skin: No paleness, no jaundice, no cyanosis. No lesion, no ulcer, no rash.  Neurologic / Psychiatric: Oriented to time, oriented to place, oriented to person. No depression, no anxiety, no agitation.  Gastrointestinal: No rigidity, non-tender LUQ/non-tender LLQ  Eyes: Normal conjunctivae. Normal eyelids.  Ears, Nose, Mouth, and Throat: Left ear no scars, no lesions, no masses. Right ear no scars, no lesions, no masses. Nose no scars,  no lesions, no masses. Normal hearing. Normal lips.  Musculoskeletal: Normal gait and station of head and neck.     Complexity of Data:  Records Review:   Previous Patient Records, POC Tool  Urine Test Review:   Urinalysis  X-Ray Review: C.T. Abdomen/Pelvis: Reviewed Films. Reviewed Report. Discussed With Patient.     PROCEDURES:          Urinalysis  w/Scope Dipstick Dipstick Cont'd Micro  Color: Yellow Bilirubin: Neg mg/dL WBC/hpf: 0 - 5/hpf  Appearance: Clear Ketones: Neg mg/dL RBC/hpf: 10 - 20/hpf  Specific Gravity: 1.020 Blood: 2+ ery/uL Bacteria: Few (10-25/hpf)  pH: 5.5 Protein: 1+ mg/dL Cystals: NS (Not Seen)  Glucose: Neg mg/dL Urobilinogen: 0.2 mg/dL Casts: NS (Not Seen)    Nitrites: Neg Trichomonas: Not Present    Leukocyte Esterase: Neg leu/uL Mucous: Present      Epithelial Cells: 0 - 5/hpf      Yeast: NS (Not Seen)      Sperm: Not Present    ASSESSMENT:      ICD-10 Details  1 GU:   Renal calculus - N20.0 Chronic, Worsening  2   History of urolithiasis - Z87.442 Chronic, Worsening   PLAN:           Orders Labs CULTURE, URINE          Document Letter(s):  Created for Patient: Clinical Summary         Notes:   Patient has approximately 4 cm left renal stone burden including left lower pole and renal pelvis. Due to size of total stone burden I recommended a left percutaneous nephrolithotomy for the patient. We discussed that this will likely be a staged procedure. Risks and benefits of the procedure were discussed with the patient in detail including but not limited to bleeding, pain, infection, damage to surrounding structures, need for further intervention/treatment, inability to remove stones, need for staged procedures. We discussed the patient will need a proximally 2 weeks off of work to recover from this procedure. I will plan on obtaining access with Dr. Louis Meckel.

## 2020-12-30 ENCOUNTER — Observation Stay (HOSPITAL_COMMUNITY)
Admission: RE | Admit: 2020-12-30 | Discharge: 2020-12-31 | Disposition: A | Discharge status: Home / Self Care | Payer: 59 | Source: Ambulatory Visit | Attending: Urology | Admitting: Urology

## 2020-12-30 ENCOUNTER — Encounter (HOSPITAL_COMMUNITY)
Admission: RE | Disposition: A | Discharge status: Home / Self Care | Payer: Self-pay | Source: Ambulatory Visit | Attending: Urology

## 2020-12-30 ENCOUNTER — Ambulatory Visit (HOSPITAL_COMMUNITY): Payer: 59

## 2020-12-30 ENCOUNTER — Encounter (HOSPITAL_COMMUNITY): Payer: Self-pay | Admitting: Urology

## 2020-12-30 ENCOUNTER — Ambulatory Visit (HOSPITAL_COMMUNITY): Payer: 59 | Admitting: Certified Registered Nurse Anesthetist

## 2020-12-30 ENCOUNTER — Other Ambulatory Visit: Payer: Self-pay

## 2020-12-30 DIAGNOSIS — N132 Hydronephrosis with renal and ureteral calculous obstruction: Secondary | ICD-10-CM | POA: Diagnosis not present

## 2020-12-30 DIAGNOSIS — I10 Essential (primary) hypertension: Secondary | ICD-10-CM | POA: Diagnosis not present

## 2020-12-30 DIAGNOSIS — N200 Calculus of kidney: Secondary | ICD-10-CM

## 2020-12-30 DIAGNOSIS — Z9989 Dependence on other enabling machines and devices: Secondary | ICD-10-CM | POA: Diagnosis not present

## 2020-12-30 DIAGNOSIS — G4730 Sleep apnea, unspecified: Secondary | ICD-10-CM | POA: Diagnosis not present

## 2020-12-30 DIAGNOSIS — Z87442 Personal history of urinary calculi: Secondary | ICD-10-CM | POA: Insufficient documentation

## 2020-12-30 HISTORY — PX: NEPHROLITHOTOMY: SHX5134

## 2020-12-30 LAB — CBC WITH DIFFERENTIAL/PLATELET
Abs Immature Granulocytes: 0.1 10*3/uL — ABNORMAL HIGH (ref 0.00–0.07)
Basophils Absolute: 0 10*3/uL (ref 0.0–0.1)
Basophils Relative: 0 %
Eosinophils Absolute: 0 10*3/uL (ref 0.0–0.5)
Eosinophils Relative: 0 %
HCT: 45.3 % (ref 39.0–52.0)
Hemoglobin: 15.6 g/dL (ref 13.0–17.0)
Immature Granulocytes: 1 %
Lymphocytes Relative: 6 %
Lymphs Abs: 0.8 10*3/uL (ref 0.7–4.0)
MCH: 31.7 pg (ref 26.0–34.0)
MCHC: 34.4 g/dL (ref 30.0–36.0)
MCV: 92.1 fL (ref 80.0–100.0)
Monocytes Absolute: 0.3 10*3/uL (ref 0.1–1.0)
Monocytes Relative: 2 %
Neutro Abs: 12.8 10*3/uL — ABNORMAL HIGH (ref 1.7–7.7)
Neutrophils Relative %: 91 %
Platelets: 213 10*3/uL (ref 150–400)
RBC: 4.92 MIL/uL (ref 4.22–5.81)
RDW: 13.2 % (ref 11.5–15.5)
WBC: 14 10*3/uL — ABNORMAL HIGH (ref 4.0–10.5)
nRBC: 0 % (ref 0.0–0.2)

## 2020-12-30 LAB — TYPE AND SCREEN
ABO/RH(D): O POS
Antibody Screen: NEGATIVE

## 2020-12-30 LAB — BASIC METABOLIC PANEL
Anion gap: 9 (ref 5–15)
BUN: 23 mg/dL (ref 8–23)
CO2: 25 mmol/L (ref 22–32)
Calcium: 9.1 mg/dL (ref 8.9–10.3)
Chloride: 106 mmol/L (ref 98–111)
Creatinine, Ser: 1.22 mg/dL (ref 0.61–1.24)
GFR, Estimated: >60 mL/min (ref >60)
Glucose, Bld: 168 mg/dL — ABNORMAL HIGH (ref 70–99)
Potassium: 3.4 mmol/L — ABNORMAL LOW (ref 3.5–5.1)
Sodium: 140 mmol/L (ref 135–145)

## 2020-12-30 LAB — SAMPLE TO BLOOD BANK

## 2020-12-30 SURGERY — NEPHROLITHOTOMY PERCUTANEOUS
Anesthesia: General | Laterality: Left

## 2020-12-30 MED ORDER — PHENYLEPHRINE 40 MCG/ML (10ML) SYRINGE FOR IV PUSH (FOR BLOOD PRESSURE SUPPORT)
PREFILLED_SYRINGE | INTRAVENOUS | Status: DC | PRN
  Administered 2020-12-30 (×3): 80 ug via INTRAVENOUS

## 2020-12-30 MED ORDER — AMISULPRIDE (ANTIEMETIC) 5 MG/2ML IV SOLN
INTRAVENOUS | Status: AC
  Administered 2020-12-30: 10 mg via INTRAVENOUS
  Filled 2020-12-30: qty 4

## 2020-12-30 MED ORDER — ROCURONIUM BROMIDE 10 MG/ML (PF) SYRINGE
PREFILLED_SYRINGE | INTRAVENOUS | Status: AC
  Filled 2020-12-30: qty 10

## 2020-12-30 MED ORDER — MORPHINE SULFATE (PF) 2 MG/ML IV SOLN
2.0000 mg | INTRAVENOUS | Status: DC | PRN
  Administered 2020-12-30: 2 mg via INTRAVENOUS
  Administered 2020-12-30 – 2020-12-31 (×3): 4 mg via INTRAVENOUS
  Filled 2020-12-30: qty 2
  Filled 2020-12-30: qty 1
  Filled 2020-12-30 (×2): qty 2

## 2020-12-30 MED ORDER — MIDAZOLAM HCL 5 MG/5ML IJ SOLN
INTRAMUSCULAR | Status: DC | PRN
  Administered 2020-12-30: 2 mg via INTRAVENOUS

## 2020-12-30 MED ORDER — SODIUM CHLORIDE 0.9 % IR SOLN
Status: DC | PRN
  Administered 2020-12-30: 36000 mL

## 2020-12-30 MED ORDER — 0.9 % SODIUM CHLORIDE (POUR BTL) OPTIME
TOPICAL | Status: DC | PRN
  Administered 2020-12-30: 1000 mL

## 2020-12-30 MED ORDER — DIPHENHYDRAMINE HCL 50 MG/ML IJ SOLN: 12.5000 mg | Freq: Four times a day (QID) | INTRAMUSCULAR | Status: DC | PRN

## 2020-12-30 MED ORDER — DEXAMETHASONE SODIUM PHOSPHATE 10 MG/ML IJ SOLN
INTRAMUSCULAR | Status: AC
  Filled 2020-12-30: qty 1

## 2020-12-30 MED ORDER — ONDANSETRON HCL 4 MG/2ML IJ SOLN
4.0000 mg | INTRAMUSCULAR | Status: DC | PRN
  Administered 2020-12-30 (×2): 4 mg via INTRAVENOUS
  Filled 2020-12-30 (×2): qty 2

## 2020-12-30 MED ORDER — SUGAMMADEX SODIUM 500 MG/5ML IV SOLN
INTRAVENOUS | Status: DC | PRN
  Administered 2020-12-30: 260 mg via INTRAVENOUS

## 2020-12-30 MED ORDER — ONDANSETRON HCL 4 MG/2ML IJ SOLN
INTRAMUSCULAR | Status: AC
  Filled 2020-12-30: qty 2

## 2020-12-30 MED ORDER — ORAL CARE MOUTH RINSE: 15.0000 mL | Freq: Once | OROMUCOSAL | Status: AC

## 2020-12-30 MED ORDER — BUPIVACAINE-EPINEPHRINE 0.5% -1:200000 IJ SOLN
INTRAMUSCULAR | Status: DC | PRN
  Administered 2020-12-30: 39 mL

## 2020-12-30 MED ORDER — CEFAZOLIN SODIUM-DEXTROSE 2-4 GM/100ML-% IV SOLN
INTRAVENOUS | Status: AC
  Filled 2020-12-30: qty 100

## 2020-12-30 MED ORDER — LACTATED RINGERS IV SOLN: INTRAVENOUS | Status: DC

## 2020-12-30 MED ORDER — HYDROCHLOROTHIAZIDE 12.5 MG PO CAPS: 12.5000 mg | ORAL_CAPSULE | Freq: Every day | ORAL | Status: DC

## 2020-12-30 MED ORDER — CEFAZOLIN SODIUM-DEXTROSE 2-4 GM/100ML-% IV SOLN
2.0000 g | Freq: Three times a day (TID) | INTRAVENOUS | Status: AC
  Administered 2020-12-30 (×2): 2 g via INTRAVENOUS
  Filled 2020-12-30 (×2): qty 100

## 2020-12-30 MED ORDER — CHLORHEXIDINE GLUCONATE CLOTH 2 % EX PADS
6.0000 | MEDICATED_PAD | Freq: Every day | CUTANEOUS | Status: DC
  Administered 2020-12-30 – 2020-12-31 (×2): 6 via TOPICAL

## 2020-12-30 MED ORDER — BUPIVACAINE-EPINEPHRINE 0.5% -1:200000 IJ SOLN
INTRAMUSCULAR | Status: AC
  Filled 2020-12-30: qty 1

## 2020-12-30 MED ORDER — CEFAZOLIN SODIUM-DEXTROSE 2-4 GM/100ML-% IV SOLN
2.0000 g | INTRAVENOUS | Status: AC
  Administered 2020-12-30: 1 g via INTRAVENOUS
  Administered 2020-12-30: 2 g via INTRAVENOUS
  Filled 2020-12-30: qty 100

## 2020-12-30 MED ORDER — FLUTICASONE PROPIONATE 50 MCG/ACT NA SUSP
2.0000 | Freq: Every day | NASAL | Status: DC
  Administered 2020-12-31: 11:00:00 2 via NASAL
  Filled 2020-12-30: qty 16

## 2020-12-30 MED ORDER — ONDANSETRON HCL 4 MG/2ML IJ SOLN
INTRAMUSCULAR | Status: DC | PRN
  Administered 2020-12-30: 4 mg via INTRAVENOUS

## 2020-12-30 MED ORDER — FENTANYL CITRATE (PF) 100 MCG/2ML IJ SOLN
INTRAMUSCULAR | Status: DC | PRN
  Administered 2020-12-30 (×2): 50 ug via INTRAVENOUS
  Administered 2020-12-30: 100 ug via INTRAVENOUS
  Administered 2020-12-30: 50 ug via INTRAVENOUS

## 2020-12-30 MED ORDER — ROCURONIUM BROMIDE 10 MG/ML (PF) SYRINGE
PREFILLED_SYRINGE | INTRAVENOUS | Status: DC | PRN
  Administered 2020-12-30: 60 mg via INTRAVENOUS
  Administered 2020-12-30: 20 mg via INTRAVENOUS
  Administered 2020-12-30: 30 mg via INTRAVENOUS
  Administered 2020-12-30: 40 mg via INTRAVENOUS

## 2020-12-30 MED ORDER — ACETAMINOPHEN 500 MG PO TABS
1000.0000 mg | ORAL_TABLET | Freq: Four times a day (QID) | ORAL | Status: AC
  Administered 2020-12-30 – 2020-12-31 (×4): 1000 mg via ORAL
  Filled 2020-12-30 (×4): qty 2

## 2020-12-30 MED ORDER — OXYCODONE HCL 5 MG PO TABS
ORAL_TABLET | ORAL | Status: AC
  Filled 2020-12-30: qty 1

## 2020-12-30 MED ORDER — DEXTROSE-NACL 5-0.45 % IV SOLN: INTRAVENOUS | Status: DC

## 2020-12-30 MED ORDER — LORATADINE 10 MG PO TABS
10.0000 mg | ORAL_TABLET | Freq: Every day | ORAL | Status: DC
  Administered 2020-12-31: 11:00:00 10 mg via ORAL
  Filled 2020-12-30: qty 1

## 2020-12-30 MED ORDER — ASPIRIN EC 81 MG PO TBEC
81.0000 mg | DELAYED_RELEASE_TABLET | Freq: Every day | ORAL | Status: DC
  Administered 2020-12-30 – 2020-12-31 (×2): 81 mg via ORAL
  Filled 2020-12-30 (×2): qty 1

## 2020-12-30 MED ORDER — PROMETHAZINE HCL 25 MG/ML IJ SOLN
6.2500 mg | INTRAMUSCULAR | Status: DC | PRN
Start: 2020-12-30 — End: 2020-12-30

## 2020-12-30 MED ORDER — DIPHENHYDRAMINE HCL 12.5 MG/5ML PO ELIX: 12.5000 mg | ORAL_SOLUTION | Freq: Four times a day (QID) | ORAL | Status: DC | PRN

## 2020-12-30 MED ORDER — ACETAMINOPHEN 10 MG/ML IV SOLN
INTRAVENOUS | Status: AC
  Filled 2020-12-30: qty 100

## 2020-12-30 MED ORDER — ACETAMINOPHEN 160 MG/5ML PO SOLN: 325.0000 mg | Freq: Once | ORAL | Status: DC | PRN

## 2020-12-30 MED ORDER — HYDRALAZINE HCL 20 MG/ML IJ SOLN
5.0000 mg | Freq: Once | INTRAMUSCULAR | Status: AC
  Administered 2020-12-30: 5 mg via INTRAVENOUS

## 2020-12-30 MED ORDER — HYDROCHLOROTHIAZIDE 12.5 MG PO TABS
12.5000 mg | ORAL_TABLET | Freq: Every day | ORAL | Status: DC
  Administered 2020-12-30 – 2020-12-31 (×2): 12.5 mg via ORAL
  Filled 2020-12-30 (×2): qty 1

## 2020-12-30 MED ORDER — MEPERIDINE HCL 50 MG/ML IJ SOLN: 6.2500 mg | INTRAMUSCULAR | Status: DC | PRN

## 2020-12-30 MED ORDER — FENTANYL CITRATE (PF) 250 MCG/5ML IJ SOLN
INTRAMUSCULAR | Status: AC
  Filled 2020-12-30: qty 5

## 2020-12-30 MED ORDER — PROPOFOL 10 MG/ML IV BOLUS
INTRAVENOUS | Status: AC
  Filled 2020-12-30: qty 20

## 2020-12-30 MED ORDER — HYDROMORPHONE HCL 1 MG/ML IJ SOLN
0.2500 mg | INTRAMUSCULAR | Status: DC | PRN
Start: 2020-12-30 — End: 2020-12-30
  Administered 2020-12-30 (×3): 0.5 mg via INTRAVENOUS

## 2020-12-30 MED ORDER — OXYCODONE HCL 5 MG PO TABS
5.0000 mg | ORAL_TABLET | ORAL | Status: DC | PRN
  Administered 2020-12-30 – 2020-12-31 (×5): 5 mg via ORAL
  Filled 2020-12-30 (×5): qty 1

## 2020-12-30 MED ORDER — OXYCODONE HCL 5 MG PO TABS
5.0000 mg | ORAL_TABLET | Freq: Once | ORAL | Status: AC | PRN
  Administered 2020-12-30: 5 mg via ORAL

## 2020-12-30 MED ORDER — SODIUM CHLORIDE 0.9 % IV SOLN: 250.0000 mL | INTRAVENOUS | Status: DC | PRN

## 2020-12-30 MED ORDER — HYDRALAZINE HCL 20 MG/ML IJ SOLN
INTRAMUSCULAR | Status: AC
  Filled 2020-12-30: qty 1

## 2020-12-30 MED ORDER — HYDROMORPHONE HCL 1 MG/ML IJ SOLN
INTRAMUSCULAR | Status: AC
  Administered 2020-12-30: 0.25 mg via INTRAVENOUS
  Filled 2020-12-30: qty 1

## 2020-12-30 MED ORDER — SODIUM CHLORIDE 0.9% FLUSH: 3.0000 mL | Freq: Two times a day (BID) | INTRAVENOUS | Status: DC

## 2020-12-30 MED ORDER — ACETAMINOPHEN 10 MG/ML IV SOLN
1000.0000 mg | Freq: Once | INTRAVENOUS | Status: DC | PRN
  Administered 2020-12-30: 1000 mg via INTRAVENOUS

## 2020-12-30 MED ORDER — CHLORHEXIDINE GLUCONATE 0.12 % MT SOLN
15.0000 mL | Freq: Once | OROMUCOSAL | Status: AC
  Administered 2020-12-30: 15 mL via OROMUCOSAL

## 2020-12-30 MED ORDER — SUGAMMADEX SODIUM 500 MG/5ML IV SOLN
INTRAVENOUS | Status: AC
  Filled 2020-12-30: qty 5

## 2020-12-30 MED ORDER — SODIUM CHLORIDE 0.9% FLUSH: 3.0000 mL | INTRAVENOUS | Status: DC | PRN

## 2020-12-30 MED ORDER — BACITRACIN-NEOMYCIN-POLYMYXIN 400-5-5000 EX OINT: 1.0000 "application " | TOPICAL_OINTMENT | Freq: Three times a day (TID) | CUTANEOUS | Status: DC | PRN

## 2020-12-30 MED ORDER — AMISULPRIDE (ANTIEMETIC) 5 MG/2ML IV SOLN: 10.0000 mg | Freq: Once | INTRAVENOUS | Status: AC | PRN

## 2020-12-30 MED ORDER — LOSARTAN POTASSIUM 25 MG PO TABS
25.0000 mg | ORAL_TABLET | Freq: Every day | ORAL | Status: DC
  Administered 2020-12-30 – 2020-12-31 (×2): 25 mg via ORAL
  Filled 2020-12-30 (×2): qty 1

## 2020-12-30 MED ORDER — SENNOSIDES-DOCUSATE SODIUM 8.6-50 MG PO TABS
2.0000 | ORAL_TABLET | Freq: Every day | ORAL | Status: DC
  Administered 2020-12-30: 2 via ORAL
  Filled 2020-12-30: qty 2

## 2020-12-30 MED ORDER — IOHEXOL 300 MG/ML  SOLN
INTRAMUSCULAR | Status: DC | PRN
  Administered 2020-12-30: 90 mL

## 2020-12-30 MED ORDER — DEXAMETHASONE SODIUM PHOSPHATE 10 MG/ML IJ SOLN
INTRAMUSCULAR | Status: DC | PRN
  Administered 2020-12-30: 10 mg via INTRAVENOUS

## 2020-12-30 MED ORDER — ROCURONIUM BROMIDE 10 MG/ML (PF) SYRINGE
PREFILLED_SYRINGE | INTRAVENOUS | Status: AC
  Filled 2020-12-30: qty 30

## 2020-12-30 MED ORDER — MIDAZOLAM HCL 2 MG/2ML IJ SOLN
INTRAMUSCULAR | Status: AC
  Filled 2020-12-30: qty 2

## 2020-12-30 MED ORDER — LOSARTAN POTASSIUM 25 MG PO TABS: 25.0000 mg | ORAL_TABLET | Freq: Every day | ORAL | Status: DC

## 2020-12-30 MED ORDER — ACETAMINOPHEN 325 MG PO TABS: 325.0000 mg | ORAL_TABLET | Freq: Once | ORAL | Status: DC | PRN

## 2020-12-30 MED ORDER — CEFAZOLIN IN SODIUM CHLORIDE 3-0.9 GM/100ML-% IV SOLN
3.0000 g | Freq: Three times a day (TID) | INTRAVENOUS | Status: DC
  Filled 2020-12-30 (×2): qty 100

## 2020-12-30 MED ORDER — EPHEDRINE SULFATE-NACL 50-0.9 MG/10ML-% IV SOSY
PREFILLED_SYRINGE | INTRAVENOUS | Status: DC | PRN
  Administered 2020-12-30 (×2): 10 mg via INTRAVENOUS

## 2020-12-30 SURGICAL SUPPLY — 76 items
ADAPTER GOLDBERG URETERAL (ADAPTER) IMPLANT
ADPR CATH 15X14FR FL DRN BG (ADAPTER)
APL PRP STRL LF DISP 70% ISPRP (MISCELLANEOUS) ×1
APL SKNCLS STERI-STRIP NONHPOA (GAUZE/BANDAGES/DRESSINGS) ×1
BAG COUNTER SPONGE SURGICOUNT (BAG) IMPLANT
BAG DRN RND TRDRP ANRFLXCHMBR (UROLOGICAL SUPPLIES)
BAG SPNG CNTER NS LX DISP (BAG)
BAG SURGICOUNT SPONGE COUNTING (BAG)
BAG URINE DRAIN 2000ML AR STRL (UROLOGICAL SUPPLIES) IMPLANT
BASKET LASER NITINOL 1.9FR (BASKET) IMPLANT
BASKET STONE NITINOL 3FRX115MB (UROLOGICAL SUPPLIES) IMPLANT
BASKET ZERO TIP NITINOL 2.4FR (BASKET) ×2 IMPLANT
BENZOIN TINCTURE PRP APPL 2/3 (GAUZE/BANDAGES/DRESSINGS) ×3 IMPLANT
BLADE SURG 15 STRL LF DISP TIS (BLADE) ×1 IMPLANT
BLADE SURG 15 STRL SS (BLADE) ×3
BOOTIES KNEE HIGH SLOAN (MISCELLANEOUS) ×7 IMPLANT
BSKT STON RTRVL 120 1.9FR (BASKET)
BSKT STON RTRVL ZERO TP 2.4FR (BASKET) ×1
CATH 2WAY 30CC 24FR (CATHETERS) ×2 IMPLANT
CATH FOLEY 2W COUNCIL 20FR 5CC (CATHETERS) IMPLANT
CATH FOLEY 2WAY SLVR  5CC 18FR (CATHETERS)
CATH FOLEY 2WAY SLVR 5CC 18FR (CATHETERS) IMPLANT
CATH ROBINSON RED A/P 20FR (CATHETERS) IMPLANT
CATH URETERAL DUAL LUMEN 10F (MISCELLANEOUS) ×3 IMPLANT
CATH URETL OPEN 5X70 (CATHETERS) ×2 IMPLANT
CATH UROLOGY TORQUE 40 (MISCELLANEOUS) IMPLANT
CATH UROLOGY TORQUE 65 (CATHETERS) ×2 IMPLANT
CATH X-FORCE N30 NEPHROSTOMY (TUBING) ×3 IMPLANT
CHLORAPREP W/TINT 26 (MISCELLANEOUS) ×3 IMPLANT
COVER SURGICAL LIGHT HANDLE (MISCELLANEOUS) IMPLANT
DRAPE C-ARM 42X120 X-RAY (DRAPES) ×3 IMPLANT
DRAPE LINGEMAN PERC (DRAPES) ×3 IMPLANT
DRAPE SURG IRRIG POUCH 19X23 (DRAPES) ×5 IMPLANT
DRSG PAD ABDOMINAL 8X10 ST (GAUZE/BANDAGES/DRESSINGS) IMPLANT
DRSG TEGADERM 6X8 (GAUZE/BANDAGES/DRESSINGS) ×2 IMPLANT
DRSG TEGADERM 8X12 (GAUZE/BANDAGES/DRESSINGS) IMPLANT
GAUZE SPONGE 4X4 12PLY STRL (GAUZE/BANDAGES/DRESSINGS) ×2 IMPLANT
GLOVE SURG ENC MOIS LTX SZ6.5 (GLOVE) ×3 IMPLANT
GLOVE SURG ENC TEXT LTX SZ7.5 (GLOVE) ×6 IMPLANT
GOWN STRL REUS W/TWL LRG LVL3 (GOWN DISPOSABLE) ×3 IMPLANT
GOWN STRL REUS W/TWL XL LVL3 (GOWN DISPOSABLE) ×4 IMPLANT
GUIDEWIRE AMPLAZ .035X145 (WIRE) ×5 IMPLANT
GUIDEWIRE STR DUAL SENSOR (WIRE) ×5 IMPLANT
HLDR NDL AMPLATZ W/INSERTS (MISCELLANEOUS) IMPLANT
HOLDER NEEDLE AMPLATZ W/INSERT (MISCELLANEOUS) ×3 IMPLANT
IV SET EXTENSION CATH 6 NF (IV SETS) ×2 IMPLANT
KIT BASIN OR (CUSTOM PROCEDURE TRAY) ×3 IMPLANT
KIT PROBE 340X3.4XDISP GRN (MISCELLANEOUS) IMPLANT
KIT PROBE TRILOGY 3.4X340 (MISCELLANEOUS)
KIT PROBE TRILOGY 3.9X350 (MISCELLANEOUS) IMPLANT
KIT TURNOVER KIT A (KITS) IMPLANT
LASER FIB FLEXIVA PULSE ID 365 (Laser) IMPLANT
MANIFOLD NEPTUNE II (INSTRUMENTS) ×3 IMPLANT
NDL SPNL 20GX3.5 QUINCKE YW (NEEDLE) IMPLANT
NDL TROCAR 18X20 (NEEDLE) IMPLANT
NEEDLE SPNL 20GX3.5 QUINCKE YW (NEEDLE) ×3 IMPLANT
NEEDLE TROCAR 18X20 (NEEDLE) ×3 IMPLANT
NS IRRIG 1000ML POUR BTL (IV SOLUTION) ×3 IMPLANT
PACK CYSTO (CUSTOM PROCEDURE TRAY) ×3 IMPLANT
SPONGE T-LAP 4X18 ~~LOC~~+RFID (SPONGE) ×3 IMPLANT
STENT URET 6FRX28 CONTOUR (STENTS) ×2 IMPLANT
SUT ETHILON 3 0 PS 1 (SUTURE) ×2 IMPLANT
SUT SILK 0 (SUTURE) ×3
SUT SILK 0 30XBRD TIE 6 (SUTURE) IMPLANT
SUT SILK 2 0 30  PSL (SUTURE)
SUT SILK 2 0 30 PSL (SUTURE) IMPLANT
SYR 20ML LL LF (SYRINGE) ×3 IMPLANT
SYR 50ML LL SCALE MARK (SYRINGE) ×2 IMPLANT
TOWEL OR 17X26 10 PK STRL BLUE (TOWEL DISPOSABLE) ×3 IMPLANT
TRACTIP FLEXIVA PULS ID 200XHI (Laser) IMPLANT
TRACTIP FLEXIVA PULSE ID 200 (Laser)
TRAY FOLEY MTR SLVR 16FR STAT (SET/KITS/TRAYS/PACK) ×3 IMPLANT
TUBING CONNECTING 10 (TUBING) ×4 IMPLANT
TUBING CONNECTING 10' (TUBING) ×2
TUBING STONE CATCHER TRILOGY (MISCELLANEOUS) IMPLANT
TUBING UROLOGY SET (TUBING) ×3 IMPLANT

## 2020-12-30 NOTE — Plan of Care (Signed)
  Problem: Education: Goal: Knowledge of General Education information will improve Description: Including pain rating scale, medication(s)/side effects and non-pharmacologic comfort measures Outcome: Progressing   Problem: Activity: Goal: Risk for activity intolerance will decrease Outcome: Progressing   Problem: Pain Managment: Goal: General experience of comfort will improve Outcome: Progressing   

## 2020-12-30 NOTE — Progress Notes (Signed)
Pt ambulated 360 feet in halls and tolerated well. SCDs applied to pt once pt back in bed. Pt used incentive spirometer 10x and tolerated well.

## 2020-12-30 NOTE — Transfer of Care (Signed)
Immediate Anesthesia Transfer of Care Note  Patient: Adam Avila  Procedure(s) Performed: LEFT NEPHROLITHOTOMY PERCUTANEOUS WTH  SURGEON ACCESS (Left)  Patient Location: PACU  Anesthesia Type:General  Level of Consciousness: awake, alert  and oriented  Airway & Oxygen Therapy: Patient Spontanous Breathing and Patient connected to face mask oxygen  Post-op Assessment: Report given to RN, Post -op Vital signs reviewed and stable and Patient moving all extremities X 4  Post vital signs: Reviewed and stable  Last Vitals:  Vitals Value Taken Time  BP 162/117 12/30/20 1220  Temp    Pulse 84   Resp 20 12/30/20 1221  SpO2 100   Vitals shown include unvalidated device data.  Last Pain:  Vitals:   12/30/20 0649  TempSrc:   PainSc: 0-No pain      Patients Stated Pain Goal: 4 (50/51/83 3582)  Complications: No notable events documented.

## 2020-12-30 NOTE — Anesthesia Preprocedure Evaluation (Addendum)
Anesthesia Evaluation  Patient identified by MRN, date of birth, ID band Patient awake    Reviewed: Allergy & Precautions, NPO status , Patient's Chart, lab work & pertinent test results  Airway Mallampati: I  TM Distance: >3 FB Neck ROM: Full    Dental  (+) Teeth Intact, Dental Advisory Given   Pulmonary sleep apnea ,    breath sounds clear to auscultation       Cardiovascular hypertension, Pt. on medications  Rhythm:Regular Rate:Normal     Neuro/Psych PSYCHIATRIC DISORDERS  Neuromuscular disease    GI/Hepatic negative GI ROS, Neg liver ROS,   Endo/Other  negative endocrine ROS  Renal/GU negative Renal ROS     Musculoskeletal  (+) Arthritis ,   Abdominal (+) + obese,   Peds  Hematology negative hematology ROS (+)   Anesthesia Other Findings   Reproductive/Obstetrics                            Anesthesia Physical Anesthesia Plan  ASA: 3  Anesthesia Plan: General   Post-op Pain Management:    Induction: Intravenous  PONV Risk Score and Plan: 3 and Ondansetron, Dexamethasone and Midazolam  Airway Management Planned: Oral ETT  Additional Equipment: None  Intra-op Plan:   Post-operative Plan: Extubation in OR  Informed Consent: I have reviewed the patients History and Physical, chart, labs and discussed the procedure including the risks, benefits and alternatives for the proposed anesthesia with the patient or authorized representative who has indicated his/her understanding and acceptance.     Dental advisory given  Plan Discussed with: CRNA  Anesthesia Plan Comments: (Lab Results      Component                Value               Date                      WBC                      7.8                 12/18/2020                HGB                      14.9                12/18/2020                HCT                      42.7                12/18/2020                MCV                       90.9                12/18/2020                PLT                      257  12/18/2020           )      Anesthesia Quick Evaluation

## 2020-12-30 NOTE — Discharge Instructions (Signed)

## 2020-12-30 NOTE — Anesthesia Procedure Notes (Signed)
Procedure Name: Intubation Date/Time: 12/30/2020 8:41 AM Performed by: Gerald Leitz, CRNA Pre-anesthesia Checklist: Patient identified, Patient being monitored, Timeout performed, Emergency Drugs available and Suction available Patient Re-evaluated:Patient Re-evaluated prior to induction Oxygen Delivery Method: Circle system utilized Preoxygenation: Pre-oxygenation with 100% oxygen Induction Type: IV induction Ventilation: Mask ventilation without difficulty Laryngoscope Size: Mac and 3 Grade View: Grade I Tube type: Oral Tube size: 7.5 mm Number of attempts: 1 Placement Confirmation: ETT inserted through vocal cords under direct vision, positive ETCO2 and breath sounds checked- equal and bilateral Secured at: 22 cm Tube secured with: Tape Dental Injury: Teeth and Oropharynx as per pre-operative assessment

## 2020-12-30 NOTE — OR Nursing (Signed)
Stone taken by Dr. Pace 

## 2020-12-30 NOTE — Op Note (Signed)
Pre-operative diagnosis: left renal pelvis stones, >3 cm Post-operative diagnosis: as above   Procedure performed: cystoscopy, left retrograde pyelogram with interpretation, left percutaneous renal access, left nephrolithotomy, left nephrostogram,  6Fr x 28cm Left ureteral stent placement    Surgeon: Dr. Jacalyn Lefevre Assistant: Dr. Louis Meckel; due to nature of the case and complexity of retograde access an assistant was necessary  Anesthesia: General  Complications: None  Specimens: The majority of the stones were removed and will be sent to the [facility] for further analysis.  Findings: 1. Large renal pelvis stone 2. Small lower pole calculi  3. 6Fr x 28cm left JJ ureteral stent 4. 16Fr foley catheter   EBL: Approximately 100 cc  Specimens: stone from collecting system - taken to [facility] lab  Indication: Adam Avila is a 60 yo man with a history of nephrolithiasis with a large stone burden in the left kidney. After reviewing the management options for treatment, he elected to proceed with the above surgical procedure(s). We have discussed the potential benefits and risks of the procedure, side effects of the proposed treatment, the likelihood of the patient achieving the goals of the procedure, and any potential problems that might occur during the procedure or recuperation. Informed consent has been obtained.   Description:  Consent was obtained in the preoperative holding area. The patient was marked appropriately and then taken back to the operating room where he was intubated on the gurney. The patient was flipped prone onto the split leg OR table. Large jelly rolls were placed in the anterior axillary line on both sides allowing the patient's chest and abdomen to fall inbetween. The patient was then prepped and draped in the routine sterile fashion in the left flank and genital area. A timeout was then held confirming the proper side and procedure as  well as antibiotics were administered.  I then used the flexible cystoscope and passed gently into the patient's urethra under visual guidance. Once into the bladder the left ureteral orifice was identified. I then passed a wire through the left UO and up into the left collecting system. I The wire was then exchanged for a 5 [place] open-ended ureteral Pollock catheter. The Pollack catheter was then advanced up to the UPJ. The wire was then removed and a retrograde pyelogram was performed with the above findings. I then turned my attention to the patient's left flank and obtaining percutaneous renal access.  I then used the flexible cystoscope and passed gently into the patient's urethra under visual guidance. Once into the bladder I grasped the stent emanating from the patient's left ureteral orifice and pull it to the urethral meatus. I then passed a wire through the stent and up into the left collecting system. I then removed the stent over the wire and exchanged for a 5 [place] open-ended ureteral Pollock catheter. The Pollack catheter was then advanced up to the UPJ. The wire was then removed and a retrograde pyelogram was performed with the above findings. I then turned my attention to the patient's left flank and obtaining percutaneous renal access.  Using the C-arm rotated at 25 and the bulls-eye technique with an 18-gauge coaxial needle the  lower posterior lateral calyx was targeted. Then rotating the C-arm AP depth of our needle was noted to be within the calyx and the inner part of the coaxial needle was removed. Urine was noted to return. A 0.038 sensor wire was then passed through the sheath of the coaxial needle and into  the left renal collecting system.  Multiple attempts at advancing the wire to the renal pelvis and the ureter were unsuccessful due to obstruction from the stone.  The decision was made to access a calyx just superior to the lower pole. The same technique was used. The wire was  then passed down the ureter and into the bladder using fluoroscopic guide and the sheath of the needle was removed.  An angiographic catheter was then advanced into the bladder and the wire removed.  A Super Stiff wire was then passed into the angiographic catheter and the angiographic catheter removed.  A dual-lumen catheter was then advanced over the wire and a second Super Stiff wire placed into the patient's bladder under fluoroscopic guidance, establishing 2 superstiff wires through the targeted calyx and into the bladder.   The 82 French NephroMax balloon was then passed over one of the Super Stiff wires and the tip guided down into the targeted calyx. The balloon was then inflated to approximately 12 atm, and once there was no waist noted under fluoroscopy the access sheath was advanced over the balloon. The balloon was then removed. The wires were then placed back into the sheaths and snapped to the drape.   Using the rigid nephroscope to explore the targeted calyx and kidney.  The stones were then fragmented and removed using the triology with combination of pneumatic and ultrasound. Then using a flexible cystoscope to navigate the remaining calyces of the kidney multiple smaller stone fragments encountered.  Using the 0 tip basket these fragments were grabbed and removed. There was no more visible stones were unsure if lower pole was fully inspected.    Once the UPJ was clear the scope was advanced into the bladder and a 0.038 sensor wire was left in the bladder and the scope backed out over wire. The sensor wire was then backloaded over the rigid nephroscope using the stent pusher and a  28 cm x 6 French double-J ureteral stent was passed antegrade over the sensor wire down into the bladder under fluoroscopic guidance. Once the stent was in the bladder the wire was gently pulled back and a nice curl noted in the bladder. The wire completely removed from the stent, and nice curl on the proximal end  of the stent was noted in the renal pelvis. The sheath was then backed out slowly to ensure that all calyces had been inspected and there was nothing behind the sheath.   A 85F ainsworth tip catheter was then passed over one of the Super Stiff wires through the sheath and into the renal pelvis. The sheath was then backed out of the kidney and cut over the red rubber catheter. A nephrostogram was then performed confirming the position of our nephrostomy tube and reassuring that there were no longer any filling defects from the patient's symptoms.  After several minutes of direct pressure and observation was noted that there was no significant bleeding from the nephrostomy tube or around the nephrostomy tube tract. As such, I remove the nephrostomy tube as well as the safety wire. 25 cc of local anesthesia was then injected into the patient's wound, and the wound was closed with 3-0 nylon in 2 vertical mattress sutures. The incision was then padded using a bundle of 4 x 4's and Hypafix tape. Patient was subsequently rolled over to the supine position and extubated. The patient was returned to the PACU in excellent condition. At the end of the case all lap and needle and  sponges were accounted for. There are no perioperative complications.

## 2020-12-30 NOTE — Interval H&P Note (Signed)
History and Physical Interval Note:  12/30/2020 8:12 AM  Adam Avila  has presented today for surgery, with the diagnosis of RENAL STONE.  The various methods of treatment have been discussed with the patient and family. After consideration of risks, benefits and other options for treatment, the patient has consented to  Procedure(s): LEFT NEPHROLITHOTOMY PERCUTANEOUS [facility]  SURGEON ACCESS (Left) as a surgical intervention.  The patient's history has been reviewed, patient examined, no change in status, stable for surgery.  I have reviewed the patient's chart and labs.  Questions were answered to the patient's satisfaction.     Ellina Sivertsen D Jayvien Rowlette

## 2020-12-31 ENCOUNTER — Other Ambulatory Visit (HOSPITAL_COMMUNITY): Payer: Self-pay

## 2020-12-31 ENCOUNTER — Observation Stay (HOSPITAL_COMMUNITY): Payer: 59

## 2020-12-31 ENCOUNTER — Encounter (HOSPITAL_COMMUNITY): Payer: Self-pay | Admitting: Urology

## 2020-12-31 DIAGNOSIS — Z466 Encounter for fitting and adjustment of urinary device: Secondary | ICD-10-CM | POA: Diagnosis not present

## 2020-12-31 DIAGNOSIS — N200 Calculus of kidney: Secondary | ICD-10-CM | POA: Diagnosis not present

## 2020-12-31 DIAGNOSIS — Z87442 Personal history of urinary calculi: Secondary | ICD-10-CM | POA: Diagnosis not present

## 2020-12-31 DIAGNOSIS — K4020 Bilateral inguinal hernia, without obstruction or gangrene, not specified as recurrent: Secondary | ICD-10-CM | POA: Diagnosis not present

## 2020-12-31 DIAGNOSIS — K760 Fatty (change of) liver, not elsewhere classified: Secondary | ICD-10-CM | POA: Diagnosis not present

## 2020-12-31 DIAGNOSIS — N132 Hydronephrosis with renal and ureteral calculous obstruction: Secondary | ICD-10-CM | POA: Diagnosis not present

## 2020-12-31 LAB — BASIC METABOLIC PANEL
Anion gap: 8 (ref 5–15)
BUN: 21 mg/dL (ref 8–23)
CO2: 26 mmol/L (ref 22–32)
Calcium: 8.8 mg/dL — ABNORMAL LOW (ref 8.9–10.3)
Chloride: 102 mmol/L (ref 98–111)
Creatinine, Ser: 1.27 mg/dL — ABNORMAL HIGH (ref 0.61–1.24)
GFR, Estimated: >60 mL/min (ref >60)
Glucose, Bld: 126 mg/dL — ABNORMAL HIGH (ref 70–99)
Potassium: 3.9 mmol/L (ref 3.5–5.1)
Sodium: 136 mmol/L (ref 135–145)

## 2020-12-31 LAB — CBC
HCT: 42.6 % (ref 39.0–52.0)
Hemoglobin: 14.6 g/dL (ref 13.0–17.0)
MCH: 31.7 pg (ref 26.0–34.0)
MCHC: 34.3 g/dL (ref 30.0–36.0)
MCV: 92.4 fL (ref 80.0–100.0)
Platelets: 229 10*3/uL (ref 150–400)
RBC: 4.61 MIL/uL (ref 4.22–5.81)
RDW: 13.2 % (ref 11.5–15.5)
WBC: 19.3 10*3/uL — ABNORMAL HIGH (ref 4.0–10.5)
nRBC: 0 % (ref 0.0–0.2)

## 2020-12-31 MED ORDER — TAMSULOSIN HCL 0.4 MG PO CAPS
0.4000 mg | ORAL_CAPSULE | Freq: Every day | ORAL | 0 refills | Status: DC
  Filled 2020-12-31: qty 30, 30d supply, fill #0

## 2020-12-31 MED ORDER — OXYCODONE HCL 5 MG PO TABS
5.0000 mg | ORAL_TABLET | ORAL | 0 refills | Status: DC | PRN
  Filled 2020-12-31: qty 20, 4d supply, fill #0

## 2020-12-31 NOTE — Progress Notes (Signed)
Foley removed per protocol. Pt tolerated well. Education on voiding provided.

## 2020-12-31 NOTE — Discharge Summary (Signed)
Date of admission: 12/30/2020  Date of discharge: 12/31/2020  Admission diagnosis: left renal caclulus  Discharge diagnosis: same  Secondary diagnoses:  Patient Active Problem List   Diagnosis Date Noted   Renal calculus 12/30/2020   Primary localized osteoarthritis of left knee 12/29/2014   DJD (degenerative joint disease) of knee 12/29/2014   Tear of lateral meniscus of left knee 12/05/2013   Primary osteoarthritis of left knee 11/07/2013   Right-sided low back pain with right-sided sciatica 11/07/2013   MUSCLE STRAIN, LEFT CALF 10/07/2009   HERNIATED LUMBAR DISK WITH RADICULOPATHY 08/26/2008   ADJ DISORDER WITH MIXED ANXIETY & DEPRESSED MOOD 07/30/2008    Procedures performed: Procedure(s): LEFT NEPHROLITHOTOMY PERCUTANEOUS WTH  SURGEON ACCESS  History and Physical: For full details, please see admission history and physical. Briefly, Adam Avila is a 60 y.o. year old patient with large left renal calculus who underwent LEFT PCNL.   Hospital Course: Patient tolerated the procedure well.  He was then transferred to the floor after an uneventful PACU stay.  His hospital course was uncomplicated.  On POD#1 he had met discharge criteria: was eating a regular diet, was up and ambulating independently,  pain was well controlled, was voiding without a catheter, and was ready to for discharge.   Laboratory values:  Recent Labs    12/30/20 1514 12/31/20 0434  WBC 14.0* 19.3*  HGB 15.6 14.6  HCT 45.3 42.6   Recent Labs    12/30/20 1514 12/31/20 0434  NA 140 136  K 3.4* 3.9  CL 106 102  CO2 25 26  GLUCOSE 168* 126*  BUN 23 21  CREATININE 1.22 1.27*  CALCIUM 9.1 8.8*   No results for input(s): LABPT, INR in the last 72 hours. No results for input(s): LABURIN in the last 72 hours. Results for orders placed or performed in visit on 12/28/20  SARS Coronavirus 2 (TAT 6-24 hrs)     Status: None   Collection Time: 12/28/20 12:00 AM  Result Value Ref Range Status   SARS  Coronavirus 2 RESULT: NEGATIVE  Final    Comment: RESULT: NEGATIVESARS-CoV-2 INTERPRETATION:A NEGATIVE  test result means that SARS-CoV-2 RNA was not present in the specimen above the limit of detection of this test. This does not preclude a possible SARS-CoV-2 infection and should not be used as the  sole basis for patient management decisions. Negative results must be combined with clinical observations, patient history, and epidemiological information. Optimum specimen types and timing for peak viral levels during infections caused by SARS-CoV-2  have not been determined. Collection of multiple specimens or types of specimens may be necessary to detect virus. Improper specimen collection and handling, sequence variability under primers/probes, or organism present below the limit of detection may  lead to false negative results. Positive and negative predictive values of testing are highly dependent on prevalence. False negative test results are more likely when prevalence of disease is high.The expected result is NEGATIVE.Fact S heet for  Healthcare Providers: LocalChronicle.no Sheet for Patients: SalonLookup.es Reference Range - Negative     Disposition: Home  Discharge instruction: The patient was instructed to be ambulatory but told to refrain from heavy lifting, strenuous activity, or driving.   Discharge medications:  Allergies as of 12/31/2020   No Known Allergies      Medication List     TAKE these medications    aspirin EC 81 MG tablet Take 81 mg by mouth daily. Swallow whole.   celecoxib 200 MG capsule Commonly known as:  CeleBREX Take 1 capsule (200 mg total) by mouth once or twice daily with food What changed: when to take this   cetirizine 10 MG tablet Commonly known as: ZYRTEC Take 10 mg by mouth daily.   cyclobenzaprine 10 MG tablet Commonly known as: FLEXERIL Take 1 tablet (10 mg total) by mouth at  bedtime as needed for 20 days. What changed:  when to take this reasons to take this   fluticasone 50 MCG/ACT nasal spray Commonly known as: FLONASE Place 2 sprays into both nostrils daily.   hydrochlorothiazide 12.5 MG capsule Commonly known as: MICROZIDE Take 1 capsule (12.5 mg total) by mouth daily as needed. What changed: when to take this   losartan 100 MG tablet Commonly known as: COZAAR TAKE 1 TABLET BY MOUTH ONCE DAILY.   Mens 50+ Multi Vitamin/Min Tabs Take 1 tablet by mouth daily.   oxyCODONE 5 MG immediate release tablet Commonly known as: Oxy IR/ROXICODONE Take 1 tablet (5 mg total) by mouth every 4 (four) hours as needed for moderate pain.   tadalafil 5 MG tablet Commonly known as: CIALIS TAKE 1 TO 2 TABLETS BY MOUTH EVERY OTHER DAY.   tamsulosin 0.4 MG Caps capsule Commonly known as: FLOMAX Take 1 capsule (0.4 mg total) by mouth at bedtime.        Followup:   Follow-up Information     [facility] Follow up.   Contact information: [place] [phone]

## 2020-12-31 NOTE — Anesthesia Postprocedure Evaluation (Signed)
Anesthesia Post Note  Patient: Adam Avila  Procedure(s) Performed: LEFT NEPHROLITHOTOMY PERCUTANEOUS WTH  SURGEON ACCESS (Left)     Patient location during evaluation: PACU Anesthesia Type: General Level of consciousness: awake and alert Pain management: pain level controlled Vital Signs Assessment: post-procedure vital signs reviewed and stable Respiratory status: spontaneous breathing, nonlabored ventilation, respiratory function stable and patient connected to nasal cannula oxygen Cardiovascular status: blood pressure returned to baseline and stable Postop Assessment: no apparent nausea or vomiting Anesthetic complications: no   No notable events documented.               Effie Berkshire

## 2020-12-31 NOTE — Plan of Care (Signed)
  Problem: Education: Goal: Knowledge of General Education information will improve Description: Including pain rating scale, medication(s)/side effects and non-pharmacologic comfort measures 12/31/2020 1346 by Benay Pillow, RN Outcome: Adequate for Discharge 12/31/2020 1346 by Benay Pillow, RN Outcome: Adequate for Discharge   Problem: Health Behavior/Discharge Planning: Goal: Ability to manage health-related needs will improve 12/31/2020 1346 by Benay Pillow, RN Outcome: Adequate for Discharge 12/31/2020 1346 by Benay Pillow, RN Outcome: Adequate for Discharge   Problem: Clinical Measurements: Goal: Ability to maintain clinical measurements within normal limits will improve 12/31/2020 1346 by Benay Pillow, RN Outcome: Adequate for Discharge 12/31/2020 1346 by Benay Pillow, RN Outcome: Adequate for Discharge Goal: Will remain free from infection 12/31/2020 1346 by Benay Pillow, RN Outcome: Adequate for Discharge 12/31/2020 1346 by Benay Pillow, RN Outcome: Adequate for Discharge Goal: Diagnostic test results will improve 12/31/2020 1346 by Benay Pillow, RN Outcome: Adequate for Discharge 12/31/2020 1346 by Benay Pillow, RN Outcome: Adequate for Discharge Goal: Respiratory complications will improve 12/31/2020 1346 by Benay Pillow, RN Outcome: Adequate for Discharge 12/31/2020 1346 by Benay Pillow, RN Outcome: Adequate for Discharge Goal: Cardiovascular complication will be avoided 12/31/2020 1346 by Benay Pillow, RN Outcome: Adequate for Discharge 12/31/2020 1346 by Benay Pillow, RN Outcome: Adequate for Discharge   Problem: Activity: Goal: Risk for activity intolerance will decrease 12/31/2020 1346 by Benay Pillow, RN Outcome: Adequate for Discharge 12/31/2020 1346 by Benay Pillow, RN Outcome: Adequate for Discharge   Problem: Nutrition: Goal: Adequate nutrition will be  maintained 12/31/2020 1346 by Benay Pillow, RN Outcome: Adequate for Discharge 12/31/2020 1346 by Benay Pillow, RN Outcome: Adequate for Discharge   Problem: Coping: Goal: Level of anxiety will decrease 12/31/2020 1346 by Benay Pillow, RN Outcome: Adequate for Discharge 12/31/2020 1346 by Benay Pillow, RN Outcome: Adequate for Discharge   Problem: Elimination: Goal: Will not experience complications related to bowel motility 12/31/2020 1346 by Benay Pillow, RN Outcome: Adequate for Discharge 12/31/2020 1346 by Benay Pillow, RN Outcome: Adequate for Discharge Goal: Will not experience complications related to urinary retention 12/31/2020 1346 by Benay Pillow, RN Outcome: Adequate for Discharge 12/31/2020 1346 by Benay Pillow, RN Outcome: Adequate for Discharge   Problem: Pain Managment: Goal: General experience of comfort will improve 12/31/2020 1346 by Benay Pillow, RN Outcome: Adequate for Discharge 12/31/2020 1346 by Benay Pillow, RN Outcome: Adequate for Discharge   Problem: Safety: Goal: Ability to remain free from injury will improve 12/31/2020 1346 by Benay Pillow, RN Outcome: Adequate for Discharge 12/31/2020 1346 by Benay Pillow, RN Outcome: Adequate for Discharge   Problem: Skin Integrity: Goal: Risk for impaired skin integrity will decrease 12/31/2020 1346 by Benay Pillow, RN Outcome: Adequate for Discharge 12/31/2020 1346 by Benay Pillow, RN Outcome: Adequate for Discharge

## 2021-01-13 DIAGNOSIS — R8271 Bacteriuria: Secondary | ICD-10-CM | POA: Diagnosis not present

## 2021-01-13 DIAGNOSIS — N200 Calculus of kidney: Secondary | ICD-10-CM | POA: Diagnosis not present

## 2021-01-21 ENCOUNTER — Other Ambulatory Visit (HOSPITAL_COMMUNITY): Payer: Self-pay

## 2021-01-21 ENCOUNTER — Other Ambulatory Visit: Payer: Self-pay

## 2021-01-21 MED ORDER — TADALAFIL 5 MG PO TABS
5.0000 mg | ORAL_TABLET | ORAL | 1 refills | Status: DC
  Filled 2021-01-21: qty 30, 30d supply, fill #0
  Filled 2021-04-16: qty 30, 30d supply, fill #1

## 2021-01-21 MED FILL — Losartan Potassium Tab 100 MG: ORAL | 90 days supply | Qty: 90 | Fill #2 | Status: AC

## 2021-01-22 ENCOUNTER — Other Ambulatory Visit (HOSPITAL_COMMUNITY): Payer: Self-pay

## 2021-01-26 ENCOUNTER — Other Ambulatory Visit (HOSPITAL_COMMUNITY): Payer: Self-pay

## 2021-01-26 ENCOUNTER — Other Ambulatory Visit: Payer: Self-pay

## 2021-01-26 MED ORDER — TAMSULOSIN HCL 0.4 MG PO CAPS
0.4000 mg | ORAL_CAPSULE | Freq: Every day | ORAL | 0 refills | Status: DC
  Filled 2021-01-26 – 2021-02-12 (×3): qty 30, 30d supply, fill #0

## 2021-01-28 ENCOUNTER — Other Ambulatory Visit (HOSPITAL_COMMUNITY): Payer: Self-pay

## 2021-02-03 ENCOUNTER — Other Ambulatory Visit (HOSPITAL_COMMUNITY): Payer: Self-pay

## 2021-02-04 ENCOUNTER — Other Ambulatory Visit (HOSPITAL_COMMUNITY): Payer: Self-pay

## 2021-02-04 MED ORDER — TAMSULOSIN HCL 0.4 MG PO CAPS
0.4000 mg | ORAL_CAPSULE | Freq: Every day | ORAL | 0 refills | Status: DC
  Filled 2021-02-04 – 2021-03-07 (×2): qty 30, 30d supply, fill #0

## 2021-02-11 ENCOUNTER — Other Ambulatory Visit (HOSPITAL_COMMUNITY): Payer: Self-pay

## 2021-02-12 ENCOUNTER — Other Ambulatory Visit (HOSPITAL_COMMUNITY): Payer: Self-pay

## 2021-02-12 DIAGNOSIS — G4733 Obstructive sleep apnea (adult) (pediatric): Secondary | ICD-10-CM | POA: Diagnosis not present

## 2021-02-17 ENCOUNTER — Other Ambulatory Visit (HOSPITAL_COMMUNITY): Payer: Self-pay

## 2021-02-17 DIAGNOSIS — N200 Calculus of kidney: Secondary | ICD-10-CM | POA: Diagnosis not present

## 2021-02-17 MED ORDER — PEG 3350-KCL-NA BICARB-NACL 420 G PO SOLR
4000.0000 mL | Freq: Once | ORAL | 0 refills | Status: AC
  Filled 2021-02-17: qty 4000, 1d supply, fill #0

## 2021-02-25 DIAGNOSIS — Z1211 Encounter for screening for malignant neoplasm of colon: Secondary | ICD-10-CM | POA: Diagnosis not present

## 2021-02-25 DIAGNOSIS — K649 Unspecified hemorrhoids: Secondary | ICD-10-CM | POA: Diagnosis not present

## 2021-02-25 DIAGNOSIS — K5730 Diverticulosis of large intestine without perforation or abscess without bleeding: Secondary | ICD-10-CM | POA: Diagnosis not present

## 2021-03-04 DIAGNOSIS — L814 Other melanin hyperpigmentation: Secondary | ICD-10-CM | POA: Diagnosis not present

## 2021-03-04 DIAGNOSIS — L570 Actinic keratosis: Secondary | ICD-10-CM | POA: Diagnosis not present

## 2021-03-04 DIAGNOSIS — D2362 Other benign neoplasm of skin of left upper limb, including shoulder: Secondary | ICD-10-CM | POA: Diagnosis not present

## 2021-03-04 DIAGNOSIS — D225 Melanocytic nevi of trunk: Secondary | ICD-10-CM | POA: Diagnosis not present

## 2021-03-04 DIAGNOSIS — D485 Neoplasm of uncertain behavior of skin: Secondary | ICD-10-CM | POA: Diagnosis not present

## 2021-03-04 DIAGNOSIS — L821 Other seborrheic keratosis: Secondary | ICD-10-CM | POA: Diagnosis not present

## 2021-03-08 ENCOUNTER — Other Ambulatory Visit (HOSPITAL_COMMUNITY): Payer: Self-pay

## 2021-03-12 ENCOUNTER — Other Ambulatory Visit (HOSPITAL_COMMUNITY): Payer: Self-pay

## 2021-03-20 ENCOUNTER — Other Ambulatory Visit (HOSPITAL_COMMUNITY): Payer: Self-pay

## 2021-04-12 ENCOUNTER — Other Ambulatory Visit: Payer: Self-pay

## 2021-04-16 ENCOUNTER — Other Ambulatory Visit (HOSPITAL_COMMUNITY): Payer: Self-pay

## 2021-04-16 MED ORDER — TAMSULOSIN HCL 0.4 MG PO CAPS
0.4000 mg | ORAL_CAPSULE | Freq: Every day | ORAL | 0 refills | Status: DC
  Filled 2021-04-16: qty 30, 30d supply, fill #0

## 2021-04-28 DIAGNOSIS — H52223 Regular astigmatism, bilateral: Secondary | ICD-10-CM | POA: Diagnosis not present

## 2021-04-28 DIAGNOSIS — H5203 Hypermetropia, bilateral: Secondary | ICD-10-CM | POA: Diagnosis not present

## 2021-04-29 ENCOUNTER — Other Ambulatory Visit (HOSPITAL_COMMUNITY): Payer: Self-pay

## 2021-04-29 DIAGNOSIS — R059 Cough, unspecified: Secondary | ICD-10-CM | POA: Diagnosis not present

## 2021-04-29 DIAGNOSIS — Z20822 Contact with and (suspected) exposure to covid-19: Secondary | ICD-10-CM | POA: Diagnosis not present

## 2021-04-29 DIAGNOSIS — J309 Allergic rhinitis, unspecified: Secondary | ICD-10-CM | POA: Diagnosis not present

## 2021-04-29 MED ORDER — MONTELUKAST SODIUM 10 MG PO TABS
10.0000 mg | ORAL_TABLET | Freq: Every day | ORAL | 1 refills | Status: DC
  Filled 2021-04-29: qty 90, 90d supply, fill #0
  Filled 2021-07-22: qty 90, 90d supply, fill #1

## 2021-04-29 MED ORDER — AMOXICILLIN 875 MG PO TABS
875.0000 mg | ORAL_TABLET | Freq: Two times a day (BID) | ORAL | 0 refills | Status: DC
  Filled 2021-04-29: qty 14, 7d supply, fill #0

## 2021-05-02 ENCOUNTER — Other Ambulatory Visit (HOSPITAL_COMMUNITY): Payer: Self-pay

## 2021-05-03 ENCOUNTER — Other Ambulatory Visit (HOSPITAL_COMMUNITY): Payer: Self-pay

## 2021-05-03 MED ORDER — CELECOXIB 200 MG PO CAPS
200.0000 mg | ORAL_CAPSULE | Freq: Two times a day (BID) | ORAL | 1 refills | Status: DC
  Filled 2021-05-03: qty 180, 90d supply, fill #0

## 2021-05-07 ENCOUNTER — Other Ambulatory Visit (HOSPITAL_COMMUNITY): Payer: Self-pay

## 2021-05-07 DIAGNOSIS — Z13220 Encounter for screening for lipoid disorders: Secondary | ICD-10-CM | POA: Diagnosis not present

## 2021-05-07 DIAGNOSIS — M179 Osteoarthritis of knee, unspecified: Secondary | ICD-10-CM | POA: Diagnosis not present

## 2021-05-07 DIAGNOSIS — E538 Deficiency of other specified B group vitamins: Secondary | ICD-10-CM | POA: Diagnosis not present

## 2021-05-07 DIAGNOSIS — I10 Essential (primary) hypertension: Secondary | ICD-10-CM | POA: Diagnosis not present

## 2021-05-07 DIAGNOSIS — N400 Enlarged prostate without lower urinary tract symptoms: Secondary | ICD-10-CM | POA: Diagnosis not present

## 2021-05-07 DIAGNOSIS — Z125 Encounter for screening for malignant neoplasm of prostate: Secondary | ICD-10-CM | POA: Diagnosis not present

## 2021-05-07 DIAGNOSIS — N529 Male erectile dysfunction, unspecified: Secondary | ICD-10-CM | POA: Diagnosis not present

## 2021-05-07 DIAGNOSIS — E291 Testicular hypofunction: Secondary | ICD-10-CM | POA: Diagnosis not present

## 2021-05-07 DIAGNOSIS — Z0000 Encounter for general adult medical examination without abnormal findings: Secondary | ICD-10-CM | POA: Diagnosis not present

## 2021-05-07 MED ORDER — SILDENAFIL CITRATE 100 MG PO TABS
100.0000 mg | ORAL_TABLET | ORAL | 5 refills | Status: DC | PRN
  Filled 2021-05-07: qty 6, 30d supply, fill #0
  Filled 2021-05-20 – 2021-05-26 (×3): qty 6, 30d supply, fill #1
  Filled 2021-07-31: qty 6, 30d supply, fill #2
  Filled 2021-10-10: qty 6, 30d supply, fill #3
  Filled 2021-12-15: qty 6, 30d supply, fill #4
  Filled 2022-01-21: qty 6, 30d supply, fill #5
  Filled 2022-04-21: qty 6, 30d supply, fill #6

## 2021-05-07 MED ORDER — HYDROCHLOROTHIAZIDE 12.5 MG PO CAPS
12.5000 mg | ORAL_CAPSULE | Freq: Every morning | ORAL | 4 refills | Status: DC
  Filled 2021-05-07 – 2021-06-12 (×2): qty 90, 90d supply, fill #0
  Filled 2021-07-31 – 2021-10-20 (×3): qty 90, 90d supply, fill #1
  Filled 2022-01-21: qty 90, 90d supply, fill #2

## 2021-05-07 MED ORDER — LOSARTAN POTASSIUM 100 MG PO TABS
100.0000 mg | ORAL_TABLET | Freq: Every day | ORAL | 4 refills | Status: DC
  Filled 2021-05-07: qty 90, 90d supply, fill #0
  Filled 2021-05-24 – 2021-07-31 (×3): qty 90, 90d supply, fill #1
  Filled 2021-10-10: qty 90, 90d supply, fill #2
  Filled 2021-11-16 – 2022-01-21 (×2): qty 90, 90d supply, fill #3
  Filled 2022-04-21: qty 90, 90d supply, fill #4

## 2021-05-13 DIAGNOSIS — E291 Testicular hypofunction: Secondary | ICD-10-CM | POA: Diagnosis not present

## 2021-05-13 DIAGNOSIS — Z6841 Body Mass Index (BMI) 40.0 and over, adult: Secondary | ICD-10-CM | POA: Diagnosis not present

## 2021-05-17 ENCOUNTER — Other Ambulatory Visit (HOSPITAL_COMMUNITY): Payer: Self-pay

## 2021-05-17 MED ORDER — TESTOSTERONE 50 MG/5GM (1%) TD GEL
5.0000 g | Freq: Every morning | TRANSDERMAL | 3 refills | Status: DC
  Filled 2021-05-17 – 2021-05-25 (×2): qty 150, 30d supply, fill #0
  Filled 2021-06-17 – 2021-06-19 (×2): qty 150, 30d supply, fill #1
  Filled 2021-07-26: qty 150, 30d supply, fill #2

## 2021-05-18 ENCOUNTER — Other Ambulatory Visit (HOSPITAL_COMMUNITY): Payer: Self-pay

## 2021-05-20 ENCOUNTER — Other Ambulatory Visit (HOSPITAL_COMMUNITY): Payer: Self-pay

## 2021-05-24 ENCOUNTER — Other Ambulatory Visit (HOSPITAL_COMMUNITY): Payer: Self-pay

## 2021-05-25 ENCOUNTER — Other Ambulatory Visit (HOSPITAL_COMMUNITY): Payer: Self-pay

## 2021-05-25 DIAGNOSIS — G4733 Obstructive sleep apnea (adult) (pediatric): Secondary | ICD-10-CM | POA: Diagnosis not present

## 2021-05-26 ENCOUNTER — Other Ambulatory Visit (HOSPITAL_COMMUNITY): Payer: Self-pay

## 2021-05-28 ENCOUNTER — Other Ambulatory Visit (HOSPITAL_COMMUNITY): Payer: Self-pay

## 2021-05-31 ENCOUNTER — Other Ambulatory Visit (HOSPITAL_COMMUNITY): Payer: Self-pay

## 2021-06-01 ENCOUNTER — Other Ambulatory Visit (HOSPITAL_COMMUNITY): Payer: Self-pay

## 2021-06-01 MED ORDER — TAMSULOSIN HCL 0.4 MG PO CAPS
0.4000 mg | ORAL_CAPSULE | Freq: Every day | ORAL | 0 refills | Status: DC
  Filled 2021-06-01: qty 30, 30d supply, fill #0

## 2021-06-12 ENCOUNTER — Other Ambulatory Visit (HOSPITAL_COMMUNITY): Payer: Self-pay

## 2021-06-17 ENCOUNTER — Other Ambulatory Visit (HOSPITAL_COMMUNITY): Payer: Self-pay

## 2021-06-18 ENCOUNTER — Other Ambulatory Visit (HOSPITAL_COMMUNITY): Payer: Self-pay

## 2021-06-21 ENCOUNTER — Other Ambulatory Visit (HOSPITAL_COMMUNITY): Payer: Self-pay

## 2021-07-22 ENCOUNTER — Other Ambulatory Visit (HOSPITAL_COMMUNITY): Payer: Self-pay

## 2021-07-22 DIAGNOSIS — N200 Calculus of kidney: Secondary | ICD-10-CM | POA: Diagnosis not present

## 2021-07-22 DIAGNOSIS — R350 Frequency of micturition: Secondary | ICD-10-CM | POA: Diagnosis not present

## 2021-07-22 MED ORDER — TAMSULOSIN HCL 0.4 MG PO CAPS
0.4000 mg | ORAL_CAPSULE | Freq: Every day | ORAL | 1 refills | Status: DC
  Filled 2021-07-22: qty 21, 21d supply, fill #0
  Filled 2021-10-10: qty 21, 21d supply, fill #1

## 2021-07-22 MED ORDER — ONDANSETRON HCL 4 MG PO TABS
4.0000 mg | ORAL_TABLET | Freq: Three times a day (TID) | ORAL | 0 refills | Status: DC | PRN
  Filled 2021-07-22: qty 10, 4d supply, fill #0

## 2021-07-22 MED ORDER — HYDROCODONE-ACETAMINOPHEN 5-325 MG PO TABS
1.0000 | ORAL_TABLET | Freq: Three times a day (TID) | ORAL | 0 refills | Status: DC | PRN
Start: 2021-07-22 — End: 2023-06-12
  Filled 2021-07-22: qty 14, 3d supply, fill #0

## 2021-07-26 ENCOUNTER — Other Ambulatory Visit (HOSPITAL_COMMUNITY): Payer: Self-pay

## 2021-08-02 ENCOUNTER — Other Ambulatory Visit (HOSPITAL_COMMUNITY): Payer: Self-pay

## 2021-08-15 DIAGNOSIS — R600 Localized edema: Secondary | ICD-10-CM | POA: Diagnosis not present

## 2021-08-16 ENCOUNTER — Other Ambulatory Visit (HOSPITAL_COMMUNITY): Payer: Self-pay

## 2021-08-16 MED ORDER — HYDROCHLOROTHIAZIDE 25 MG PO TABS
25.0000 mg | ORAL_TABLET | Freq: Every morning | ORAL | 1 refills | Status: DC
Start: 2021-08-15 — End: 2021-11-10
  Filled 2021-08-16: qty 30, 30d supply, fill #0
  Filled 2021-09-10: qty 30, 30d supply, fill #1

## 2021-08-23 DIAGNOSIS — I10 Essential (primary) hypertension: Secondary | ICD-10-CM | POA: Diagnosis not present

## 2021-08-23 DIAGNOSIS — R600 Localized edema: Secondary | ICD-10-CM | POA: Diagnosis not present

## 2021-08-26 ENCOUNTER — Other Ambulatory Visit (HOSPITAL_COMMUNITY): Payer: Self-pay

## 2021-09-03 ENCOUNTER — Other Ambulatory Visit (HOSPITAL_COMMUNITY): Payer: Self-pay

## 2021-09-10 ENCOUNTER — Other Ambulatory Visit (HOSPITAL_COMMUNITY): Payer: Self-pay

## 2021-10-10 ENCOUNTER — Other Ambulatory Visit (HOSPITAL_COMMUNITY): Payer: Self-pay

## 2021-10-11 ENCOUNTER — Other Ambulatory Visit (HOSPITAL_COMMUNITY): Payer: Self-pay

## 2021-10-12 ENCOUNTER — Other Ambulatory Visit (HOSPITAL_COMMUNITY): Payer: Self-pay

## 2021-10-20 ENCOUNTER — Other Ambulatory Visit (HOSPITAL_COMMUNITY): Payer: Self-pay

## 2021-10-20 MED ORDER — MONTELUKAST SODIUM 10 MG PO TABS
10.0000 mg | ORAL_TABLET | Freq: Every day | ORAL | 1 refills | Status: DC
  Filled 2021-10-20: qty 90, 90d supply, fill #0
  Filled 2021-12-20 – 2022-01-21 (×2): qty 90, 90d supply, fill #1

## 2021-11-10 ENCOUNTER — Other Ambulatory Visit (HOSPITAL_COMMUNITY): Payer: Self-pay

## 2021-11-10 DIAGNOSIS — E291 Testicular hypofunction: Secondary | ICD-10-CM | POA: Diagnosis not present

## 2021-11-10 DIAGNOSIS — Z23 Encounter for immunization: Secondary | ICD-10-CM | POA: Diagnosis not present

## 2021-11-10 DIAGNOSIS — I10 Essential (primary) hypertension: Secondary | ICD-10-CM | POA: Diagnosis not present

## 2021-11-10 DIAGNOSIS — Z6841 Body Mass Index (BMI) 40.0 and over, adult: Secondary | ICD-10-CM | POA: Diagnosis not present

## 2021-11-10 DIAGNOSIS — R609 Edema, unspecified: Secondary | ICD-10-CM | POA: Diagnosis not present

## 2021-11-10 MED ORDER — HYDROCHLOROTHIAZIDE 25 MG PO TABS
25.0000 mg | ORAL_TABLET | Freq: Every morning | ORAL | 4 refills | Status: DC
  Filled 2021-11-10: qty 90, 90d supply, fill #0
  Filled 2021-12-20 – 2022-01-21 (×2): qty 90, 90d supply, fill #1
  Filled 2022-04-21: qty 90, 90d supply, fill #2
  Filled 2022-07-17: qty 90, 90d supply, fill #3

## 2021-11-16 ENCOUNTER — Other Ambulatory Visit (HOSPITAL_COMMUNITY): Payer: Self-pay

## 2021-11-23 ENCOUNTER — Other Ambulatory Visit (HOSPITAL_COMMUNITY): Payer: Self-pay

## 2021-11-25 ENCOUNTER — Other Ambulatory Visit (HOSPITAL_COMMUNITY): Payer: Self-pay

## 2021-12-15 ENCOUNTER — Other Ambulatory Visit (HOSPITAL_COMMUNITY): Payer: Self-pay

## 2021-12-15 MED ORDER — CELECOXIB 200 MG PO CAPS
200.0000 mg | ORAL_CAPSULE | Freq: Two times a day (BID) | ORAL | 1 refills | Status: DC
  Filled 2021-12-15: qty 90, 45d supply, fill #0
  Filled 2022-01-21: qty 90, 45d supply, fill #1

## 2021-12-20 ENCOUNTER — Other Ambulatory Visit (HOSPITAL_COMMUNITY): Payer: Self-pay

## 2021-12-21 ENCOUNTER — Other Ambulatory Visit (HOSPITAL_COMMUNITY): Payer: Self-pay

## 2021-12-23 ENCOUNTER — Other Ambulatory Visit (HOSPITAL_COMMUNITY): Payer: Self-pay

## 2021-12-23 MED ORDER — AMOXICILLIN 500 MG PO CAPS
2000.0000 mg | ORAL_CAPSULE | ORAL | 2 refills | Status: DC
  Filled 2021-12-23: qty 8, 2d supply, fill #0
  Filled 2022-01-21: qty 8, 2d supply, fill #1
  Filled 2022-04-21: qty 8, 2d supply, fill #2

## 2021-12-28 ENCOUNTER — Other Ambulatory Visit (HOSPITAL_COMMUNITY): Payer: Self-pay

## 2022-01-03 ENCOUNTER — Other Ambulatory Visit (HOSPITAL_COMMUNITY): Payer: Self-pay

## 2022-01-24 ENCOUNTER — Other Ambulatory Visit (HOSPITAL_COMMUNITY): Payer: Self-pay

## 2022-02-23 DIAGNOSIS — L814 Other melanin hyperpigmentation: Secondary | ICD-10-CM | POA: Diagnosis not present

## 2022-02-23 DIAGNOSIS — L820 Inflamed seborrheic keratosis: Secondary | ICD-10-CM | POA: Diagnosis not present

## 2022-02-23 DIAGNOSIS — D225 Melanocytic nevi of trunk: Secondary | ICD-10-CM | POA: Diagnosis not present

## 2022-02-23 DIAGNOSIS — L578 Other skin changes due to chronic exposure to nonionizing radiation: Secondary | ICD-10-CM | POA: Diagnosis not present

## 2022-02-23 DIAGNOSIS — L298 Other pruritus: Secondary | ICD-10-CM | POA: Diagnosis not present

## 2022-02-23 DIAGNOSIS — L821 Other seborrheic keratosis: Secondary | ICD-10-CM | POA: Diagnosis not present

## 2022-02-23 DIAGNOSIS — L570 Actinic keratosis: Secondary | ICD-10-CM | POA: Diagnosis not present

## 2022-02-23 DIAGNOSIS — D485 Neoplasm of uncertain behavior of skin: Secondary | ICD-10-CM | POA: Diagnosis not present

## 2022-02-23 DIAGNOSIS — D2271 Melanocytic nevi of right lower limb, including hip: Secondary | ICD-10-CM | POA: Diagnosis not present

## 2022-02-23 DIAGNOSIS — L538 Other specified erythematous conditions: Secondary | ICD-10-CM | POA: Diagnosis not present

## 2022-04-21 ENCOUNTER — Other Ambulatory Visit: Payer: Self-pay

## 2022-04-21 ENCOUNTER — Other Ambulatory Visit (HOSPITAL_COMMUNITY): Payer: Self-pay

## 2022-04-21 MED ORDER — MONTELUKAST SODIUM 10 MG PO TABS
10.0000 mg | ORAL_TABLET | Freq: Every day | ORAL | 2 refills | Status: DC
  Filled 2022-04-21: qty 90, 90d supply, fill #0

## 2022-04-21 MED ORDER — CELECOXIB 200 MG PO CAPS
200.0000 mg | ORAL_CAPSULE | Freq: Every day | ORAL | 0 refills | Status: DC
  Filled 2022-04-21: qty 90, 90d supply, fill #0

## 2022-05-03 DIAGNOSIS — Z0000 Encounter for general adult medical examination without abnormal findings: Secondary | ICD-10-CM | POA: Diagnosis not present

## 2022-05-03 DIAGNOSIS — E291 Testicular hypofunction: Secondary | ICD-10-CM | POA: Diagnosis not present

## 2022-05-03 DIAGNOSIS — E538 Deficiency of other specified B group vitamins: Secondary | ICD-10-CM | POA: Diagnosis not present

## 2022-05-03 DIAGNOSIS — Z125 Encounter for screening for malignant neoplasm of prostate: Secondary | ICD-10-CM | POA: Diagnosis not present

## 2022-05-03 DIAGNOSIS — Z13220 Encounter for screening for lipoid disorders: Secondary | ICD-10-CM | POA: Diagnosis not present

## 2022-05-12 ENCOUNTER — Other Ambulatory Visit (HOSPITAL_COMMUNITY): Payer: Self-pay

## 2022-05-12 ENCOUNTER — Other Ambulatory Visit (HOSPITAL_COMMUNITY): Payer: Self-pay | Admitting: Family Medicine

## 2022-05-12 DIAGNOSIS — E538 Deficiency of other specified B group vitamins: Secondary | ICD-10-CM | POA: Diagnosis not present

## 2022-05-12 DIAGNOSIS — Z125 Encounter for screening for malignant neoplasm of prostate: Secondary | ICD-10-CM | POA: Diagnosis not present

## 2022-05-12 DIAGNOSIS — E7800 Pure hypercholesterolemia, unspecified: Secondary | ICD-10-CM | POA: Diagnosis not present

## 2022-05-12 DIAGNOSIS — I10 Essential (primary) hypertension: Secondary | ICD-10-CM | POA: Diagnosis not present

## 2022-05-12 DIAGNOSIS — Z0000 Encounter for general adult medical examination without abnormal findings: Secondary | ICD-10-CM | POA: Diagnosis not present

## 2022-05-12 DIAGNOSIS — E291 Testicular hypofunction: Secondary | ICD-10-CM | POA: Diagnosis not present

## 2022-05-12 DIAGNOSIS — J309 Allergic rhinitis, unspecified: Secondary | ICD-10-CM | POA: Diagnosis not present

## 2022-05-12 DIAGNOSIS — M179 Osteoarthritis of knee, unspecified: Secondary | ICD-10-CM | POA: Diagnosis not present

## 2022-05-12 DIAGNOSIS — R7301 Impaired fasting glucose: Secondary | ICD-10-CM | POA: Diagnosis not present

## 2022-05-12 MED ORDER — HYDROCHLOROTHIAZIDE 25 MG PO TABS
25.0000 mg | ORAL_TABLET | Freq: Every day | ORAL | 4 refills | Status: DC
  Filled 2022-05-12: qty 90, 90d supply, fill #0

## 2022-05-12 MED ORDER — LOSARTAN POTASSIUM 100 MG PO TABS
100.0000 mg | ORAL_TABLET | Freq: Every day | ORAL | 4 refills | Status: DC
  Filled 2022-05-12 – 2022-07-17 (×3): qty 90, 90d supply, fill #0
  Filled 2022-08-12 – 2022-11-08 (×3): qty 90, 90d supply, fill #1

## 2022-05-12 MED ORDER — SILDENAFIL CITRATE 100 MG PO TABS
100.0000 mg | ORAL_TABLET | Freq: Every day | ORAL | 5 refills | Status: DC
  Filled 2022-05-12 – 2022-09-05 (×2): qty 30, 30d supply, fill #0

## 2022-05-12 MED ORDER — MONTELUKAST SODIUM 10 MG PO TABS
10.0000 mg | ORAL_TABLET | Freq: Every day | ORAL | 4 refills | Status: DC
  Filled 2022-05-12 – 2022-07-17 (×3): qty 90, 90d supply, fill #0
  Filled 2022-10-21 – 2022-11-15 (×2): qty 90, 90d supply, fill #1
  Filled 2023-02-03: qty 90, 90d supply, fill #2
  Filled 2023-05-10: qty 90, 90d supply, fill #3

## 2022-05-12 MED ORDER — CELECOXIB 200 MG PO CAPS
200.0000 mg | ORAL_CAPSULE | Freq: Every day | ORAL | 0 refills | Status: DC
  Filled 2022-05-12 – 2022-08-26 (×2): qty 90, 90d supply, fill #0

## 2022-05-30 DIAGNOSIS — I10 Essential (primary) hypertension: Secondary | ICD-10-CM | POA: Diagnosis not present

## 2022-05-30 DIAGNOSIS — Z6841 Body Mass Index (BMI) 40.0 and over, adult: Secondary | ICD-10-CM | POA: Diagnosis not present

## 2022-05-30 DIAGNOSIS — G4733 Obstructive sleep apnea (adult) (pediatric): Secondary | ICD-10-CM | POA: Diagnosis not present

## 2022-06-03 ENCOUNTER — Encounter (HOSPITAL_COMMUNITY): Payer: Self-pay

## 2022-06-03 ENCOUNTER — Ambulatory Visit (HOSPITAL_COMMUNITY)
Admission: RE | Admit: 2022-06-03 | Discharge: 2022-06-03 | Disposition: A | Discharge status: Home / Self Care | Payer: Commercial Managed Care - PPO | Source: Ambulatory Visit | Attending: Family Medicine | Admitting: Family Medicine

## 2022-06-03 DIAGNOSIS — Z0000 Encounter for general adult medical examination without abnormal findings: Secondary | ICD-10-CM

## 2022-06-15 NOTE — Progress Notes (Unsigned)
Cardiology Office Note:   Date:  06/16/2022  ID:  [name], DOB 1960-12-26, MRN [id number]  History of Present Illness:   Adam Avila is a 62 y.o. male coronary artery calcification, HTN, OSA on CPAP who was referred by Dr. Tenny Craw for elevated Ca score.  PCP notes reviewed. Underwent Ca scoring in 05/2022, which was elevated at 383 (81%) prompting referral to Cardiology for further evaluation.  Today, the patient overall feels okay. He has been noting more dyspnea on exertion and fatigue over the past several months. Has occasional "fullness in his chest" as well with exertion. States that he will walk and when he goes up a hill, he notes more significant DOE despite being active for years. He does admit his weight may be playing a role, but the degree of symptoms seem out of proportion to his weight.   Past Medical History:  Diagnosis Date   Allergic rhinitis    Arthritis    "back" (12/29/2014)   BPH (benign prostatic hyperplasia)    Class 3 severe obesity due to excess calories with serious comorbidity and body mass index (BMI) of 40.0 to 44.9 in adult [facility])    DDD (degenerative disc disease), lumbar    Diverticulosis    ED (erectile dysfunction)    Edema    Elevated coronary artery calcium score    Elevated fasting glucose    Exertional dyspnea    Fatigue    High cholesterol    History of kidney stones    HNP (herniated nucleus pulposus), lumbar    l4-5   HTN (hypertension)    Hypertension    Kidney stone    Leg fracture, right 1979   Leg fracture, right 1967   Liver cyst    Low testosterone    OA (osteoarthritis) of knee    Primary localized osteoarthritis of left knee 12/29/2014   Renal calculus    Sleep apnea    CPAP   Snoring    Vitamin B12 deficiency      ROS: As per HPI  Studies Reviewed:    EKG:  NSR, LVH, HR 68bpm  Cardiac Studies & Procedures          CT SCANS  CT CARDIAC SCORING (SELF PAY ONLY) 06/06/2022  Addendum 06/06/2022 12:42  PM ADDENDUM REPORT: 06/06/2022 12:40  EXAM: OVER-READ INTERPRETATION  CT CHEST  The following report is an over-read performed by radiologist Dr. Narda Rutherford of [facility] on 06/06/2022. This over-read does not include interpretation of cardiac or coronary anatomy or pathology. The coronary calcium score interpretation by the cardiologist is attached.  COMPARISON:  None.  FINDINGS: Vascular: No aortic atherosclerosis. The included aorta is normal in caliber.  Mediastinum/nodes: No adenopathy or mass. Unremarkable esophagus.  Lungs: No focal airspace disease. No pulmonary nodule. No pleural fluid. The included airways are patent.  Upper abdomen: No acute or unexpected findings.  Musculoskeletal: There are no acute or suspicious osseous abnormalities.  IMPRESSION: No acute or unexpected extracardiac findings.   Electronically Signed By: Narda Rutherford M.D. On: 06/06/2022 12:40  Narrative CLINICAL DATA:  Cardiovascular Disease Risk stratification  EXAM: Coronary Calcium Score  TECHNIQUE: A gated, non-contrast computed tomography scan of the heart was performed using 3mm slice thickness. Axial images were analyzed on a dedicated workstation. Calcium scoring of the coronary arteries was performed using the Agatston method.  FINDINGS: Coronary arteries: Normal origins.  Coronary Calcium Score:  Left main: 0  Left anterior descending artery: 168  Left circumflex artery: 18  Right coronary artery: 196  Total: 383  Percentile: 81  Pericardium: Normal.  Ascending Aorta: Normal caliber.  Non-cardiac: See separate report from [facility].  IMPRESSION: Coronary calcium score of 383. This was 81st percentile for age-, race-, and sex-matched controls.  RECOMMENDATIONS: Coronary artery calcium (CAC) score is a strong predictor of incident coronary heart disease (CHD) and provides predictive information beyond traditional risk  factors. CAC scoring is reasonable to use in the decision to withhold, postpone, or initiate statin therapy in intermediate-risk or selected borderline-risk asymptomatic adults (age 72-75 years and LDL-C >=70 to <190 mg/dL) who do not have diabetes or established atherosclerotic cardiovascular disease (ASCVD).* In intermediate-risk (10-year ASCVD risk >=7.5% to <20%) adults or selected borderline-risk (10-year ASCVD risk >=5% to <7.5%) adults in whom a CAC score is measured for the purpose of making a treatment decision the following recommendations have been made:  If CAC=0, it is reasonable to withhold statin therapy and reassess in 5 to 10 years, as long as higher risk conditions are absent (diabetes mellitus, family history of premature CHD in first degree relatives (males <55 years; females <65 years), cigarette smoking, or LDL >=190 mg/dL).  If CAC is 1 to 99, it is reasonable to initiate statin therapy for patients >=42 years of age.  If CAC is >=100 or >=75th percentile, it is reasonable to initiate statin therapy at any age.  Cardiology referral should be considered for patients with CAC scores >=400 or >=75th percentile.  *2018 AHA/ACC/AACVPR/AAPA/ABC/ACPM/ADA/AGS/APhA/ASPC/NLA/PCNA Guideline on the Management of Blood Cholesterol: A Report of the American College of Cardiology/American Heart Association Task Force on Clinical Practice Guidelines. J Am Coll Cardiol. 2019;73(24):3168-3209.  Epifanio Lesches, MD  Electronically Signed: By: Epifanio Lesches M.D. On: 06/03/2022 10:28           Risk Assessment/Calculations:              Physical Exam:   VS:  BP 138/88   Pulse 68   Ht 5\' 10"  (1.778 m)   Wt 294 lb 12.8 oz (133.7 kg)   SpO2 96%   BMI 42.30 kg/m    Wt Readings from Last 3 Encounters:  06/16/22 294 lb 12.8 oz (133.7 kg)  12/30/20 288 lb 4 oz (130.7 kg)  12/18/20 288 lb 4 oz (130.7 kg)     GEN: Well nourished, well developed in no  acute distress NECK: No JVD; No carotid bruits CARDIAC: RRR, no murmurs, rubs, gallops RESPIRATORY:  Clear to auscultation without rales, wheezing or rhonchi  ABDOMEN: Soft, non-tender, non-distended EXTREMITIES:  Trace LE edema, warm  ASSESSMENT AND PLAN:    #DOE with chest heaviness: #Coronary Artery Ca: -Patient with DOE and chest heaviness especially when walking inclines that has been ongoing for the past several months to one year -Has known CAD with Ca score 383 (81%) -Will check coronary CTA for further evaluation -Start crestor 10mg  daily -Continue ASA 81mg  daily  #HTN: -Blood pressure well controlled at home -Continue HCTZ 25mg  daily -Continue losartan 100mg  daily  #HLD: -Start crestor 10mg  daily -Lipids in 8 weeks  #OSA: -Compliant with CPAP  #Morbid Obesity: BMI 42. Working on lifestyle modifications        Signed, Meriam Sprague, MD

## 2022-06-16 ENCOUNTER — Telehealth: Payer: Self-pay | Admitting: *Deleted

## 2022-06-16 ENCOUNTER — Encounter: Payer: Self-pay | Admitting: Cardiology

## 2022-06-16 ENCOUNTER — Other Ambulatory Visit (HOSPITAL_COMMUNITY): Payer: Self-pay

## 2022-06-16 ENCOUNTER — Ambulatory Visit: Payer: Commercial Managed Care - PPO | Attending: Cardiology | Admitting: Cardiology

## 2022-06-16 VITALS — BP 138/88 | HR 68 | Ht 70.0 in | Wt 294.8 lb

## 2022-06-16 DIAGNOSIS — Z79899 Other long term (current) drug therapy: Secondary | ICD-10-CM

## 2022-06-16 DIAGNOSIS — I25118 Atherosclerotic heart disease of native coronary artery with other forms of angina pectoris: Secondary | ICD-10-CM | POA: Diagnosis not present

## 2022-06-16 DIAGNOSIS — E7800 Pure hypercholesterolemia, unspecified: Secondary | ICD-10-CM | POA: Diagnosis not present

## 2022-06-16 DIAGNOSIS — I10 Essential (primary) hypertension: Secondary | ICD-10-CM

## 2022-06-16 DIAGNOSIS — G4733 Obstructive sleep apnea (adult) (pediatric): Secondary | ICD-10-CM | POA: Diagnosis not present

## 2022-06-16 DIAGNOSIS — R072 Precordial pain: Secondary | ICD-10-CM | POA: Diagnosis not present

## 2022-06-16 MED ORDER — METOPROLOL TARTRATE 100 MG PO TABS
100.0000 mg | ORAL_TABLET | Freq: Once | ORAL | 0 refills | Status: DC
  Filled 2022-06-16: qty 1, 1d supply, fill #0

## 2022-06-16 MED ORDER — ROSUVASTATIN CALCIUM 10 MG PO TABS
10.0000 mg | ORAL_TABLET | Freq: Every day | ORAL | 2 refills | Status: DC
  Filled 2022-06-16: qty 90, 90d supply, fill #0
  Filled 2022-07-17 – 2022-08-29 (×3): qty 90, 90d supply, fill #1

## 2022-06-16 NOTE — Patient Instructions (Addendum)
Medication Instructions:   START TAKING ROSUVASTATIN (CRESTOR) 10 MG BY MOUTH DAILY  *If you need a refill on your cardiac medications before your next appointment, please call your pharmacy*   Lab Work:  IN 8 WEEKS HERE IN THE OFFICE--LIPIDS  If you have labs (blood work) drawn today and your tests are completely normal, you will receive your results only by: MyChart Message (if you have MyChart) OR A paper copy in the mail If you have any lab test that is abnormal or we need to change your treatment, we will call you to review the results.    Testing/Procedures:  Your physician has requested that you have an echocardiogram. Echocardiography is a painless test that uses sound waves to create images of your heart. It provides your doctor with information about the size and shape of your heart and how well your heart's chambers and valves are working. This procedure takes approximately one hour. There are no restrictions for this procedure. Please do NOT wear cologne, perfume, aftershave, or lotions (deodorant is allowed). Please arrive 15 minutes prior to your appointment time.      Your cardiac CT will be scheduled at one of the below locations:   [facility] [address] [place], [place] [address] [phone]   If scheduled at [facility], please arrive at the [facility] and Children's Entrance (Entrance C2) of [facility] 30 minutes prior to test start time. You can use the FREE valet parking offered at entrance C (encouraged to control the heart rate for the test)  Proceed to the [facility] Radiology Department (first floor) to check-in and test prep.  All radiology patients and guests should use entrance C2 at [facility], accessed from [facility], even though the hospital's physical address listed is [address]     Please follow these instructions carefully (unless otherwise directed):  Hold all  erectile dysfunction medications at least 3 days (72 hrs) prior to test. (Ie viagra, cialis, sildenafil, tadalafil, etc) We will administer nitroglycerin during this exam.   On the Night Before the Test: Be sure to Drink plenty of water. Do not consume any caffeinated/decaffeinated beverages or chocolate 12 hours prior to your test. Do not take any antihistamines 12 hours prior to your test.  HOLD ZYRTEC (CETRIZINE)    On the Day of the Test: Drink plenty of water until 1 hour prior to the test. Do not eat any food 1 hour prior to test. You may take your regular medications prior to the test.  Take metoprolol 100 MG BY MOUTH (Lopressor) two hours prior to test. If you take Hydrochlorothiazide please HOLD on the morning of the test.          After the Test: Drink plenty of water. After receiving IV contrast, you may experience a mild flushed feeling. This is normal. On occasion, you may experience a mild rash up to 24 hours after the test. This is not dangerous. If this occurs, you can take Benadryl 25 mg and increase your fluid intake. If you experience trouble breathing, this can be serious. If it is severe call 911 IMMEDIATELY. If it is mild, please call our office. If you take any of these medications: Glipizide/Metformin, Avandament, Glucavance, please do not take 48 hours after completing test unless otherwise instructed.  We will call to schedule your test 2-4 weeks out understanding that some insurance companies will need an authorization prior to the service being performed.  For non-scheduling related questions, please contact the cardiac imaging nurse navigator should you have any questions/concerns: Rockwell Alexandria, Cardiac Imaging Nurse Navigator Larey Brick, Cardiac Imaging Nurse Navigator [facility] Direct Office Dial: [phone]   For scheduling needs, including cancellations and rescheduling, please call [place],  [phone].    Follow-Up: At [facility], you and your health needs are our priority.  As part of our continuing mission to provide you with exceptional heart care, we have created designated Provider Care Teams.  These Care Teams include your primary Cardiologist (physician) and Advanced Practice Providers (APPs -  Physician Assistants and Nurse Practitioners) who all work together to provide you with the care you need, when you need it.  We recommend signing up for the patient portal called "MyChart".  Sign up information is provided on this After Visit Summary.  MyChart is used to connect with patients for Virtual Visits (Telemedicine).  Patients are able to view lab/test results, encounter notes, upcoming appointments, etc.  Non-urgent messages can be sent to your provider as well.   To learn more about what you can do with MyChart, go to [email or web address].    Your next appointment:   6 month(s)  Provider:   DR. Shari Prows

## 2022-06-16 NOTE — Telephone Encounter (Signed)
-----   Message from Lorrin Jackson sent at 06/16/2022  2:14 PM EDT ----- Regarding: ct scan Scheduled 5/10 at 8:00

## 2022-06-23 ENCOUNTER — Telehealth (HOSPITAL_COMMUNITY): Payer: Self-pay | Admitting: Emergency Medicine

## 2022-06-23 NOTE — Telephone Encounter (Signed)
Reaching out to patient to offer assistance regarding upcoming cardiac imaging study; pt verbalizes understanding of appt date/time, parking situation and where to check in, pre-test NPO status and medications ordered, and verified current allergies; name and call back number provided for further questions should they arise Deona Novitski RN Navigator Cardiac Imaging [facility] [phone] office [phone] cell 

## 2022-06-23 NOTE — Telephone Encounter (Signed)
Attempted to call patient regarding upcoming cardiac CT appointment. °Left message on voicemail with name and callback number °Matthias Bogus RN Navigator Cardiac Imaging °[facility] °[phone] Office °[phone] Cell ° °

## 2022-06-24 ENCOUNTER — Ambulatory Visit (HOSPITAL_COMMUNITY)
Admission: RE | Admit: 2022-06-24 | Discharge: 2022-06-24 | Disposition: A | Discharge status: Home / Self Care | Payer: Commercial Managed Care - PPO | Source: Ambulatory Visit | Attending: Cardiology | Admitting: Cardiology

## 2022-06-24 DIAGNOSIS — H5203 Hypermetropia, bilateral: Secondary | ICD-10-CM | POA: Diagnosis not present

## 2022-06-24 DIAGNOSIS — G4733 Obstructive sleep apnea (adult) (pediatric): Secondary | ICD-10-CM | POA: Insufficient documentation

## 2022-06-24 DIAGNOSIS — E7800 Pure hypercholesterolemia, unspecified: Secondary | ICD-10-CM

## 2022-06-24 DIAGNOSIS — I25118 Atherosclerotic heart disease of native coronary artery with other forms of angina pectoris: Secondary | ICD-10-CM | POA: Insufficient documentation

## 2022-06-24 DIAGNOSIS — R072 Precordial pain: Secondary | ICD-10-CM | POA: Diagnosis present

## 2022-06-24 DIAGNOSIS — Z79899 Other long term (current) drug therapy: Secondary | ICD-10-CM

## 2022-06-24 DIAGNOSIS — I10 Essential (primary) hypertension: Secondary | ICD-10-CM | POA: Insufficient documentation

## 2022-06-24 MED ORDER — NITROGLYCERIN 0.4 MG SL SUBL
SUBLINGUAL_TABLET | SUBLINGUAL | Status: AC
  Filled 2022-06-24: qty 2

## 2022-06-24 MED ORDER — IOHEXOL 350 MG/ML SOLN
100.0000 mL | Freq: Once | INTRAVENOUS | Status: AC | PRN
  Administered 2022-06-24: 100 mL via INTRAVENOUS

## 2022-06-24 MED ORDER — NITROGLYCERIN 0.4 MG SL SUBL
0.8000 mg | SUBLINGUAL_TABLET | Freq: Once | SUBLINGUAL | Status: AC
  Administered 2022-06-24: 0.8 mg via SUBLINGUAL

## 2022-06-25 ENCOUNTER — Other Ambulatory Visit (HOSPITAL_COMMUNITY): Payer: Self-pay

## 2022-06-27 ENCOUNTER — Other Ambulatory Visit: Payer: Self-pay

## 2022-06-29 ENCOUNTER — Other Ambulatory Visit (HOSPITAL_COMMUNITY): Payer: Self-pay

## 2022-06-29 MED ORDER — AMOXICILLIN 250 MG PO CAPS
250.0000 mg | ORAL_CAPSULE | Freq: Four times a day (QID) | ORAL | 0 refills | Status: DC
  Filled 2022-06-29: qty 40, 10d supply, fill #0

## 2022-06-30 DIAGNOSIS — L821 Other seborrheic keratosis: Secondary | ICD-10-CM | POA: Diagnosis not present

## 2022-06-30 DIAGNOSIS — L538 Other specified erythematous conditions: Secondary | ICD-10-CM | POA: Diagnosis not present

## 2022-06-30 DIAGNOSIS — L820 Inflamed seborrheic keratosis: Secondary | ICD-10-CM | POA: Diagnosis not present

## 2022-06-30 DIAGNOSIS — L814 Other melanin hyperpigmentation: Secondary | ICD-10-CM | POA: Diagnosis not present

## 2022-06-30 DIAGNOSIS — L578 Other skin changes due to chronic exposure to nonionizing radiation: Secondary | ICD-10-CM | POA: Diagnosis not present

## 2022-06-30 DIAGNOSIS — R208 Other disturbances of skin sensation: Secondary | ICD-10-CM | POA: Diagnosis not present

## 2022-06-30 DIAGNOSIS — Z09 Encounter for follow-up examination after completed treatment for conditions other than malignant neoplasm: Secondary | ICD-10-CM | POA: Diagnosis not present

## 2022-07-18 ENCOUNTER — Other Ambulatory Visit (HOSPITAL_COMMUNITY): Payer: Self-pay

## 2022-07-18 ENCOUNTER — Other Ambulatory Visit: Payer: Self-pay

## 2022-07-19 ENCOUNTER — Other Ambulatory Visit (HOSPITAL_COMMUNITY): Payer: Commercial Managed Care - PPO

## 2022-07-21 ENCOUNTER — Other Ambulatory Visit (HOSPITAL_COMMUNITY): Payer: Self-pay

## 2022-08-12 ENCOUNTER — Ambulatory Visit (HOSPITAL_COMMUNITY): Payer: Commercial Managed Care - PPO | Attending: Internal Medicine

## 2022-08-12 ENCOUNTER — Other Ambulatory Visit (HOSPITAL_COMMUNITY): Payer: Self-pay

## 2022-08-12 ENCOUNTER — Ambulatory Visit: Payer: Commercial Managed Care - PPO | Attending: Cardiology

## 2022-08-12 DIAGNOSIS — I10 Essential (primary) hypertension: Secondary | ICD-10-CM

## 2022-08-12 DIAGNOSIS — E7800 Pure hypercholesterolemia, unspecified: Secondary | ICD-10-CM | POA: Diagnosis not present

## 2022-08-12 DIAGNOSIS — I25118 Atherosclerotic heart disease of native coronary artery with other forms of angina pectoris: Secondary | ICD-10-CM | POA: Diagnosis not present

## 2022-08-12 DIAGNOSIS — G4733 Obstructive sleep apnea (adult) (pediatric): Secondary | ICD-10-CM | POA: Diagnosis not present

## 2022-08-12 DIAGNOSIS — R072 Precordial pain: Secondary | ICD-10-CM

## 2022-08-12 DIAGNOSIS — Z79899 Other long term (current) drug therapy: Secondary | ICD-10-CM

## 2022-08-12 LAB — ECHOCARDIOGRAM COMPLETE
Area-P 1/2: 3.08 cm2
S' Lateral: 3.1 cm

## 2022-08-12 MED ORDER — PERFLUTREN LIPID MICROSPHERE
1.0000 mL | INTRAVENOUS | Status: AC | PRN
Start: 2022-08-12 — End: 2022-08-12
  Administered 2022-08-12: 3 mL via INTRAVENOUS

## 2022-08-13 LAB — BASIC METABOLIC PANEL
BUN/Creatinine Ratio: 20 (ref 10–24)
BUN: 30 mg/dL — ABNORMAL HIGH (ref 8–27)
CO2: 26 mmol/L (ref 20–29)
Calcium: 9.7 mg/dL (ref 8.6–10.2)
Chloride: 98 mmol/L (ref 96–106)
Creatinine, Ser: 1.51 mg/dL — ABNORMAL HIGH (ref 0.76–1.27)
Glucose: 95 mg/dL (ref 70–99)
Potassium: 3.6 mmol/L (ref 3.5–5.2)
Sodium: 139 mmol/L (ref 134–144)
eGFR: 51 mL/min/{1.73_m2} — ABNORMAL LOW (ref >59)

## 2022-08-13 LAB — LIPID PANEL
Chol/HDL Ratio: 2.5 ratio (ref 0.0–5.0)
Cholesterol, Total: 106 mg/dL (ref 100–199)
HDL: 42 mg/dL (ref >39)
LDL Chol Calc (NIH): 48 mg/dL (ref 0–99)
Triglycerides: 78 mg/dL (ref 0–149)
VLDL Cholesterol Cal: 16 mg/dL (ref 5–40)

## 2022-08-15 ENCOUNTER — Telehealth: Payer: Self-pay | Admitting: Cardiology

## 2022-08-15 DIAGNOSIS — Z79899 Other long term (current) drug therapy: Secondary | ICD-10-CM

## 2022-08-15 DIAGNOSIS — I10 Essential (primary) hypertension: Secondary | ICD-10-CM

## 2022-08-15 NOTE — Telephone Encounter (Signed)
Pt returning nurses call regarding results. Please advise 

## 2022-08-15 NOTE — Telephone Encounter (Signed)
Pt aware of lab results and agrees with  plan ./cy ?

## 2022-08-15 NOTE — Telephone Encounter (Signed)
Adam Sprague, MD 08/15/2022  9:01 AM EDT     His Cr rose but he looked dehydrated on his labs and suspect he was fasting for the cholesterol. His cholesterol looks excellent. Would repeat BMET in 10-14 days and ensure he is well hydrated on that day to ensure his Cr is better. If not, we will likely need to adjust his HCTZ

## 2022-08-24 ENCOUNTER — Other Ambulatory Visit: Payer: Self-pay | Admitting: Oncology

## 2022-08-24 DIAGNOSIS — Z006 Encounter for examination for normal comparison and control in clinical research program: Secondary | ICD-10-CM

## 2022-08-25 ENCOUNTER — Ambulatory Visit: Payer: Commercial Managed Care - PPO | Attending: Cardiology

## 2022-08-25 DIAGNOSIS — Z79899 Other long term (current) drug therapy: Secondary | ICD-10-CM

## 2022-08-25 DIAGNOSIS — I10 Essential (primary) hypertension: Secondary | ICD-10-CM

## 2022-08-26 ENCOUNTER — Encounter: Payer: Self-pay | Admitting: Cardiology

## 2022-08-26 ENCOUNTER — Other Ambulatory Visit (HOSPITAL_COMMUNITY): Payer: Self-pay

## 2022-08-26 ENCOUNTER — Other Ambulatory Visit: Payer: Self-pay

## 2022-08-26 DIAGNOSIS — R7989 Other specified abnormal findings of blood chemistry: Secondary | ICD-10-CM

## 2022-08-26 DIAGNOSIS — I10 Essential (primary) hypertension: Secondary | ICD-10-CM

## 2022-08-26 DIAGNOSIS — Z79899 Other long term (current) drug therapy: Secondary | ICD-10-CM

## 2022-08-26 LAB — BASIC METABOLIC PANEL
BUN/Creatinine Ratio: 16 (ref 10–24)
BUN: 24 mg/dL (ref 8–27)
CO2: 25 mmol/L (ref 20–29)
Calcium: 10 mg/dL (ref 8.6–10.2)
Chloride: 97 mmol/L (ref 96–106)
Creatinine, Ser: 1.46 mg/dL — ABNORMAL HIGH (ref 0.76–1.27)
Glucose: 105 mg/dL — ABNORMAL HIGH (ref 70–99)
Potassium: 3.5 mmol/L (ref 3.5–5.2)
Sodium: 139 mmol/L (ref 134–144)
eGFR: 53 mL/min/{1.73_m2} — ABNORMAL LOW (ref >59)

## 2022-08-29 ENCOUNTER — Other Ambulatory Visit (HOSPITAL_COMMUNITY): Payer: Self-pay

## 2022-08-29 NOTE — Telephone Encounter (Signed)
Pt will increase po water intake and hold hydrochlorothiazide for 2 weeks, as advised by Dr. Izora Ribas in this message.  He will come in for repeat BMET in 2 weeks to reassess on 09/13/22.  Pt aware of this plan and agreed via [employer].

## 2022-08-29 NOTE — Addendum Note (Signed)
Addended by: Loa Socks on: 08/29/2022 12:44 PM   Modules accepted: Orders

## 2022-09-02 ENCOUNTER — Other Ambulatory Visit (HOSPITAL_COMMUNITY): Payer: Self-pay

## 2022-09-05 ENCOUNTER — Other Ambulatory Visit (HOSPITAL_COMMUNITY): Payer: Self-pay

## 2022-09-06 ENCOUNTER — Other Ambulatory Visit (HOSPITAL_COMMUNITY): Payer: Self-pay

## 2022-09-13 ENCOUNTER — Ambulatory Visit: Payer: Commercial Managed Care - PPO | Attending: Cardiology

## 2022-09-13 DIAGNOSIS — I10 Essential (primary) hypertension: Secondary | ICD-10-CM | POA: Diagnosis not present

## 2022-09-13 DIAGNOSIS — R7989 Other specified abnormal findings of blood chemistry: Secondary | ICD-10-CM

## 2022-09-13 DIAGNOSIS — Z79899 Other long term (current) drug therapy: Secondary | ICD-10-CM | POA: Diagnosis not present

## 2022-09-20 ENCOUNTER — Encounter (INDEPENDENT_AMBULATORY_CARE_PROVIDER_SITE_OTHER): Payer: Commercial Managed Care - PPO | Admitting: Cardiology

## 2022-09-20 ENCOUNTER — Telehealth: Payer: Self-pay

## 2022-09-20 DIAGNOSIS — E7800 Pure hypercholesterolemia, unspecified: Secondary | ICD-10-CM | POA: Diagnosis not present

## 2022-09-20 DIAGNOSIS — Z79899 Other long term (current) drug therapy: Secondary | ICD-10-CM

## 2022-09-20 DIAGNOSIS — R7989 Other specified abnormal findings of blood chemistry: Secondary | ICD-10-CM

## 2022-09-20 NOTE — Telephone Encounter (Signed)
Referral placed.

## 2022-09-20 NOTE — Telephone Encounter (Signed)
-----   Message from Christell Constant sent at 09/18/2022  3:50 PM EDT ----- Results: Nominal change to creatinine with hydration and hydrochlorothiazide stop Plan: CKD stage 3A suspected; results to PCP and offer nephrology referral  Christell Constant, MD

## 2022-09-21 NOTE — Telephone Encounter (Signed)
Please see the MyChart message reply(ies) for my assessment and plan.   Appreciate the update.  Your LDL cholesterol is excellent.  I would like to try Crestor 5 mg once a day instead of 10 and see if this makes a difference.  Often, dose adjustments relieve patient's of their symptoms.  This patient gave consent for this Medical Advice Message and is aware that it may result in a bill to [employer], as well as the possibility of receiving a bill for a co-payment or deductible. They are an established patient, but are not seeking medical advice exclusively about a problem treated during an in person or video visit in the last seven days. I did not recommend an in person or video visit within seven days of my reply.    I spent a total of 5 minutes cumulative time within 7 days through [employer].  Donato Schultz, MD

## 2022-09-22 ENCOUNTER — Other Ambulatory Visit (HOSPITAL_COMMUNITY): Payer: Self-pay

## 2022-09-22 ENCOUNTER — Other Ambulatory Visit: Payer: Self-pay | Admitting: *Deleted

## 2022-09-22 ENCOUNTER — Other Ambulatory Visit: Payer: Self-pay

## 2022-09-22 DIAGNOSIS — I10 Essential (primary) hypertension: Secondary | ICD-10-CM

## 2022-09-22 DIAGNOSIS — I25118 Atherosclerotic heart disease of native coronary artery with other forms of angina pectoris: Secondary | ICD-10-CM

## 2022-09-22 DIAGNOSIS — Z79899 Other long term (current) drug therapy: Secondary | ICD-10-CM

## 2022-09-22 DIAGNOSIS — E7800 Pure hypercholesterolemia, unspecified: Secondary | ICD-10-CM

## 2022-09-22 DIAGNOSIS — G4733 Obstructive sleep apnea (adult) (pediatric): Secondary | ICD-10-CM

## 2022-09-22 DIAGNOSIS — R072 Precordial pain: Secondary | ICD-10-CM

## 2022-09-22 MED ORDER — ROSUVASTATIN CALCIUM 5 MG PO TABS
5.0000 mg | ORAL_TABLET | Freq: Every day | ORAL | 3 refills | Status: DC
  Filled 2022-09-22: qty 90, 90d supply, fill #0
  Filled 2023-04-01: qty 90, 90d supply, fill #1
  Filled 2023-06-30: qty 90, 90d supply, fill #2
  Filled 2023-09-20: qty 90, 90d supply, fill #3

## 2022-09-27 ENCOUNTER — Other Ambulatory Visit (HOSPITAL_COMMUNITY)
Admission: RE | Admit: 2022-09-27 | Discharge: 2022-09-27 | Disposition: A | Discharge status: Home / Self Care | Payer: Commercial Managed Care - PPO | Source: Ambulatory Visit | Attending: Oncology | Admitting: Oncology

## 2022-09-27 DIAGNOSIS — Z006 Encounter for examination for normal comparison and control in clinical research program: Secondary | ICD-10-CM | POA: Insufficient documentation

## 2022-09-29 DIAGNOSIS — G4733 Obstructive sleep apnea (adult) (pediatric): Secondary | ICD-10-CM | POA: Diagnosis not present

## 2022-09-30 DIAGNOSIS — G4733 Obstructive sleep apnea (adult) (pediatric): Secondary | ICD-10-CM | POA: Diagnosis not present

## 2022-10-06 LAB — HELIX MOLECULAR SCREEN: Genetic Analysis Overall Interpretation: NEGATIVE

## 2022-10-30 DIAGNOSIS — G4733 Obstructive sleep apnea (adult) (pediatric): Secondary | ICD-10-CM | POA: Diagnosis not present

## 2022-10-31 DIAGNOSIS — G4733 Obstructive sleep apnea (adult) (pediatric): Secondary | ICD-10-CM | POA: Diagnosis not present

## 2022-11-02 ENCOUNTER — Other Ambulatory Visit (HOSPITAL_COMMUNITY): Payer: Self-pay

## 2022-11-15 ENCOUNTER — Other Ambulatory Visit (HOSPITAL_COMMUNITY): Payer: Self-pay

## 2022-11-15 DIAGNOSIS — N1831 Chronic kidney disease, stage 3a: Secondary | ICD-10-CM | POA: Diagnosis not present

## 2022-11-15 DIAGNOSIS — I129 Hypertensive chronic kidney disease with stage 1 through stage 4 chronic kidney disease, or unspecified chronic kidney disease: Secondary | ICD-10-CM | POA: Diagnosis not present

## 2022-11-15 DIAGNOSIS — Z87442 Personal history of urinary calculi: Secondary | ICD-10-CM | POA: Diagnosis not present

## 2022-11-15 MED ORDER — OLMESARTAN MEDOXOMIL 20 MG PO TABS
20.0000 mg | ORAL_TABLET | Freq: Every day | ORAL | 6 refills | Status: DC
Start: 2022-11-15 — End: 2023-06-12
  Filled 2022-11-15: qty 30, 30d supply, fill #0

## 2022-11-16 ENCOUNTER — Other Ambulatory Visit: Payer: Self-pay | Admitting: Nephrology

## 2022-11-16 DIAGNOSIS — N1831 Chronic kidney disease, stage 3a: Secondary | ICD-10-CM

## 2022-11-17 ENCOUNTER — Ambulatory Visit
Admission: RE | Admit: 2022-11-17 | Discharge: 2022-11-17 | Disposition: A | Discharge status: Home / Self Care | Payer: Commercial Managed Care - PPO | Source: Ambulatory Visit | Attending: Nephrology | Admitting: Nephrology

## 2022-11-17 DIAGNOSIS — N189 Chronic kidney disease, unspecified: Secondary | ICD-10-CM | POA: Diagnosis not present

## 2022-11-17 DIAGNOSIS — N1831 Chronic kidney disease, stage 3a: Secondary | ICD-10-CM

## 2022-11-30 DIAGNOSIS — G4733 Obstructive sleep apnea (adult) (pediatric): Secondary | ICD-10-CM | POA: Diagnosis not present

## 2022-12-02 ENCOUNTER — Other Ambulatory Visit (HOSPITAL_COMMUNITY): Payer: Self-pay

## 2022-12-02 MED ORDER — OLMESARTAN MEDOXOMIL 40 MG PO TABS
40.0000 mg | ORAL_TABLET | Freq: Every day | ORAL | 6 refills | Status: DC
  Filled 2022-12-02: qty 30, 30d supply, fill #0
  Filled 2022-12-29: qty 30, 30d supply, fill #1
  Filled 2023-02-03: qty 30, 30d supply, fill #2
  Filled 2023-02-26 – 2023-02-28 (×2): qty 30, 30d supply, fill #3
  Filled 2023-04-01: qty 30, 30d supply, fill #4
  Filled 2023-05-02: qty 30, 30d supply, fill #5
  Filled 2023-05-29: qty 30, 30d supply, fill #6

## 2022-12-21 ENCOUNTER — Ambulatory Visit: Payer: Commercial Managed Care - PPO | Admitting: Cardiology

## 2022-12-30 DIAGNOSIS — G4733 Obstructive sleep apnea (adult) (pediatric): Secondary | ICD-10-CM | POA: Diagnosis not present

## 2023-01-16 ENCOUNTER — Ambulatory Visit: Payer: Commercial Managed Care - PPO | Admitting: Cardiology

## 2023-01-16 DIAGNOSIS — N1831 Chronic kidney disease, stage 3a: Secondary | ICD-10-CM | POA: Diagnosis not present

## 2023-01-29 DIAGNOSIS — G4733 Obstructive sleep apnea (adult) (pediatric): Secondary | ICD-10-CM | POA: Diagnosis not present

## 2023-02-16 DIAGNOSIS — N1831 Chronic kidney disease, stage 3a: Secondary | ICD-10-CM | POA: Diagnosis not present

## 2023-02-23 DIAGNOSIS — Z87442 Personal history of urinary calculi: Secondary | ICD-10-CM | POA: Diagnosis not present

## 2023-02-23 DIAGNOSIS — I129 Hypertensive chronic kidney disease with stage 1 through stage 4 chronic kidney disease, or unspecified chronic kidney disease: Secondary | ICD-10-CM | POA: Diagnosis not present

## 2023-02-23 DIAGNOSIS — R7989 Other specified abnormal findings of blood chemistry: Secondary | ICD-10-CM | POA: Diagnosis not present

## 2023-02-27 ENCOUNTER — Other Ambulatory Visit (HOSPITAL_COMMUNITY): Payer: Self-pay

## 2023-03-02 DIAGNOSIS — L821 Other seborrheic keratosis: Secondary | ICD-10-CM | POA: Diagnosis not present

## 2023-03-02 DIAGNOSIS — D225 Melanocytic nevi of trunk: Secondary | ICD-10-CM | POA: Diagnosis not present

## 2023-03-02 DIAGNOSIS — L820 Inflamed seborrheic keratosis: Secondary | ICD-10-CM | POA: Diagnosis not present

## 2023-03-02 DIAGNOSIS — L538 Other specified erythematous conditions: Secondary | ICD-10-CM | POA: Diagnosis not present

## 2023-03-02 DIAGNOSIS — L814 Other melanin hyperpigmentation: Secondary | ICD-10-CM | POA: Diagnosis not present

## 2023-03-06 ENCOUNTER — Ambulatory Visit (HOSPITAL_BASED_OUTPATIENT_CLINIC_OR_DEPARTMENT_OTHER): Payer: Commercial Managed Care - PPO | Admitting: Cardiology

## 2023-03-06 VITALS — BP 118/72 | HR 74 | Ht 70.0 in | Wt 305.0 lb

## 2023-03-06 DIAGNOSIS — I25118 Atherosclerotic heart disease of native coronary artery with other forms of angina pectoris: Secondary | ICD-10-CM | POA: Diagnosis not present

## 2023-03-06 DIAGNOSIS — I10 Essential (primary) hypertension: Secondary | ICD-10-CM | POA: Diagnosis not present

## 2023-03-06 DIAGNOSIS — G4733 Obstructive sleep apnea (adult) (pediatric): Secondary | ICD-10-CM

## 2023-03-06 DIAGNOSIS — E7800 Pure hypercholesterolemia, unspecified: Secondary | ICD-10-CM | POA: Diagnosis not present

## 2023-03-06 NOTE — Patient Instructions (Addendum)
Medication Instructions:  Your physician recommends that you continue on your current medications as directed. Please refer to the Current Medication list given to you today.  *If you need a refill on your cardiac medications before your next appointment, please call your pharmacy*  Lab Work: NONE   Testing/Procedures: NONE  Follow-Up: At [facility], you and your health needs are our priority.  As part of our continuing mission to provide you with exceptional heart care, we have created designated Provider Care Teams.  These Care Teams include your primary Cardiologist (physician) and Advanced Practice Providers (APPs -  Physician Assistants and Nurse Practitioners) who all work together to provide you with the care you need, when you need it.  We recommend signing up for the patient portal called "MyChart".  Sign up information is provided on this After Visit Summary.  MyChart is used to connect with patients for Virtual Visits (Telemedicine).  Patients are able to view lab/test results, encounter notes, upcoming appointments, etc.  Non-urgent messages can be sent to your provider as well.   To learn more about what you can do with MyChart, go to [email or web address].    Your next appointment:   12 month(s)  Provider:   DR Anne Fu   Have sent a message to our pharmacy team about getting you started on Zepbound

## 2023-03-06 NOTE — Progress Notes (Signed)
Cardiology Office Note:  .   Date:  03/06/2023  ID:  Adam Avila, DOB Dec 07, 1960, MRN [id number] PCP: Daisy Floro, MD  [facility] Providers Cardiologist:  Donato Schultz, MD    History of Present Illness: .   Adam Avila is a 63 y.o. male Discussed with the use of AI scribe   History of Present Illness   A 63 year old patient with a history of coronary artery calcification, hypertension, and obstructive sleep apnea managed with CPAP, presents for follow-up. The patient underwent a coronary calcium score test on 06/08/2022, which revealed an elevated score of 383 (81st percentile), prompting a cardiology referral. The patient reported experiencing chest fullness during exertion and dyspnea on exertion. An EKG showed normal sinus rhythm with LVH and a heart rate of 68 beats per minute.  A subsequent coronary CT scan on 06/24/2022 revealed minimal coronary disease with stenosis less than 25% in the RCA and left circumflex, and mild mixed density plaque of 25-49% in the proximal RCA. The total plaque volume was 383, and the recalculated calcium score was 508 (85th percentile). As a result, the patient was started on aspirin 81 mg and Crestor. The patient reports tolerating 5 mg of Crestor daily.  The patient has a history of kidney stones and was diagnosed with a large kidney stone (4-5 cm) two years ago, which required percutaneous nephrolithotomy. Recent lab results indicate a kidney function of 55%, and the patient has been categorized as having early-stage kidney failure.  The patient also reports struggling with weight gain and has been trying different strategies, including intermittent fasting, to manage his weight.  The patient has been adherent to his medication regimen, which includes olmesartan for hypertension. He reports no significant side effects from his current medications. The patient is motivated to improve his health, particularly as he approaches retirement in a  year.      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       Studies Reviewed: .        Results   LABS B12: Low Testosterone: Low Kidney function: 55% LDL: 48  RADIOLOGY Coronary calcium score: 383, 81st percentile (06/04/2022) Coronary CT scan: Minimal coronary disease, stenosis <25% in RCA and left circumflex, mild mixed density plaque 25-49% in proximal RCA, total plaque volume 383, calcium score 508, 85th percentile (06/24/2022)  DIAGNOSTIC EKG: Normal sinus rhythm with left ventricular hypertrophy (LVH), heart rate 68 bpm     Risk Assessment/Calculations:            Physical Exam:   VS:  BP 118/72 (BP Location: Left Arm, Patient Position: Sitting, Cuff Size: Large)   Pulse 74   Ht 5\' 10"  (1.778 m)   Wt (!) 305 lb (138.3 kg)   SpO2 95%   BMI 43.76 kg/m    Wt Readings from Last 3 Encounters:  03/06/23 (!) 305 lb (138.3 kg)  06/16/22 294 lb 12.8 oz (133.7 kg)  12/30/20 288 lb 4 oz (130.7 kg)    GEN: Well nourished, well developed in no acute distress NECK: No JVD; No carotid bruits CARDIAC: RRR, no murmurs, no rubs, no gallops RESPIRATORY:  Clear to auscultation without rales, wheezing or rhonchi  ABDOMEN: Soft, non-tender, non-distended EXTREMITIES:  No edema; No deformity   ASSESSMENT AND PLAN: .    Assessment and Plan    Coronary Artery Disease (CAD) Elevated coronary calcium score of 383 (81st percentile) on 06/04/2022, prompting cardiology referral. Coronary CT scan on 06/24/2022 showed minimal coronary disease  with stenosis <25% in RCA and left circumflex, and mild mixed density plaque (25-49%) in proximal RCA. Total plaque volume was 383, and calcium score recalculated to 508 (85th percentile). Currently on aspirin 81 mg and Crestor 5 mg daily. LDL is 48, which is well-controlled. Discussed the risks and benefits of continuing current medications, including the potential for reducing cardiovascular events and the importance of maintaining low LDL levels.  Patient prefers to continue current regimen. - Continue aspirin 81 mg daily - Continue Crestor 5 mg daily  Hypertension Long-standing hypertension managed with olmesartan 40 mg, which has been effective in controlling blood pressure. Discussed the importance of maintaining blood pressure control to reduce cardiovascular risks. - Continue olmesartan 40 mg daily  Obstructive Sleep Apnea (OSA) OSA managed with CPAP for the past 8 years. Discussed the benefits of continued CPAP use in reducing cardiovascular risks and improving overall health. - Continue CPAP therapy  Chronic Kidney Disease (CKD) Early-stage CKD with 55% kidney function, monitored by nephrologist. Recent checkup indicates well-managed condition. Discussed the importance of maintaining hydration and regular monitoring to prevent further decline in kidney function. - Continue monitoring kidney function - Maintain hydration with 80 ounces of water daily  Morbid Obesity Current weight is 305 lbs. Advised on weight loss strategies including intermittent fasting and Mediterranean diet. Discussed potential use of Zepbound for weight management, considering comorbidities including CAD, OSA, and obesity. Explained that Zepbound may help with weight loss and improve comorbid conditions, but insurance coverage is uncertain. Patient is motivated to try Zepbound as an additional tool for weight management. - Message pharmacy team to explore Zepbound approval - Continue intermittent fasting (16-hour fast) - Adopt Mediterranean diet with focus on cleaner foods and more vegetables  General Health Maintenance Patient is motivated to improve overall health and is considering retirement in a year. Emphasis on maintaining a healthy lifestyle through regular exercise and dietary modifications. - Encourage regular exercise - Monitor dietary intake and focus on low-carb, low-fat foods  Follow-up - One-year follow-up appointment - Message pharmacy  team regarding Zepbound approval.               Signed, Donato Schultz, MD

## 2023-03-07 ENCOUNTER — Other Ambulatory Visit (HOSPITAL_BASED_OUTPATIENT_CLINIC_OR_DEPARTMENT_OTHER): Payer: Self-pay

## 2023-03-07 ENCOUNTER — Other Ambulatory Visit (HOSPITAL_COMMUNITY): Payer: Self-pay

## 2023-03-13 ENCOUNTER — Encounter (HOSPITAL_BASED_OUTPATIENT_CLINIC_OR_DEPARTMENT_OTHER): Payer: Self-pay | Admitting: *Deleted

## 2023-03-14 DIAGNOSIS — G4733 Obstructive sleep apnea (adult) (pediatric): Secondary | ICD-10-CM | POA: Diagnosis not present

## 2023-03-15 DIAGNOSIS — R7301 Impaired fasting glucose: Secondary | ICD-10-CM | POA: Diagnosis not present

## 2023-03-15 DIAGNOSIS — M179 Osteoarthritis of knee, unspecified: Secondary | ICD-10-CM | POA: Diagnosis not present

## 2023-03-15 DIAGNOSIS — G4733 Obstructive sleep apnea (adult) (pediatric): Secondary | ICD-10-CM | POA: Diagnosis not present

## 2023-03-15 DIAGNOSIS — E291 Testicular hypofunction: Secondary | ICD-10-CM | POA: Diagnosis not present

## 2023-03-15 DIAGNOSIS — I10 Essential (primary) hypertension: Secondary | ICD-10-CM | POA: Diagnosis not present

## 2023-03-15 DIAGNOSIS — E7800 Pure hypercholesterolemia, unspecified: Secondary | ICD-10-CM | POA: Diagnosis not present

## 2023-04-14 DIAGNOSIS — G4733 Obstructive sleep apnea (adult) (pediatric): Secondary | ICD-10-CM | POA: Diagnosis not present

## 2023-04-26 ENCOUNTER — Other Ambulatory Visit (HOSPITAL_COMMUNITY): Payer: Self-pay

## 2023-04-26 ENCOUNTER — Ambulatory Visit
Admission: EM | Admit: 2023-04-26 | Discharge: 2023-04-26 | Disposition: A | Discharge status: Home / Self Care | Attending: Family Medicine | Admitting: Family Medicine

## 2023-04-26 DIAGNOSIS — A084 Viral intestinal infection, unspecified: Secondary | ICD-10-CM

## 2023-04-26 DIAGNOSIS — R112 Nausea with vomiting, unspecified: Secondary | ICD-10-CM

## 2023-04-26 LAB — POCT INFLUENZA A/B
Influenza A, POC: NEGATIVE
Influenza B, POC: NEGATIVE

## 2023-04-26 MED ORDER — ONDANSETRON 4 MG PO TBDP
4.0000 mg | ORAL_TABLET | Freq: Three times a day (TID) | ORAL | 0 refills | Status: AC | PRN
Start: 2023-04-26 — End: ?
  Filled 2023-04-26: qty 8, 3d supply, fill #0

## 2023-04-26 MED ORDER — ONDANSETRON 4 MG PO TBDP
4.0000 mg | ORAL_TABLET | Freq: Once | ORAL | Status: AC
  Administered 2023-04-26: 4 mg via ORAL

## 2023-04-26 NOTE — Discharge Instructions (Signed)
 You may take Zofran every 8 hours as needed for nausea or vomiting.  You were given a dose while in the clinic.  Focus on hydration/electrolyte replacement with Gatorade, Powerade, Pedialyte, water.  Do a bland diet and advance as you tolerate.  Lots of rest.  Follow-up with your PCP if your symptoms do not improve.  Please go to the ER for any worsening symptoms.  Hope you feel better soon!

## 2023-04-26 NOTE — ED Triage Notes (Signed)
 Pt presents with c.o vomiting and diarrhea that started this morning. C/o stomach aching and body aches, denies fever. Pt states he feels nauseas and has had multiple episodes of diarrhea.

## 2023-04-26 NOTE — ED Provider Notes (Signed)
 [facility]    CSN: [id number] Arrival date & time: 04/26/23  1108      History   Chief Complaint Chief Complaint  Patient presents with   Vomiting   Diarrhea    HPI Adam Avila is a 63 y.o. male  presents for evaluation of URI symptoms for 1 days. Patient reports associated symptoms of nausea/vomiting/diarrhea. Denies fevers, cough, congestion, sore throat, shortness of breath.  No history of GI diagnoses such as Crohn's, IBS, colitis.  No recent travel.  Does report he was around his grandkids that may have had the flu.  Pt has taken nothing OTC for symptoms. Pt has no other concerns at this time.    Diarrhea Associated symptoms: vomiting     Past Medical History:  Diagnosis Date   Allergic rhinitis    Arthritis    "back" (12/29/2014)   BPH (benign prostatic hyperplasia)    Class 3 severe obesity due to excess calories with serious comorbidity and body mass index (BMI) of 40.0 to 44.9 in adult [facility])    DDD (degenerative disc disease), lumbar    Diverticulosis    ED (erectile dysfunction)    Edema    Elevated coronary artery calcium score    Elevated fasting glucose    Exertional dyspnea    Fatigue    High cholesterol    History of kidney stones    HNP (herniated nucleus pulposus), lumbar    l4-5   HTN (hypertension)    Hypertension    Kidney stone    Leg fracture, right 1979   Leg fracture, right 1967   Liver cyst    Low testosterone    OA (osteoarthritis) of knee    Primary localized osteoarthritis of left knee 12/29/2014   Renal calculus    Sleep apnea    CPAP   Snoring    Vitamin B12 deficiency     Patient Active Problem List   Diagnosis Date Noted   Renal calculus 12/30/2020   Primary localized osteoarthritis of left knee 12/29/2014   DJD (degenerative joint disease) of knee 12/29/2014   Tear of lateral meniscus of left knee 12/05/2013   Primary osteoarthritis of left knee 11/07/2013   Right-sided low back pain with right-sided  sciatica 11/07/2013   MUSCLE STRAIN, LEFT CALF 10/07/2009   HERNIATED LUMBAR DISK WITH RADICULOPATHY 08/26/2008   ADJ DISORDER WITH MIXED ANXIETY & DEPRESSED MOOD 07/30/2008    Past Surgical History:  Procedure Laterality Date   ADENOIDECTOMY  ~ 1970   "not tonsils"   COLONOSCOPY     CYSTOSCOPY W/ STONE MANIPULATION  ~ 2002   LASIK Bilateral 2008   LITHOTRIPSY  X 1   NEPHROLITHOTOMY Left 12/30/2020   Procedure: LEFT NEPHROLITHOTOMY PERCUTANEOUS WTH  SURGEON ACCESS;  Surgeon: Noel Christmas, MD;  Location: [facility];  Service: Urology;  Laterality: Left;   TOTAL KNEE ARTHROPLASTY Left 12/29/2014   TOTAL KNEE ARTHROPLASTY Left 12/29/2014   Procedure: LEFT TOTAL KNEE ARTHROPLASTY;  Surgeon: Salvatore Marvel, MD;  Location: [facility] OR;  Service: Orthopedics;  Laterality: Left;       Home Medications    Prior to Admission medications   Medication Sig Start Date End Date Taking? Authorizing Provider  ondansetron (ZOFRAN-ODT) 4 MG disintegrating tablet Take 1 tablet (4 mg total) by mouth every 8 (eight) hours as needed for nausea or vomiting. 04/26/23  Yes Radford Pax, NP  amoxicillin (AMOXIL) 250 MG capsule Take 1 capsule (250 mg total) by mouth every  6 (six) hours until gone 06/29/22     amoxicillin (AMOXIL) 500 MG capsule Take 4 capsules (2,000 mg total) by mouth 1 hour prior to appt 12/23/21     aspirin EC 81 MG tablet Take 81 mg by mouth daily. Swallow whole.    [provider]  celecoxib (CELEBREX) 200 MG capsule Take 1 capsule (200 mg total) by mouth daily with food. 05/12/22     cetirizine (ZYRTEC) 10 MG tablet Take 10 mg by mouth daily.    [provider]  cyclobenzaprine (FLEXERIL) 10 MG tablet Take 1 tablet (10 mg total) by mouth at bedtime as needed for 20 days. 10/13/20     fluticasone (FLONASE) 50 MCG/ACT nasal spray Place 2 sprays into both nostrils daily.    [provider]  hydrochlorothiazide (HYDRODIURIL) 25 MG tablet Take 1 tablet (25 mg total) by  mouth in the morning. 11/10/21     HYDROcodone-acetaminophen (NORCO/VICODIN) 5-325 MG tablet Take 1-2 tablets by mouth every 8 (eight) hours as needed. 07/22/21     losartan (COZAAR) 100 MG tablet Take 1 tablet (100 mg total) by mouth daily. 05/12/22     metoprolol tartrate (LOPRESSOR) 100 MG tablet Take 1 tablet (100 mg total) by mouth once for 1 dose. Take 90-120 minutes prior to scan. 06/16/22 06/17/22  Meriam Sprague, MD  montelukast (SINGULAIR) 10 MG tablet Take 1 tablet (10 mg total) by mouth daily. 05/12/22     Multiple Vitamins-Minerals (MENS 50+ MULTI VITAMIN/MIN) TABS Take 1 tablet by mouth daily.    [provider]  olmesartan (BENICAR) 20 MG tablet Take 1 tablet (20 mg total) by mouth daily. 11/15/22     olmesartan (BENICAR) 40 MG tablet Take 1 tablet (40 mg total) by mouth daily. 12/02/22     oxyCODONE (OXY IR/ROXICODONE) 5 MG immediate release tablet Take 1 tablet (5 mg total) by mouth every 4 (four) hours as needed for moderate pain. 12/31/20   Noel Christmas, MD  rosuvastatin (CRESTOR) 5 MG tablet Take 1 tablet (5 mg total) by mouth daily. 09/22/22   Jake Bathe, MD  sildenafil (VIAGRA) 100 MG tablet Take 1 tablet (100 mg total) by mouth as needed daily 05/07/21     sildenafil (VIAGRA) 100 MG tablet Take 1 tablet (100 mg total) by mouth daily as needed. 05/12/22     tadalafil (CIALIS) 5 MG tablet Take 1-2 tablets (5-10 mg total) by mouth every other day 01/21/21     tamsulosin (FLOMAX) 0.4 MG CAPS capsule Take 1 capsule (0.4 mg total) by mouth at bedtime. 12/31/20   Noel Christmas, MD  testosterone (ANDROGEL) 50 MG/5GM (1%) GEL Apply 1 packet (5 g) onto the skin of shoulder, upper arms or abdomen daily in the morning 05/17/21       Family History Family History  Problem Relation Age of Onset   Osteoarthritis Mother    Osteoarthritis Father    Hypertension Father    AAA (abdominal aortic aneurysm) Father    Diabetes Sister     Social History Social History    Tobacco Use   Smoking status: Never   Smokeless tobacco: Never  Vaping Use   Vaping status: Never Used  Substance Use Topics   Alcohol use: Yes    Alcohol/week: 4.0 standard drinks of alcohol    Types: 2 Glasses of wine, 2 Shots of liquor per week    Comment: 2 a week   Drug use: No     Allergies  Patient has no known allergies.   Review of Systems Review of Systems  Gastrointestinal:  Positive for diarrhea, nausea and vomiting.     Physical Exam Triage Vital Signs ED Triage Vitals  Encounter Vitals Group     BP 04/26/23 1129 131/84     Systolic BP Percentile --      Diastolic BP Percentile --      Pulse Rate 04/26/23 1129 91     Resp 04/26/23 1129 18     Temp 04/26/23 1129 99.1 F (37.3 C)     Temp Source 04/26/23 1129 Oral     SpO2 04/26/23 1129 94 %     Weight --      Height --      Head Circumference --      Peak Flow --      Pain Score 04/26/23 1122 7     Pain Loc --      Pain Education --      Exclude from Growth Chart --    No data found.  Updated Vital Signs BP 131/84 (BP Location: Left Arm)   Pulse 91   Temp 99.1 F (37.3 C) (Oral)   Resp 18   SpO2 94%   Visual Acuity Right Eye Distance:   Left Eye Distance:   Bilateral Distance:    Right Eye Near:   Left Eye Near:    Bilateral Near:     Physical Exam Vitals and nursing note reviewed.  Constitutional:      General: He is not in acute distress.    Appearance: Normal appearance. He is not ill-appearing or toxic-appearing.  HENT:     Head: Normocephalic and atraumatic.     Right Ear: Tympanic membrane and ear canal normal.     Left Ear: Tympanic membrane and ear canal normal.     Mouth/Throat:     Mouth: Mucous membranes are moist.  Eyes:     Pupils: Pupils are equal, round, and reactive to light.  Cardiovascular:     Rate and Rhythm: Normal rate and regular rhythm.     Heart sounds: Normal heart sounds.  Pulmonary:     Effort: Pulmonary effort is normal.     Breath sounds:  Normal breath sounds.  Abdominal:     Palpations: Abdomen is soft. There is no mass.     Tenderness: There is no abdominal tenderness. There is no guarding or rebound. Negative signs include Rovsing's sign and McBurney's sign.     Hernia: No hernia is present.  Musculoskeletal:     Cervical back: Normal range of motion and neck supple.  Lymphadenopathy:     Cervical: No cervical adenopathy.  Skin:    General: Skin is warm and dry.  Neurological:     General: No focal deficit present.     Mental Status: He is alert and oriented to person, place, and time.  Psychiatric:        Mood and Affect: Mood normal.        Behavior: Behavior normal.      UC Treatments / Results  Labs (all labs ordered are listed, but only abnormal results are displayed) Labs Reviewed  POCT INFLUENZA A/B    EKG   Radiology No results found.  Procedures Procedures (including critical care time)  Medications Ordered in UC Medications  ondansetron (ZOFRAN-ODT) disintegrating tablet 4 mg (4 mg Oral Given 04/26/23 1140)    Initial Impression / Assessment and Plan / UC Course  I have reviewed  the triage vital signs and the nursing notes.  Pertinent labs & imaging results that were available during my care of the patient were reviewed by me and considered in my medical decision making (see chart for details).     Reviewed exam and symptoms with patient.  No red flags.  Negative rapid flu.  Discussed viral enteritis and symptomatic treatment.  Rx Zofran sent to pharmacy and patient was given dose in clinic with improvement in nausea and vomiting.  Discussed hydration/electrolyte replacement and bland diet.  PCP follow-up if symptoms do not improve.  ER precautions reviewed and patient verbalized understanding. Final Clinical Impressions(s) / UC Diagnoses   Final diagnoses:  Nausea and vomiting, unspecified vomiting type  Viral gastroenteritis     Discharge Instructions      You may take Zofran  every 8 hours as needed for nausea or vomiting.  You were given a dose while in the clinic.  Focus on hydration/electrolyte replacement with Gatorade, Powerade, Pedialyte, water.  Do a bland diet and advance as you tolerate.  Lots of rest.  Follow-up with your PCP if your symptoms do not improve.  Please go to the ER for any worsening symptoms.  Hope you feel better soon!     ED Prescriptions     Medication Sig Dispense Auth. Provider   ondansetron (ZOFRAN-ODT) 4 MG disintegrating tablet Take 1 tablet (4 mg total) by mouth every 8 (eight) hours as needed for nausea or vomiting. 8 tablet Radford Pax, NP      PDMP not reviewed this encounter.   Radford Pax, NP 04/26/23 [phone]

## 2023-05-05 IMAGING — CT CT RENAL STONE PROTOCOL
2 of 3 series · 16 of 46 positions shown, 18 images · non-contrast
Comparison: None available at time dictation.

CLINICAL DATA: Left flank pain times several days. History of renal
stones.

EXAM:
CT ABDOMEN AND PELVIS WITHOUT CONTRAST
TECHNIQUE: Multidetector CT imaging of the abdomen and pelvis was performed
following the standard protocol without IV contrast.

[Series 3: lung bases · axial · 0.90mm/px · z∈[-173,-39]mm · 13 of 79 slices shown, 15 images]
[im 6/79  soft-tissue]
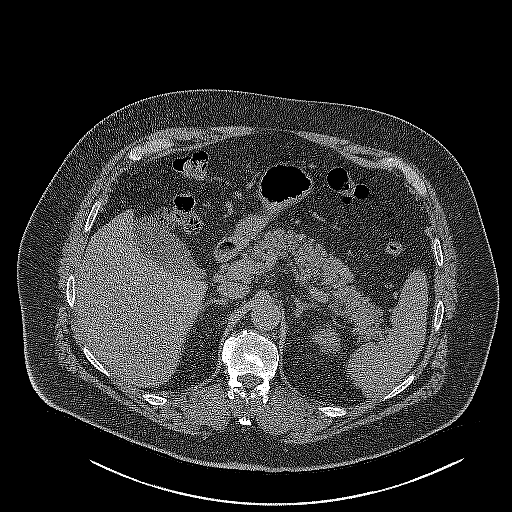
[im 6/79  bone]
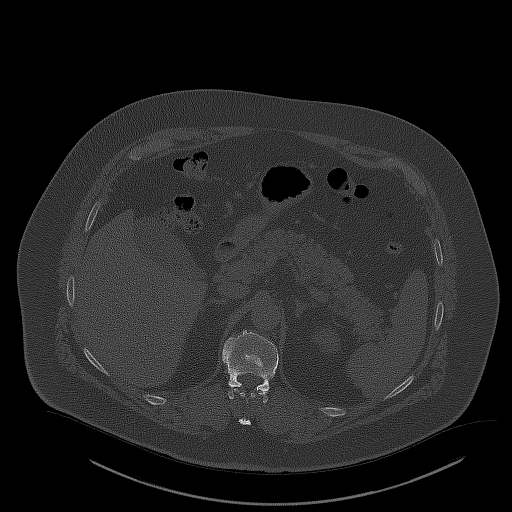
[im 11/79  soft-tissue]
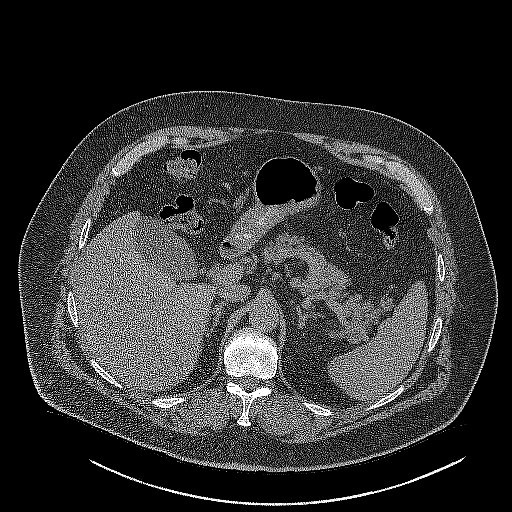
[im 16/79  soft-tissue]
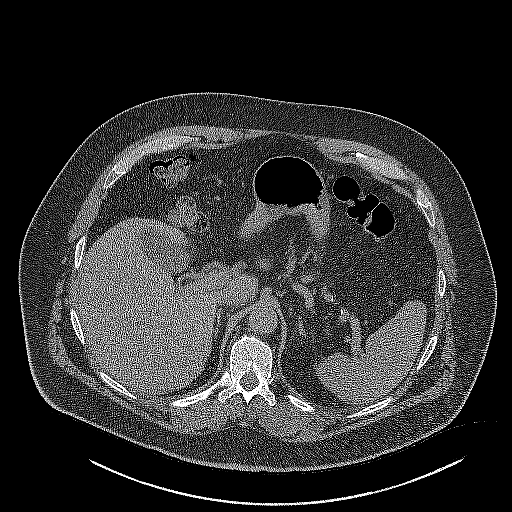
[im 23/79  soft-tissue]
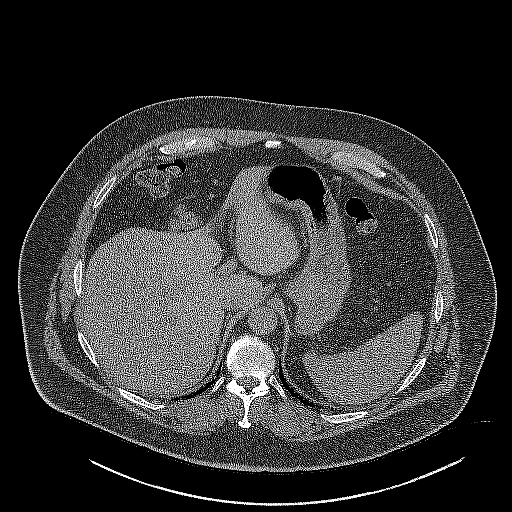
[im 28/79  soft-tissue]
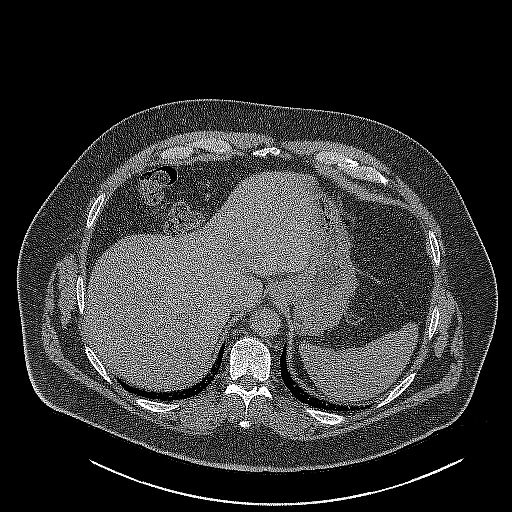
[im 33/79  soft-tissue]
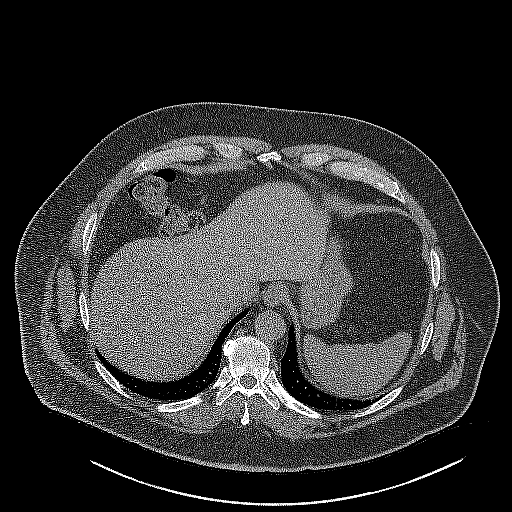
[im 41/79  soft-tissue]
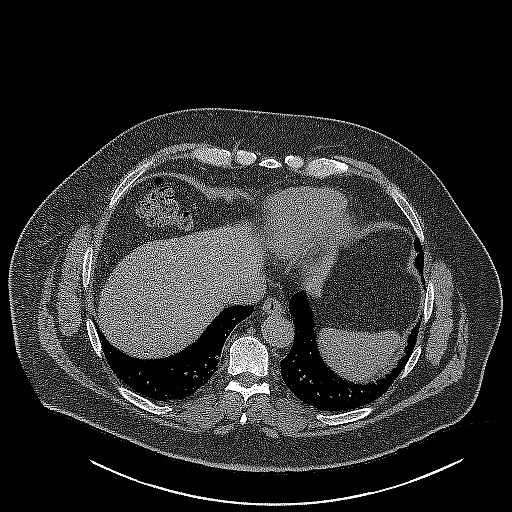
[im 46/79  soft-tissue]
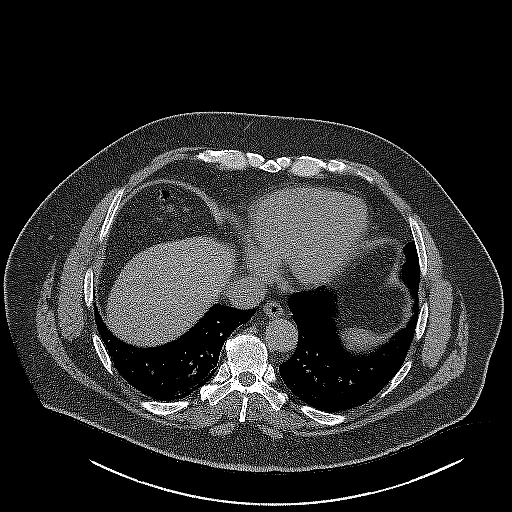
[im 51/79  soft-tissue]
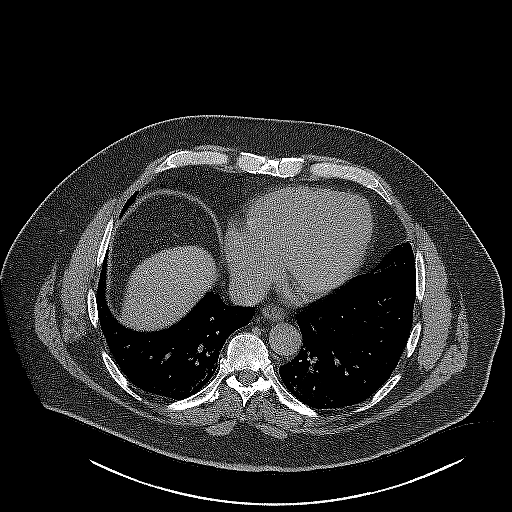
[im 51/79  bone]
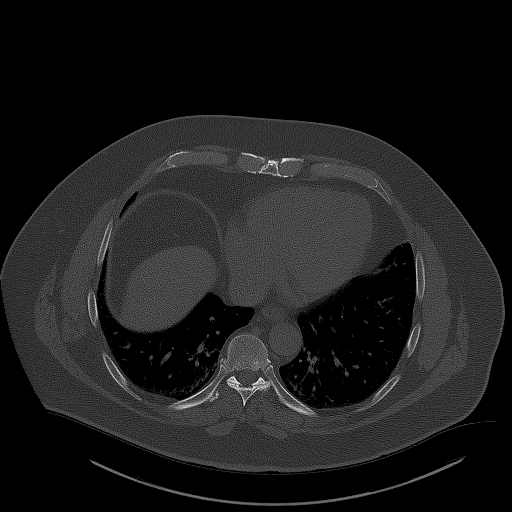
[im 56/79  soft-tissue]
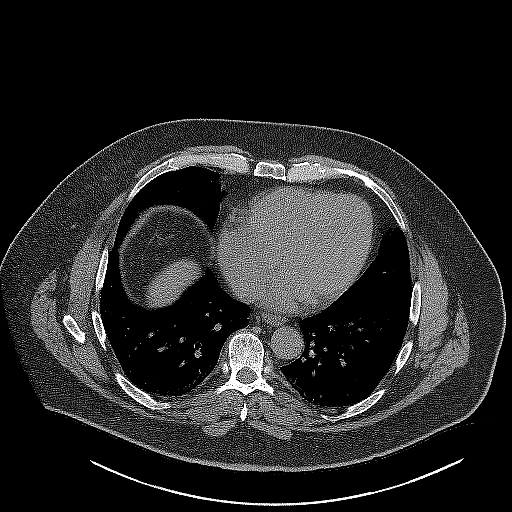
[im 63/79  soft-tissue]
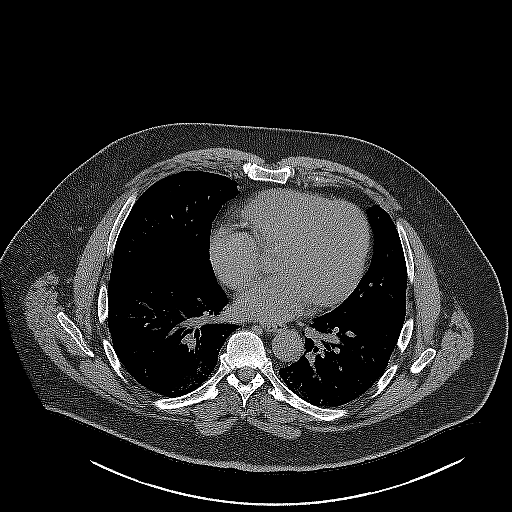
[im 68/79  soft-tissue]
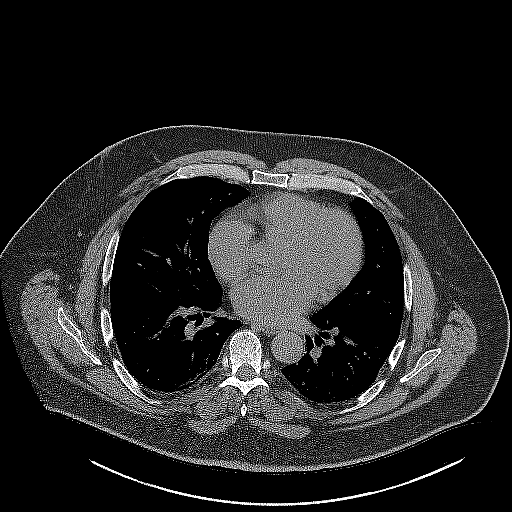
[im 73/79  soft-tissue]
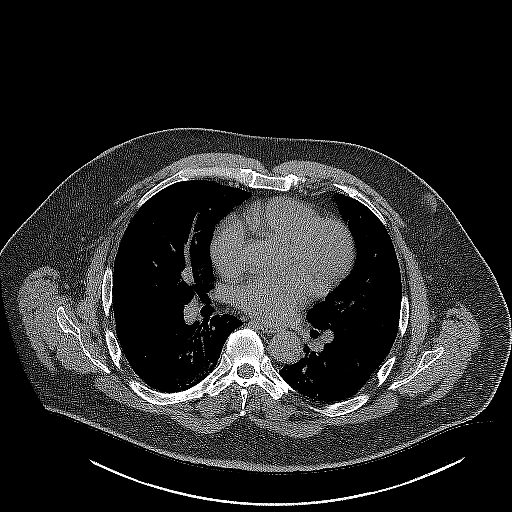

[Series 5: coronal · coronal · 0.94mm/px · 3 of 168 slices shown]
[im 56/168  soft-tissue]
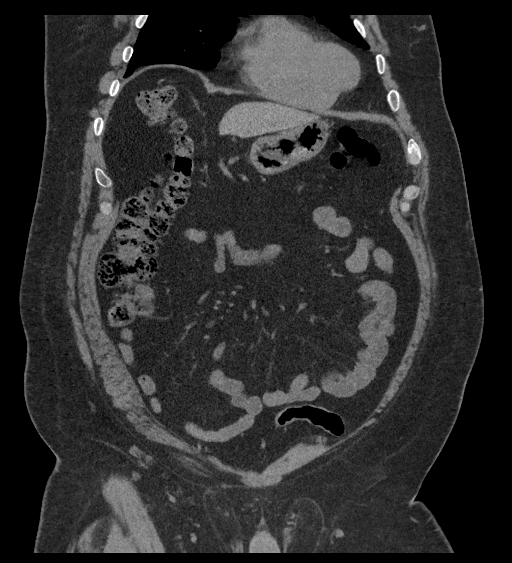
[im 75/168  soft-tissue]
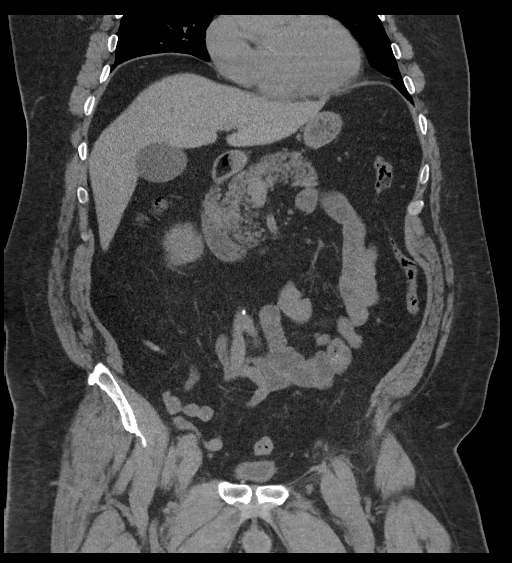
[im 93/168  soft-tissue]
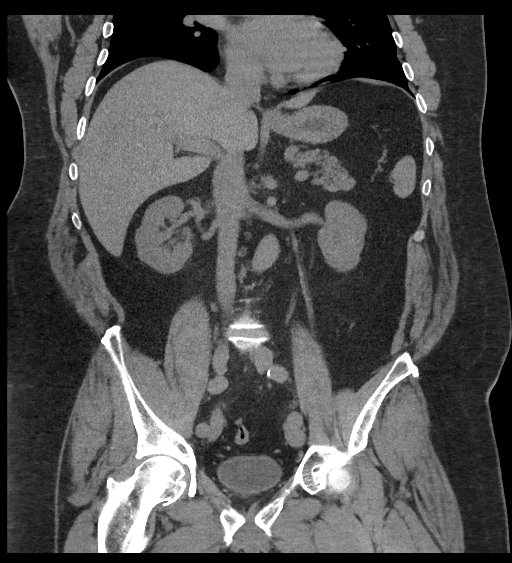

[16 of 46 positions shown; findings below may reference images not displayed]

FINDINGS: Lower chest: No acute abnormality.

Hepatobiliary: Scattered hypodense subcentimeter hepatic lesions
measuring up to 6 mm, incompletely characterized on this study but
likely reflecting cysts. Gallbladder is unremarkable. No biliary
ductal dilation.

Pancreas: No evidence of acute inflammation or pancreatic ductal
dilation.

Spleen: Within normal limits.

Adrenals/Urinary Tract: Bilateral adrenal glands are unremarkable.

Mild left hydronephrosis to the level of a 2.7 cm stone in the renal
pelvis. With additional left-sided nonobstructive renal stones the
largest of which measures 11 mm in the lower pole. No obstructive
ureteral or bladder calculi visualized.

Right kidney is unremarkable without hydronephrosis or
nephrolithiasis.

Urinary bladder is unremarkable for degree of distension.

Stomach/Bowel: Stomach is unremarkable for degree of distension. No
pathologic dilation of small or large bowel. The appendix and
terminal ileum appear normal. No evidence of acute bowel
inflammation.

Vascular/Lymphatic: Aortic and branch vessel atherosclerosis without
abdominal aortic aneurysm. No pathologically enlarged abdominal or
pelvic lymph nodes.

Reproductive: Dystrophic prostatic calcifications.

Other: No abdominopelvic ascites.

Musculoskeletal: Multilevel degenerative change of the spine.
Degenerative changes bilateral hips. No acute osseous abnormality.
IMPRESSION: 1. Mild left hydronephrosis to the level of a 2.7 cm stone in the
renal pelvis.
2. Additional nonobstructive left nephrolithiasis.
3.  Aortic Atherosclerosis ([id number].O).

These results will be called to the ordering clinician or
representative by the Radiologist Assistant, and communication
documented in the PACS or [REDACTED].

## 2023-05-12 DIAGNOSIS — G4733 Obstructive sleep apnea (adult) (pediatric): Secondary | ICD-10-CM | POA: Diagnosis not present

## 2023-05-19 ENCOUNTER — Other Ambulatory Visit (HOSPITAL_COMMUNITY): Payer: Self-pay

## 2023-05-19 DIAGNOSIS — R972 Elevated prostate specific antigen [PSA]: Secondary | ICD-10-CM | POA: Diagnosis not present

## 2023-05-19 DIAGNOSIS — E538 Deficiency of other specified B group vitamins: Secondary | ICD-10-CM | POA: Diagnosis not present

## 2023-05-19 DIAGNOSIS — I10 Essential (primary) hypertension: Secondary | ICD-10-CM | POA: Diagnosis not present

## 2023-05-19 DIAGNOSIS — J309 Allergic rhinitis, unspecified: Secondary | ICD-10-CM | POA: Diagnosis not present

## 2023-05-19 DIAGNOSIS — E7800 Pure hypercholesterolemia, unspecified: Secondary | ICD-10-CM | POA: Diagnosis not present

## 2023-05-19 DIAGNOSIS — Z125 Encounter for screening for malignant neoplasm of prostate: Secondary | ICD-10-CM | POA: Diagnosis not present

## 2023-05-19 DIAGNOSIS — R7301 Impaired fasting glucose: Secondary | ICD-10-CM | POA: Diagnosis not present

## 2023-05-19 DIAGNOSIS — M549 Dorsalgia, unspecified: Secondary | ICD-10-CM | POA: Diagnosis not present

## 2023-05-19 DIAGNOSIS — Z23 Encounter for immunization: Secondary | ICD-10-CM | POA: Diagnosis not present

## 2023-05-19 DIAGNOSIS — M2550 Pain in unspecified joint: Secondary | ICD-10-CM | POA: Diagnosis not present

## 2023-05-19 DIAGNOSIS — Z0000 Encounter for general adult medical examination without abnormal findings: Secondary | ICD-10-CM | POA: Diagnosis not present

## 2023-05-19 DIAGNOSIS — E291 Testicular hypofunction: Secondary | ICD-10-CM | POA: Diagnosis not present

## 2023-05-19 MED ORDER — SILDENAFIL CITRATE 100 MG PO TABS
100.0000 mg | ORAL_TABLET | Freq: Every day | ORAL | 5 refills | Status: DC | PRN
  Filled 2023-05-19: qty 30, 30d supply, fill #0

## 2023-05-19 MED ORDER — MONTELUKAST SODIUM 10 MG PO TABS
10.0000 mg | ORAL_TABLET | Freq: Every day | ORAL | 4 refills | Status: AC
  Filled 2023-08-07: qty 90, 90d supply, fill #0
  Filled 2023-11-11: qty 90, 90d supply, fill #1
  Filled 2024-02-03: qty 90, 90d supply, fill #2

## 2023-05-22 DIAGNOSIS — H524 Presbyopia: Secondary | ICD-10-CM | POA: Diagnosis not present

## 2023-05-23 DIAGNOSIS — M25551 Pain in right hip: Secondary | ICD-10-CM | POA: Diagnosis not present

## 2023-05-23 DIAGNOSIS — M545 Low back pain, unspecified: Secondary | ICD-10-CM | POA: Diagnosis not present

## 2023-05-25 ENCOUNTER — Ambulatory Visit (INDEPENDENT_AMBULATORY_CARE_PROVIDER_SITE_OTHER)

## 2023-05-25 ENCOUNTER — Other Ambulatory Visit (HOSPITAL_COMMUNITY): Payer: Self-pay

## 2023-05-25 ENCOUNTER — Ambulatory Visit
Admission: EM | Admit: 2023-05-25 | Discharge: 2023-05-25 | Disposition: A | Discharge status: Home / Self Care | Attending: Family Medicine | Admitting: Family Medicine

## 2023-05-25 DIAGNOSIS — R0602 Shortness of breath: Secondary | ICD-10-CM | POA: Diagnosis not present

## 2023-05-25 DIAGNOSIS — R051 Acute cough: Secondary | ICD-10-CM

## 2023-05-25 DIAGNOSIS — J0100 Acute maxillary sinusitis, unspecified: Secondary | ICD-10-CM | POA: Diagnosis not present

## 2023-05-25 DIAGNOSIS — R059 Cough, unspecified: Secondary | ICD-10-CM | POA: Diagnosis not present

## 2023-05-25 DIAGNOSIS — R0989 Other specified symptoms and signs involving the circulatory and respiratory systems: Secondary | ICD-10-CM | POA: Diagnosis not present

## 2023-05-25 MED ORDER — AMOXICILLIN-POT CLAVULANATE 875-125 MG PO TABS
1.0000 | ORAL_TABLET | Freq: Two times a day (BID) | ORAL | 0 refills | Status: AC
Start: 2023-05-25 — End: 2023-06-01
  Filled 2023-05-25: qty 14, 7d supply, fill #0

## 2023-05-25 MED ORDER — PROMETHAZINE-DM 6.25-15 MG/5ML PO SYRP
5.0000 mL | ORAL_SOLUTION | Freq: Every evening | ORAL | 0 refills | Status: AC | PRN
Start: 2023-05-25 — End: ?
  Filled 2023-05-25: qty 118, 23d supply, fill #0

## 2023-05-25 NOTE — ED Triage Notes (Signed)
 Pt states cough,congestion,body aches and sinus headache for the past week.   States he has been taking delsym and Tylenol cold at home with some relief.

## 2023-05-25 NOTE — ED Provider Notes (Signed)
 [facility]    CSN: [id number] Arrival date & time: 05/25/23  1442      History   Chief Complaint Chief Complaint  Patient presents with   Cough    HPI Adam Avila is a 63 y.o. male  presents for evaluation of URI symptoms for 7 days. Patient reports associated symptoms of cough, congestion, body aches, sinus pressure/pain with sinus headache, postnasal drip, shortness of breath. Denies N/V/D, fevers, ear pain, sore throat. Patient does not have a hx of asthma. Patient is not an active smoker.  Pt has taken Delsym, Mucinex, Tylenol OTC for symptoms. Pt has no other concerns at this time.    Cough Associated symptoms: headaches and shortness of breath     Past Medical History:  Diagnosis Date   Allergic rhinitis    Arthritis    "back" (12/29/2014)   BPH (benign prostatic hyperplasia)    Class 3 severe obesity due to excess calories with serious comorbidity and body mass index (BMI) of 40.0 to 44.9 in adult [facility])    DDD (degenerative disc disease), lumbar    Diverticulosis    ED (erectile dysfunction)    Edema    Elevated coronary artery calcium score    Elevated fasting glucose    Exertional dyspnea    Fatigue    High cholesterol    History of kidney stones    HNP (herniated nucleus pulposus), lumbar    l4-5   HTN (hypertension)    Hypertension    Kidney stone    Leg fracture, right 1979   Leg fracture, right 1967   Liver cyst    Low testosterone    OA (osteoarthritis) of knee    Primary localized osteoarthritis of left knee 12/29/2014   Renal calculus    Sleep apnea    CPAP   Snoring    Vitamin B12 deficiency     Patient Active Problem List   Diagnosis Date Noted   Renal calculus 12/30/2020   Primary localized osteoarthritis of left knee 12/29/2014   DJD (degenerative joint disease) of knee 12/29/2014   Tear of lateral meniscus of left knee 12/05/2013   Primary osteoarthritis of left knee 11/07/2013   Right-sided low back pain with  right-sided sciatica 11/07/2013   MUSCLE STRAIN, LEFT CALF 10/07/2009   HERNIATED LUMBAR DISK WITH RADICULOPATHY 08/26/2008   ADJ DISORDER WITH MIXED ANXIETY & DEPRESSED MOOD 07/30/2008    Past Surgical History:  Procedure Laterality Date   ADENOIDECTOMY  ~ 1970   "not tonsils"   COLONOSCOPY     CYSTOSCOPY W/ STONE MANIPULATION  ~ 2002   LASIK Bilateral 2008   LITHOTRIPSY  X 1   NEPHROLITHOTOMY Left 12/30/2020   Procedure: LEFT NEPHROLITHOTOMY PERCUTANEOUS WTH  SURGEON ACCESS;  Surgeon: Noel Christmas, MD;  Location: [facility];  Service: Urology;  Laterality: Left;   TOTAL KNEE ARTHROPLASTY Left 12/29/2014   TOTAL KNEE ARTHROPLASTY Left 12/29/2014   Procedure: LEFT TOTAL KNEE ARTHROPLASTY;  Surgeon: Salvatore Marvel, MD;  Location: [facility] OR;  Service: Orthopedics;  Laterality: Left;       Home Medications    Prior to Admission medications   Medication Sig Start Date End Date Taking? Authorizing Provider  amoxicillin-clavulanate (AUGMENTIN) 875-125 MG tablet Take 1 tablet by mouth every 12 (twelve) hours. 05/25/23  Yes Radford Pax, NP  promethazine-dextromethorphan (PROMETHAZINE-DM) 6.25-15 MG/5ML syrup Take 5 mLs by mouth at bedtime as needed for cough. 05/25/23  Yes Radford Pax, NP  aspirin  EC 81 MG tablet Take 81 mg by mouth daily. Swallow whole.    [provider]  celecoxib (CELEBREX) 200 MG capsule Take 1 capsule (200 mg total) by mouth daily with food. 05/12/22     cetirizine (ZYRTEC) 10 MG tablet Take 10 mg by mouth daily.    [provider]  cyclobenzaprine (FLEXERIL) 10 MG tablet Take 1 tablet (10 mg total) by mouth at bedtime as needed for 20 days. 10/13/20     fluticasone (FLONASE) 50 MCG/ACT nasal spray Place 2 sprays into both nostrils daily.    [provider]  hydrochlorothiazide (HYDRODIURIL) 25 MG tablet Take 1 tablet (25 mg total) by mouth in the morning. 11/10/21     HYDROcodone-acetaminophen (NORCO/VICODIN) 5-325 MG tablet Take 1-2 tablets  by mouth every 8 (eight) hours as needed. 07/22/21     losartan (COZAAR) 100 MG tablet Take 1 tablet (100 mg total) by mouth daily. 05/12/22     metoprolol tartrate (LOPRESSOR) 100 MG tablet Take 1 tablet (100 mg total) by mouth once for 1 dose. Take 90-120 minutes prior to scan. 06/16/22 06/17/22  Meriam Sprague, MD  montelukast (SINGULAIR) 10 MG tablet Take 1 tablet (10 mg total) by mouth daily. 05/19/23     Multiple Vitamins-Minerals (MENS 50+ MULTI VITAMIN/MIN) TABS Take 1 tablet by mouth daily.    [provider]  olmesartan (BENICAR) 20 MG tablet Take 1 tablet (20 mg total) by mouth daily. 11/15/22     olmesartan (BENICAR) 40 MG tablet Take 1 tablet (40 mg total) by mouth daily. 12/02/22     ondansetron (ZOFRAN-ODT) 4 MG disintegrating tablet Take 1 tablet (4 mg total) by mouth every 8 (eight) hours as needed for nausea or vomiting. 04/26/23   Radford Pax, NP  oxyCODONE (OXY IR/ROXICODONE) 5 MG immediate release tablet Take 1 tablet (5 mg total) by mouth every 4 (four) hours as needed for moderate pain. 12/31/20   Noel Christmas, MD  rosuvastatin (CRESTOR) 5 MG tablet Take 1 tablet (5 mg total) by mouth daily. 09/22/22   Jake Bathe, MD  sildenafil (VIAGRA) 100 MG tablet Take 1 tablet (100 mg total) by mouth as needed daily 05/07/21     sildenafil (VIAGRA) 100 MG tablet Take 1 tablet (100 mg total) by mouth daily as needed. 05/12/22     sildenafil (VIAGRA) 100 MG tablet Take 1 tablet (100 mg total) by mouth daily as needed. 05/19/23     tadalafil (CIALIS) 5 MG tablet Take 1-2 tablets (5-10 mg total) by mouth every other day 01/21/21     tamsulosin (FLOMAX) 0.4 MG CAPS capsule Take 1 capsule (0.4 mg total) by mouth at bedtime. 12/31/20   Noel Christmas, MD  testosterone (ANDROGEL) 50 MG/5GM (1%) GEL Apply 1 packet (5 g) onto the skin of shoulder, upper arms or abdomen daily in the morning 05/17/21       Family History Family History  Problem Relation Age of Onset   Osteoarthritis  Mother    Osteoarthritis Father    Hypertension Father    AAA (abdominal aortic aneurysm) Father    Diabetes Sister     Social History Social History   Tobacco Use   Smoking status: Never   Smokeless tobacco: Never  Vaping Use   Vaping status: Never Used  Substance Use Topics   Alcohol use: Yes    Alcohol/week: 4.0 standard drinks of alcohol    Types: 2 Glasses of wine, 2 Shots of liquor per  week    Comment: 2 a week   Drug use: No     Allergies   Patient has no known allergies.   Review of Systems Review of Systems  HENT:  Positive for congestion, sinus pressure and sinus pain.   Respiratory:  Positive for cough and shortness of breath.   Neurological:  Positive for headaches.     Physical Exam Triage Vital Signs ED Triage Vitals [05/25/23 1521]  Encounter Vitals Group     BP (!) 147/87     Systolic BP Percentile      Diastolic BP Percentile      Pulse Rate 65     Resp 16     Temp 99.4 F (37.4 C)     Temp Source Oral     SpO2 96 %     Weight      Height      Head Circumference      Peak Flow      Pain Score      Pain Loc      Pain Education      Exclude from Growth Chart    No data found.  Updated Vital Signs BP (!) 147/87 (BP Location: Right Arm)   Pulse 65   Temp 99.4 F (37.4 C) (Oral)   Resp 16   SpO2 96%   Visual Acuity Right Eye Distance:   Left Eye Distance:   Bilateral Distance:    Right Eye Near:   Left Eye Near:    Bilateral Near:     Physical Exam Vitals and nursing note reviewed.  Constitutional:      General: He is not in acute distress.    Appearance: Normal appearance. He is not ill-appearing or toxic-appearing.  HENT:     Head: Normocephalic and atraumatic.     Right Ear: Tympanic membrane and ear canal normal.     Left Ear: Tympanic membrane and ear canal normal.     Nose: Congestion present.     Right Turbinates: Swollen and pale.     Left Turbinates: Swollen and pale.     Right Sinus: Maxillary sinus  tenderness present.     Left Sinus: Maxillary sinus tenderness present.     Mouth/Throat:     Mouth: Mucous membranes are moist.     Pharynx: No oropharyngeal exudate or posterior oropharyngeal erythema.  Eyes:     Pupils: Pupils are equal, round, and reactive to light.  Cardiovascular:     Rate and Rhythm: Normal rate and regular rhythm.     Heart sounds: Normal heart sounds.  Pulmonary:     Effort: Pulmonary effort is normal.     Breath sounds: Normal breath sounds. No wheezing or rhonchi.  Musculoskeletal:     Cervical back: Normal range of motion and neck supple.  Lymphadenopathy:     Cervical: No cervical adenopathy.  Skin:    General: Skin is warm and dry.  Neurological:     General: No focal deficit present.     Mental Status: He is alert and oriented to person, place, and time.  Psychiatric:        Mood and Affect: Mood normal.        Behavior: Behavior normal.      UC Treatments / Results  Labs (all labs ordered are listed, but only abnormal results are displayed) Labs Reviewed - No data to display  Basic metabolic panel Order: [id number]  Status: Final result     Next appt:  None     Dx: Primary hypertension; Elevated serum ...   Test Result Released: Yes (seen)     Messages: Seen   2 Result Notes     1 Patient Communication     View Follow-Up Encounter          Component Ref Range & Units (hover) 8 mo ago (09/13/22) 9 mo ago (08/25/22) 9 mo ago (08/12/22) 2 yr ago (12/31/20) 2 yr ago (12/30/20) 2 yr ago (12/18/20) 8 yr ago (12/30/14)  Glucose 84 105 High  95 126 High  CM 168 High  CM 94 CM 135 High  R  BUN 20 24 30  High  21 R 23 R 22 R 16 R  Creatinine, Ser 1.43 High  1.46 High  1.51 High  1.27 High  R 1.22 R 1.09 R 0.84 R  eGFR 55 Low  53 Low  51 Low       BUN/Creatinine Ratio 14 16 20       Sodium 141 139 139 136 R 140 R 139 R 136 R  Potassium 3.8 3.5 3.6 3.9 R 3.4 Low  R 3.7 R 4.6 R  Chloride 101 97 98 102 R 106 R 107 R 101 R  CO2 26 25 26 26   R 25 R 24 R 25 R  Calcium 9.7 10.0 9.7 8.8 Low  R 9.1 R 9.0 R 9.0 R  Resulting Agency [facility]         Narrative Performed by: Verdell Carmine Performed at:  [address], [place], [place]  [id number] Lab Director: Jolene Schimke MD, Phone:  [phone]  Specimen Collected: 09/13/22 13:56 Last Resulted: 09/14/22 01:35       EKG   Radiology No results found.  Procedures Procedures (including critical care time)  Medications Ordered in UC Medications - No data to display  Initial Impression / Assessment and Plan / UC Course  I have reviewed the triage vital signs and the nursing notes.  Pertinent labs & imaging results that were available during my care of the patient were reviewed by me and considered in my medical decision making (see chart for details).     Reviewed exam and symptoms with patient.  No red flags.  Wet read of chest x-ray shows interstitial markings no obvious consolidation, will contact for any positive results based on radiology overread.  Discussed sinusitis and will start Augmentin.  He uses Flonase at night and will continue.  Promethazine DM as needed for cough, side effect profile reviewed.  Discussed rest fluids and PCP follow-up if symptoms do not improve.  ER precautions reviewed and patient verbalized understanding. Final Clinical Impressions(s) / UC Diagnoses   Final diagnoses:  Acute cough  Acute maxillary sinusitis, recurrence not specified     Discharge Instructions      Start Augmentin twice daily for 7 days.  Continue Flonase at night.  You may take Promethazine DM as needed for your cough.  Please note this medication will make you drowsy.  Do not drink alcohol or drive while on this medication.  Nasal rinses as tolerated.  Lots of rest and fluids.  Please follow-up with your PCP if your symptoms do not improve.  Please go to the ER for any worsening  symptoms.  Hope you feel better soon!     ED Prescriptions     Medication Sig Dispense Auth. Provider   amoxicillin-clavulanate (AUGMENTIN) 875-125  MG tablet Take 1 tablet by mouth every 12 (twelve) hours. 14 tablet Radford Pax, NP   promethazine-dextromethorphan (PROMETHAZINE-DM) 6.25-15 MG/5ML syrup Take 5 mLs by mouth at bedtime as needed for cough. 118 mL Radford Pax, NP      PDMP not reviewed this encounter.   Radford Pax, NP 05/25/23 [phone]

## 2023-05-25 NOTE — Discharge Instructions (Signed)
 Start Augmentin twice daily for 7 days.  Continue Flonase at night.  You may take Promethazine DM as needed for your cough.  Please note this medication will make you drowsy.  Do not drink alcohol or drive while on this medication.  Nasal rinses as tolerated.  Lots of rest and fluids.  Please follow-up with your PCP if your symptoms do not improve.  Please go to the ER for any worsening symptoms.  Hope you feel better soon!

## 2023-06-05 DIAGNOSIS — G4733 Obstructive sleep apnea (adult) (pediatric): Secondary | ICD-10-CM | POA: Diagnosis not present

## 2023-06-05 DIAGNOSIS — Z6841 Body Mass Index (BMI) 40.0 and over, adult: Secondary | ICD-10-CM | POA: Diagnosis not present

## 2023-06-12 ENCOUNTER — Emergency Department (HOSPITAL_COMMUNITY)

## 2023-06-12 ENCOUNTER — Other Ambulatory Visit (HOSPITAL_COMMUNITY): Payer: Self-pay

## 2023-06-12 ENCOUNTER — Other Ambulatory Visit: Payer: Self-pay

## 2023-06-12 ENCOUNTER — Inpatient Hospital Stay (HOSPITAL_COMMUNITY)
Admission: EM | Admit: 2023-06-12 | Discharge: 2023-06-15 | DRG: 660 | Disposition: A | Discharge status: Home / Self Care | Attending: Internal Medicine | Admitting: Internal Medicine

## 2023-06-12 ENCOUNTER — Encounter (HOSPITAL_COMMUNITY): Payer: Self-pay

## 2023-06-12 DIAGNOSIS — Z6841 Body Mass Index (BMI) 40.0 and over, adult: Secondary | ICD-10-CM

## 2023-06-12 DIAGNOSIS — G4733 Obstructive sleep apnea (adult) (pediatric): Secondary | ICD-10-CM | POA: Diagnosis present

## 2023-06-12 DIAGNOSIS — N201 Calculus of ureter: Secondary | ICD-10-CM | POA: Diagnosis not present

## 2023-06-12 DIAGNOSIS — N179 Acute kidney failure, unspecified: Secondary | ICD-10-CM | POA: Diagnosis not present

## 2023-06-12 DIAGNOSIS — M1990 Unspecified osteoarthritis, unspecified site: Secondary | ICD-10-CM | POA: Diagnosis present

## 2023-06-12 DIAGNOSIS — R3912 Poor urinary stream: Secondary | ICD-10-CM | POA: Diagnosis not present

## 2023-06-12 DIAGNOSIS — E7800 Pure hypercholesterolemia, unspecified: Secondary | ICD-10-CM | POA: Diagnosis not present

## 2023-06-12 DIAGNOSIS — N1330 Unspecified hydronephrosis: Secondary | ICD-10-CM

## 2023-06-12 DIAGNOSIS — E66813 Obesity, class 3: Secondary | ICD-10-CM | POA: Diagnosis not present

## 2023-06-12 DIAGNOSIS — Z7982 Long term (current) use of aspirin: Secondary | ICD-10-CM

## 2023-06-12 DIAGNOSIS — N1831 Chronic kidney disease, stage 3a: Secondary | ICD-10-CM | POA: Diagnosis present

## 2023-06-12 DIAGNOSIS — Z8249 Family history of ischemic heart disease and other diseases of the circulatory system: Secondary | ICD-10-CM

## 2023-06-12 DIAGNOSIS — K5730 Diverticulosis of large intestine without perforation or abscess without bleeding: Secondary | ICD-10-CM | POA: Diagnosis not present

## 2023-06-12 DIAGNOSIS — N189 Chronic kidney disease, unspecified: Principal | ICD-10-CM

## 2023-06-12 DIAGNOSIS — Z79899 Other long term (current) drug therapy: Secondary | ICD-10-CM

## 2023-06-12 DIAGNOSIS — Z87442 Personal history of urinary calculi: Secondary | ICD-10-CM | POA: Diagnosis not present

## 2023-06-12 DIAGNOSIS — R188 Other ascites: Secondary | ICD-10-CM | POA: Diagnosis not present

## 2023-06-12 DIAGNOSIS — R109 Unspecified abdominal pain: Secondary | ICD-10-CM | POA: Diagnosis not present

## 2023-06-12 DIAGNOSIS — D649 Anemia, unspecified: Secondary | ICD-10-CM | POA: Diagnosis not present

## 2023-06-12 DIAGNOSIS — Z8261 Family history of arthritis: Secondary | ICD-10-CM | POA: Diagnosis not present

## 2023-06-12 DIAGNOSIS — Z96652 Presence of left artificial knee joint: Secondary | ICD-10-CM | POA: Diagnosis present

## 2023-06-12 DIAGNOSIS — I129 Hypertensive chronic kidney disease with stage 1 through stage 4 chronic kidney disease, or unspecified chronic kidney disease: Secondary | ICD-10-CM | POA: Diagnosis present

## 2023-06-12 DIAGNOSIS — I2510 Atherosclerotic heart disease of native coronary artery without angina pectoris: Secondary | ICD-10-CM | POA: Diagnosis not present

## 2023-06-12 DIAGNOSIS — N2889 Other specified disorders of kidney and ureter: Secondary | ICD-10-CM | POA: Diagnosis not present

## 2023-06-12 DIAGNOSIS — R001 Bradycardia, unspecified: Secondary | ICD-10-CM

## 2023-06-12 DIAGNOSIS — N200 Calculus of kidney: Secondary | ICD-10-CM

## 2023-06-12 DIAGNOSIS — Z466 Encounter for fitting and adjustment of urinary device: Secondary | ICD-10-CM | POA: Diagnosis not present

## 2023-06-12 DIAGNOSIS — I10 Essential (primary) hypertension: Secondary | ICD-10-CM | POA: Diagnosis not present

## 2023-06-12 DIAGNOSIS — Z833 Family history of diabetes mellitus: Secondary | ICD-10-CM

## 2023-06-12 DIAGNOSIS — N400 Enlarged prostate without lower urinary tract symptoms: Secondary | ICD-10-CM | POA: Diagnosis not present

## 2023-06-12 DIAGNOSIS — N132 Hydronephrosis with renal and ureteral calculous obstruction: Secondary | ICD-10-CM | POA: Diagnosis present

## 2023-06-12 DIAGNOSIS — K5792 Diverticulitis of intestine, part unspecified, without perforation or abscess without bleeding: Secondary | ICD-10-CM | POA: Diagnosis not present

## 2023-06-12 LAB — CBC WITH DIFFERENTIAL/PLATELET
Abs Immature Granulocytes: 0.06 10*3/uL (ref 0.00–0.07)
Basophils Absolute: 0 10*3/uL (ref 0.0–0.1)
Basophils Relative: 0 %
Eosinophils Absolute: 0.3 10*3/uL (ref 0.0–0.5)
Eosinophils Relative: 3 %
HCT: 41.9 % (ref 39.0–52.0)
Hemoglobin: 14.3 g/dL (ref 13.0–17.0)
Immature Granulocytes: 1 %
Lymphocytes Relative: 19 %
Lymphs Abs: 1.8 10*3/uL (ref 0.7–4.0)
MCH: 31.5 pg (ref 26.0–34.0)
MCHC: 34.1 g/dL (ref 30.0–36.0)
MCV: 92.3 fL (ref 80.0–100.0)
Monocytes Absolute: 1 10*3/uL (ref 0.1–1.0)
Monocytes Relative: 11 %
Neutro Abs: 5.9 10*3/uL (ref 1.7–7.7)
Neutrophils Relative %: 66 %
Platelets: 239 10*3/uL (ref 150–400)
RBC: 4.54 MIL/uL (ref 4.22–5.81)
RDW: 12.9 % (ref 11.5–15.5)
WBC: 9.1 10*3/uL (ref 4.0–10.5)
nRBC: 0 % (ref 0.0–0.2)

## 2023-06-12 LAB — URINALYSIS, ROUTINE W REFLEX MICROSCOPIC
Bilirubin Urine: NEGATIVE
Glucose, UA: NEGATIVE mg/dL
Hgb urine dipstick: NEGATIVE
Ketones, ur: NEGATIVE mg/dL
Leukocytes,Ua: NEGATIVE
Nitrite: NEGATIVE
Protein, ur: NEGATIVE mg/dL
Specific Gravity, Urine: 1.011 (ref 1.005–1.030)
pH: 6 (ref 5.0–8.0)

## 2023-06-12 LAB — COMPREHENSIVE METABOLIC PANEL WITH GFR
ALT: 20 U/L (ref 0–44)
AST: 19 U/L (ref 15–41)
Albumin: 3.8 g/dL (ref 3.5–5.0)
Alkaline Phosphatase: 57 U/L (ref 38–126)
Anion gap: 14 (ref 5–15)
BUN: 64 mg/dL — ABNORMAL HIGH (ref 8–23)
CO2: 23 mmol/L (ref 22–32)
Calcium: 9.8 mg/dL (ref 8.9–10.3)
Chloride: 102 mmol/L (ref 98–111)
Creatinine, Ser: 9 mg/dL — ABNORMAL HIGH (ref 0.61–1.24)
GFR, Estimated: 6 mL/min — ABNORMAL LOW (ref >60)
Glucose, Bld: 93 mg/dL (ref 70–99)
Potassium: 4.1 mmol/L (ref 3.5–5.1)
Sodium: 139 mmol/L (ref 135–145)
Total Bilirubin: 0.6 mg/dL (ref 0.0–1.2)
Total Protein: 7.2 g/dL (ref 6.5–8.1)

## 2023-06-12 MED ORDER — TAMSULOSIN HCL 0.4 MG PO CAPS
0.4000 mg | ORAL_CAPSULE | Freq: Every day | ORAL | 0 refills | Status: DC
  Filled 2023-06-12: qty 30, 30d supply, fill #0

## 2023-06-12 MED ORDER — OXYCODONE-ACETAMINOPHEN 5-325 MG PO TABS
1.0000 | ORAL_TABLET | Freq: Once | ORAL | Status: AC
  Administered 2023-06-12: 1 via ORAL
  Filled 2023-06-12: qty 1

## 2023-06-12 MED ORDER — FENTANYL CITRATE PF 50 MCG/ML IJ SOSY
12.5000 ug | PREFILLED_SYRINGE | INTRAMUSCULAR | Status: DC | PRN
  Administered 2023-06-13: 12.5 ug via INTRAVENOUS
  Filled 2023-06-12: qty 1

## 2023-06-12 MED ORDER — TAMSULOSIN HCL 0.4 MG PO CAPS
0.4000 mg | ORAL_CAPSULE | Freq: Every day | ORAL | Status: DC
  Administered 2023-06-13 – 2023-06-15 (×3): 0.4 mg via ORAL
  Filled 2023-06-12 (×2): qty 1

## 2023-06-12 MED ORDER — SODIUM CHLORIDE 0.9 % IV SOLN: INTRAVENOUS | Status: DC

## 2023-06-12 MED ORDER — ACETAMINOPHEN 325 MG PO TABS: 650.0000 mg | ORAL_TABLET | Freq: Four times a day (QID) | ORAL | Status: DC | PRN

## 2023-06-12 MED ORDER — LORATADINE 10 MG PO TABS
10.0000 mg | ORAL_TABLET | Freq: Every day | ORAL | Status: DC
  Administered 2023-06-13 – 2023-06-15 (×3): 10 mg via ORAL
  Filled 2023-06-12 (×3): qty 1

## 2023-06-12 MED ORDER — FLUTICASONE PROPIONATE 50 MCG/ACT NA SUSP
2.0000 | Freq: Every day | NASAL | Status: DC
  Administered 2023-06-14: 2 via NASAL
  Filled 2023-06-12 (×2): qty 16

## 2023-06-12 MED ORDER — MONTELUKAST SODIUM 10 MG PO TABS
10.0000 mg | ORAL_TABLET | Freq: Every day | ORAL | Status: DC
  Administered 2023-06-13 – 2023-06-15 (×3): 10 mg via ORAL
  Filled 2023-06-12 (×3): qty 1

## 2023-06-12 MED ORDER — ROSUVASTATIN CALCIUM 5 MG PO TABS
5.0000 mg | ORAL_TABLET | Freq: Every day | ORAL | Status: DC
  Administered 2023-06-13 – 2023-06-15 (×3): 5 mg via ORAL
  Filled 2023-06-12 (×3): qty 1

## 2023-06-12 MED ORDER — ONDANSETRON 4 MG PO TBDP: 4.0000 mg | ORAL_TABLET | Freq: Three times a day (TID) | ORAL | Status: DC | PRN

## 2023-06-12 MED ORDER — HYDRALAZINE HCL 20 MG/ML IJ SOLN: 5.0000 mg | INTRAMUSCULAR | Status: DC | PRN

## 2023-06-12 MED ORDER — DEXTROMETHORPHAN POLISTIREX ER 30 MG/5ML PO SUER: 15.0000 mg | Freq: Every day | ORAL | Status: DC | PRN

## 2023-06-12 MED ORDER — SODIUM CHLORIDE 0.9 % IV BOLUS: 1000.0000 mL | Freq: Once | INTRAVENOUS | Status: DC

## 2023-06-12 MED ORDER — NALOXONE HCL 0.4 MG/ML IJ SOLN: 0.4000 mg | INTRAMUSCULAR | Status: DC | PRN

## 2023-06-12 MED ORDER — SODIUM CHLORIDE 0.9 % IV BOLUS
500.0000 mL | Freq: Once | INTRAVENOUS | Status: AC
  Administered 2023-06-12: 500 mL via INTRAVENOUS

## 2023-06-12 MED ORDER — SODIUM CHLORIDE 0.9 % IV SOLN
2.0000 g | INTRAVENOUS | Status: DC
  Administered 2023-06-12 – 2023-06-14 (×3): 2 g via INTRAVENOUS
  Filled 2023-06-12 (×3): qty 20

## 2023-06-12 MED ORDER — ACETAMINOPHEN 650 MG RE SUPP: 650.0000 mg | Freq: Four times a day (QID) | RECTAL | Status: DC | PRN

## 2023-06-12 MED ORDER — HYDROCODONE-ACETAMINOPHEN 5-325 MG PO TABS
1.0000 | ORAL_TABLET | Freq: Four times a day (QID) | ORAL | 0 refills | Status: AC
Start: 2023-06-12 — End: ?
  Filled 2023-06-12: qty 20, 5d supply, fill #0

## 2023-06-12 MED ORDER — OXYCODONE HCL 5 MG PO TABS
5.0000 mg | ORAL_TABLET | Freq: Four times a day (QID) | ORAL | Status: DC | PRN
  Administered 2023-06-13 – 2023-06-15 (×5): 5 mg via ORAL
  Filled 2023-06-12 (×5): qty 1

## 2023-06-12 NOTE — ED Provider Notes (Signed)
 [place] EMERGENCY DEPARTMENT AT [facility] Provider Note   CSN: [id number] Arrival date & time: 06/12/23  1757     History  Chief Complaint  Patient presents with   Abnormal Labs    Adam Avila is a 63 y.o. male.  63 year old male presenting emergency department for abnormal lab.  He has had chronic kidney stones on his left, for the past 3 to 4 days he has had pain on his right side consistent with prior kidney stones.  No nausea no vomiting.  Some slight suprapubic discomfort.  No hematuria.  Reports feeling bloated and having decreased urine output.  Went to primary doctor who drew labs and stated his kidney function was elevated to go to the emergency department.        Home Medications Prior to Admission medications   Medication Sig Start Date End Date Taking? Authorizing Provider  acetaminophen  (TYLENOL ) 500 MG tablet Take 1,000 mg by mouth every 6 (six) hours as needed.   Yes [provider]  aspirin  EC 81 MG tablet Take 81 mg by mouth daily. Swallow whole.   Yes [provider]  cetirizine (ZYRTEC) 10 MG tablet Take 10 mg by mouth daily.   Yes [provider]  dextromethorphan (DELSYM) 30 MG/5ML liquid Take 15 mg by mouth as needed for cough.   Yes [provider]  fluticasone  (FLONASE ) 50 MCG/ACT nasal spray Place 2 sprays into both nostrils daily.   Yes [provider]  guaiFENesin (MUCINEX) 600 MG 12 hr tablet Take 1,200 mg by mouth 2 (two) times daily as needed for cough or to loosen phlegm.   Yes [provider]  HYDROcodone -acetaminophen  (NORCO/VICODIN) 5-325 MG tablet Take 1 tablet by mouth every 6 hours as needed for 5 days 06/12/23  Yes   montelukast  (SINGULAIR ) 10 MG tablet Take 1 tablet (10 mg total) by mouth daily. 05/19/23  Yes   Multiple Vitamins-Minerals (MENS 50+ MULTI VITAMIN/MIN) TABS Take 1 tablet by mouth daily.   Yes [provider]  olmesartan  (BENICAR ) 40 MG tablet Take 1  tablet (40 mg total) by mouth daily. 12/02/22  Yes   ondansetron  (ZOFRAN -ODT) 4 MG disintegrating tablet Take 1 tablet (4 mg total) by mouth every 8 (eight) hours as needed for nausea or vomiting. 04/26/23  Yes Mayer, Jodi R, NP  PRESCRIPTION MEDICATION 1 each at bedtime. CPAP with nasal pillow   Yes [provider]  promethazine -dextromethorphan (PROMETHAZINE -DM) 6.25-15 MG/5ML syrup Take 5 mLs by mouth at bedtime as needed for cough. 05/25/23  Yes Mayer, Jodi R, NP  rosuvastatin  (CRESTOR ) 5 MG tablet Take 1 tablet (5 mg total) by mouth daily. 09/22/22  Yes Hugh Madura, MD  sildenafil  (VIAGRA ) 100 MG tablet Take 1 tablet (100 mg total) by mouth daily as needed. 05/19/23  Yes   tamsulosin  (FLOMAX ) 0.4 MG CAPS capsule Take 1 capsule (0.4 mg total) by mouth daily. 06/12/23  Yes       Allergies    Patient has no known allergies.    Review of Systems   Review of Systems  Physical Exam Updated Vital Signs BP (!) 142/89   Pulse (!) 55   Temp 98.4 F (36.9 C) (Oral)   Resp 20   Ht 5\' 10"  (1.778 m)   Wt 136.1 kg   SpO2 97%   BMI 43.05 kg/m  Physical Exam Vitals and nursing note reviewed.  Constitutional:      General: He is not in acute distress.  Appearance: He is not toxic-appearing.  HENT:     Nose: Nose normal.     Mouth/Throat:     Mouth: Mucous membranes are moist.  Eyes:     Conjunctiva/sclera: Conjunctivae normal.  Cardiovascular:     Rate and Rhythm: Normal rate and regular rhythm.  Pulmonary:     Effort: Pulmonary effort is normal.     Breath sounds: Normal breath sounds.  Abdominal:     General: Abdomen is flat. There is no distension.     Tenderness: There is no abdominal tenderness. There is right CVA tenderness. There is no guarding or rebound.  Musculoskeletal:        General: Normal range of motion.  Skin:    General: Skin is warm and dry.     Capillary Refill: Capillary refill takes less than 2 seconds.  Neurological:     Mental Status: He is  oriented to person, place, and time.  Psychiatric:        Mood and Affect: Mood normal.        Behavior: Behavior normal.     ED Results / Procedures / Treatments   Labs (all labs ordered are listed, but only abnormal results are displayed) Labs Reviewed  COMPREHENSIVE METABOLIC PANEL WITH GFR - Abnormal; Notable for the following components:      Result Value   BUN 64 (*)    Creatinine, Ser 9.00 (*)    GFR, Estimated 6 (*)    All other components within normal limits  URINALYSIS, ROUTINE W REFLEX MICROSCOPIC - Abnormal; Notable for the following components:   Color, Urine AMBER (*)    All other components within normal limits  CBC WITH DIFFERENTIAL/PLATELET  HIV ANTIBODY (ROUTINE TESTING W REFLEX)  BASIC METABOLIC PANEL WITH GFR  CBC    EKG None  Radiology CT Renal Stone Study Result Date: 06/12/2023 CLINICAL DATA:  Abdominal/flank pain, stone suspected EXAM: CT ABDOMEN AND PELVIS WITHOUT CONTRAST TECHNIQUE: Multidetector CT imaging of the abdomen and pelvis was performed following the standard protocol without IV contrast. RADIATION DOSE REDUCTION: This exam was performed according to the departmental dose-optimization program which includes automated exposure control, adjustment of the mA and/or kV according to patient size and/or use of iterative reconstruction technique. COMPARISON:  November 17, 2022, December 31, 2020 FINDINGS: Of note, the lack of intravenous contrast limits evaluation of the solid organ parenchyma and vascularity. Lower chest: No focal airspace consolidation or pleural effusion. Posterior bibasilar dependent atelectasis. Hepatobiliary: No mass. Decompressed gallbladder without radiopaque stones or wall thickening. No intrahepatic or extrahepatic biliary ductal dilation. Pancreas: No mass or main ductal dilation. No peripancreatic inflammation or fluid collection. Spleen: Normal size. No mass. Adrenals/Urinary Tract: No adrenal masses. Atrophic left kidney. No  renal mass. Punctate nonobstructive calculi within the right kidney. Mild to moderate right-sided hydronephrosis due to an obstructive calculus at the right UPJ, measuring 3 x 3 x 4 mm. Partially distended urinary bladder without visualized abnormality. Stomach/Bowel:Small amount of fluid in the distal esophagus. The stomach contains ingested material without focal abnormality. No small bowel wall thickening or inflammation. No small bowel obstruction. Normal appendix. Descending and sigmoid colonic diverticulosis. No changes of acute diverticulitis. Vascular/Lymphatic: No aortic aneurysm. Scattered aortoiliac atherosclerosis. No intraabdominal or pelvic lymphadenopathy. Reproductive: No prostatomegaly. No free pelvic fluid. Other: No pneumoperitoneum, ascites, or mesenteric inflammation. Musculoskeletal: No acute fracture or destructive lesion. Multilevel degenerative disc disease of the spine. Moderate right hip osteoarthritis. Likely bone island in the left iliac bone. IMPRESSION: 1.  Mild to moderate right-sided hydronephrosis due to an obstructive calculus at the right UPJ, measuring 3 x 3 x 4 mm. 2. Small amount of fluid in the distal esophagus, possibly due to incomplete emptying or gastroesophageal reflux disease. 3. Descending and sigmoid colonic diverticulosis. No changes of acute diverticulitis. Electronically Signed   By: Rance Burrows M.D.   On: 06/12/2023 20:18    Procedures Procedures    Medications Ordered in ED Medications  cefTRIAXone (ROCEPHIN) 2 g in sodium chloride  0.9 % 100 mL IVPB (has no administration in time range)  rosuvastatin  (CRESTOR ) tablet 5 mg (has no administration in time range)  ondansetron  (ZOFRAN -ODT) disintegrating tablet 4 mg (has no administration in time range)  tamsulosin  (FLOMAX ) capsule 0.4 mg (has no administration in time range)  loratadine  (CLARITIN ) tablet 10 mg (has no administration in time range)  fluticasone  (FLONASE ) 50 MCG/ACT nasal spray 2 spray  (has no administration in time range)  montelukast  (SINGULAIR ) tablet 10 mg (has no administration in time range)  dextromethorphan (DELSYM) 30 MG/5ML liquid 15 mg (has no administration in time range)  acetaminophen  (TYLENOL ) tablet 650 mg (has no administration in time range)    Or  acetaminophen  (TYLENOL ) suppository 650 mg (has no administration in time range)  0.9 %  sodium chloride  infusion (has no administration in time range)  oxyCODONE  (Oxy IR/ROXICODONE ) immediate release tablet 5 mg (has no administration in time range)  naloxone (NARCAN) injection 0.4 mg (has no administration in time range)  fentaNYL  (SUBLIMAZE ) injection 12.5 mcg (has no administration in time range)  hydrALAZINE  (APRESOLINE ) injection 5 mg (has no administration in time range)  sodium chloride  0.9 % bolus 500 mL ( Intravenous Stopped 06/12/23 2154)  oxyCODONE -acetaminophen  (PERCOCET/ROXICET) 5-325 MG per tablet 1 tablet (1 tablet Oral Given 06/12/23 2123)    ED Course/ Medical Decision Making/ A&P Clinical Course as of 06/12/23 2228  Mon Jun 12, 2023  2028 CT Renal Stone Study IMPRESSION: 1. Mild to moderate right-sided hydronephrosis due to an obstructive calculus at the right UPJ, measuring 3 x 3 x 4 mm. 2. Small amount of fluid in the distal esophagus, possibly due to incomplete emptying or gastroesophageal reflux disease. 3. Descending and sigmoid colonic diverticulosis. No changes of acute diverticulitis.   Electronically Signed   By: Rance Burrows M.D.   [TY]  2111 Spoke with Dr. Heywood Louder with urology who will take for stenting 1:30 tomorrow at [facility].  Case discussed with hospitalist who agrees to admit patient.  Labs largely reassuring in terms of electrolytes.  However creatinine does continue to rise.  UA negative for acute urinary tract infection.  No leukocytosis to suggest infection.  Admitting for acute renal failure secondary to obstructing stone. [TY]    Clinical Course User Index [TY]  Rolinda Climes, DO                                 Medical Decision Making This is a 63 year old male presenting emergency department for elevated creatinine.  Has a complex history hypertension, recurrent kidney stones, obesity, chronic kidney disease class III presented for elevated creatinine.  He is hypertensive, afebrile nontachycardic.  Does not appear to be in overt distress.  Some minor tenderness to his right flank, but benign abdominal exam.  He showed me his MyChart with creatinine level of 8.  He is still making urine, but reports that it is little.  Offered pain medications, but declined  currently.  Awaiting labs and will get CT scan to evaluate for obstructive process.  Plan for admission for acute renal failure.  Amount and/or Complexity of Data Reviewed Independent Historian:     Details: Feeling emergency took Vicodin this morning External Data Reviewed:     Details: Per review has had stenting and lithotripsy in the past Labs:  Decision-making details documented in ED Course. Radiology:  Decision-making details documented in ED Course.  Risk Prescription drug management. Decision regarding hospitalization.          Final Clinical Impression(s) / ED Diagnoses Final diagnoses:  None    Rx / DC Orders ED Discharge Orders     None         Rolinda Climes, DO 06/12/23 2228

## 2023-06-12 NOTE — ED Provider Triage Note (Signed)
 Emergency Medicine Provider Triage Evaluation Note  Adam Avila , a 63 y.o. male  was evaluated in triage.  Pt complains of bloating and abdominal pain.  Pt saw primary care and was sent here for elevated renal functions.  Pt has right flank pain   Review of Systems  Positive: Decreased urine output Negative: fever  Physical Exam  BP (!) 176/103   Pulse 62   Temp 98.4 F (36.9 C) (Oral)   Resp 18   Ht 5\' 10"  (1.778 m)   Wt 136.1 kg   SpO2 97%   BMI 43.05 kg/m  Gen:   Awake, no distress   Resp:  Normal effort  MSK:   Moves extremities without difficulty  Other:  Abdomen distended.  Tender right flank   Medical Decision Making  Medically screening exam initiated at 6:59 PM.  Appropriate orders placed.  Ardeth Beckers Wilkowski was informed that the remainder of the evaluation will be completed by another provider, this initial triage assessment does not replace that evaluation, and the importance of remaining in the ED until their evaluation is complete.     Sandi Crosby, PA-C 06/12/23 1902

## 2023-06-12 NOTE — ED Notes (Signed)
 [facility] was called, and nursing report was given to 42M RN

## 2023-06-12 NOTE — ED Triage Notes (Signed)
 Elevated Bun, creatinine (8.95), and abnormal GFR (6). Pt reports hx of kidney stones and kidney issues. Reports urine is trickling out.

## 2023-06-12 NOTE — H&P (Signed)
 History and Physical    Adam Avila YQM:[id number] DOB: [birth year] DOA: 06/12/2023  PCP: Jimmey Mould, MD  Patient coming from: Home  Chief Complaint: Right-sided abdominal/flank pain  HPI: Adam Avila is a 63 y.o. male with medical history significant of CAD, hypertension, hyperlipidemia, OSA on CPAP, CKD stage IIIa, class III obesity, history of kidney stones, BPH presenting to the ED for evaluation of right-sided abdominal/flank pain and abdominal bloating x 3 days.  He has felt nauseous but has not vomited.  No fevers or chills.  He does report decreased urine output but has not noticed any blood in his urine.  He saw his PCP and had outpatient labs done which were concerning for acute kidney injury so he was sent to the ED to be evaluated.  He reports history of kidney stones on the left in the past.  He had an upper respiratory infection for which he was treated at the beginning of April.  Still having some mild cough related to allergies/postnasal drip but no shortness of breath.  No chest pain.  He takes aspirin  81 mg daily but no other blood thinners.  ED Course: Blood pressure elevated with systolic in the 170s but remainder of vital signs stable on arrival. Labs notable for BUN 64, creatinine 9.0 (baseline 1.4), GFR 6, potassium 4.1, bicarb 23, UA pending. CT showing an obstructive calculus at the right UPJ causing mild to moderate right-sided hydronephrosis. Patient was given Percocet and 500 mL normal saline.  ED physician discussed the case with Dr. Inga Manges with urology who requested admission at [facility] and planning on taking the patient to the OR tomorrow.  Review of Systems:  Review of Systems  All other systems reviewed and are negative.   Past Medical History:  Diagnosis Date   Allergic rhinitis    Arthritis    "back" (12/29/2014)   BPH (benign prostatic hyperplasia)    Class 3 severe obesity due to excess calories with serious comorbidity and body  mass index (BMI) of 40.0 to 44.9 in adult [facility])    DDD (degenerative disc disease), lumbar    Diverticulosis    ED (erectile dysfunction)    Edema    Elevated coronary artery calcium  score    Elevated fasting glucose    Exertional dyspnea    Fatigue    High cholesterol    History of kidney stones    HNP (herniated nucleus pulposus), lumbar    l4-5   HTN (hypertension)    Hypertension    Kidney stone    Leg fracture, right 1979   Leg fracture, right 1967   Liver cyst    Low testosterone     OA (osteoarthritis) of knee    Primary localized osteoarthritis of left knee 12/29/2014   Renal calculus    Sleep apnea    CPAP   Snoring    Vitamin B12 deficiency     Past Surgical History:  Procedure Laterality Date   ADENOIDECTOMY  ~ 1970   "not tonsils"   COLONOSCOPY     CYSTOSCOPY W/ STONE MANIPULATION  ~ 2002   LASIK Bilateral 2008   LITHOTRIPSY  X 1   NEPHROLITHOTOMY Left 12/30/2020   Procedure: LEFT NEPHROLITHOTOMY PERCUTANEOUS WTH  SURGEON ACCESS;  Surgeon: Roxane Copp, MD;  Location: [facility] ORS;  Service: Urology;  Laterality: Left;   TOTAL KNEE ARTHROPLASTY Left 12/29/2014   TOTAL KNEE ARTHROPLASTY Left 12/29/2014   Procedure: LEFT TOTAL KNEE ARTHROPLASTY;  Surgeon: Elly Habermann,  MD;  Location: [facility] OR;  Service: Orthopedics;  Laterality: Left;     reports that he has never smoked. He has never used smokeless tobacco. He reports current alcohol use of about 4.0 standard drinks of alcohol per week. He reports that he does not use drugs.  No Known Allergies  Family History  Problem Relation Age of Onset   Osteoarthritis Mother    Osteoarthritis Father    Hypertension Father    AAA (abdominal aortic aneurysm) Father    Diabetes Sister     Prior to Admission medications   Medication Sig Start Date End Date Taking? Authorizing Provider  aspirin  EC 81 MG tablet Take 81 mg by mouth daily. Swallow whole.    [provider]  celecoxib  (CELEBREX ) 200 MG capsule  Take 1 capsule (200 mg total) by mouth daily with food. 05/12/22     cetirizine (ZYRTEC) 10 MG tablet Take 10 mg by mouth daily.    [provider]  cyclobenzaprine  (FLEXERIL ) 10 MG tablet Take 1 tablet (10 mg total) by mouth at bedtime as needed for 20 days. 10/13/20     fluticasone  (FLONASE ) 50 MCG/ACT nasal spray Place 2 sprays into both nostrils daily.    [provider]  hydrochlorothiazide  (HYDRODIURIL ) 25 MG tablet Take 1 tablet (25 mg total) by mouth in the morning. 11/10/21     HYDROcodone -acetaminophen  (NORCO/VICODIN) 5-325 MG tablet Take 1-2 tablets by mouth every 8 (eight) hours as needed. 07/22/21     HYDROcodone -acetaminophen  (NORCO/VICODIN) 5-325 MG tablet Take 1 tablet by mouth every 6 hours as needed for 5 days 06/12/23     losartan  (COZAAR ) 100 MG tablet Take 1 tablet (100 mg total) by mouth daily. 05/12/22     metoprolol  tartrate (LOPRESSOR ) 100 MG tablet Take 1 tablet (100 mg total) by mouth once for 1 dose. Take 90-120 minutes prior to scan. 06/16/22 06/17/22  Sonny Dust, MD  montelukast  (SINGULAIR ) 10 MG tablet Take 1 tablet (10 mg total) by mouth daily. 05/19/23     Multiple Vitamins-Minerals (MENS 50+ MULTI VITAMIN/MIN) TABS Take 1 tablet by mouth daily.    [provider]  olmesartan  (BENICAR ) 20 MG tablet Take 1 tablet (20 mg total) by mouth daily. 11/15/22     olmesartan  (BENICAR ) 40 MG tablet Take 1 tablet (40 mg total) by mouth daily. 12/02/22     ondansetron  (ZOFRAN -ODT) 4 MG disintegrating tablet Take 1 tablet (4 mg total) by mouth every 8 (eight) hours as needed for nausea or vomiting. 04/26/23   Mayer, Jodi R, NP  oxyCODONE  (OXY IR/ROXICODONE ) 5 MG immediate release tablet Take 1 tablet (5 mg total) by mouth every 4 (four) hours as needed for moderate pain. 12/31/20   Pace, Maryellen D, MD  promethazine -dextromethorphan (PROMETHAZINE -DM) 6.25-15 MG/5ML syrup Take 5 mLs by mouth at bedtime as needed for cough. 05/25/23   Mayer, Jodi R, NP   rosuvastatin  (CRESTOR ) 5 MG tablet Take 1 tablet (5 mg total) by mouth daily. 09/22/22   Hugh Madura, MD  sildenafil  (VIAGRA ) 100 MG tablet Take 1 tablet (100 mg total) by mouth as needed daily 05/07/21     sildenafil  (VIAGRA ) 100 MG tablet Take 1 tablet (100 mg total) by mouth daily as needed. 05/12/22     sildenafil  (VIAGRA ) 100 MG tablet Take 1 tablet (100 mg total) by mouth daily as needed. 05/19/23     tadalafil  (CIALIS ) 5 MG tablet Take 1-2 tablets (5-10 mg total) by mouth every other day 01/21/21  tamsulosin  (FLOMAX ) 0.4 MG CAPS capsule Take 1 capsule (0.4 mg total) by mouth at bedtime. 12/31/20   Pace, Maryellen D, MD  tamsulosin  (FLOMAX ) 0.4 MG CAPS capsule Take 1 capsule (0.4 mg total) by mouth daily. 06/12/23     testosterone  (ANDROGEL ) 50 MG/5GM (1%) GEL Apply 1 packet (5 g) onto the skin of shoulder, upper arms or abdomen daily in the morning 05/17/21       Physical Exam: Vitals:   06/12/23 1826 06/12/23 1832 06/12/23 1950 06/12/23 2115  BP: (!) 176/103  (!) 153/84 (!) 142/89  Pulse: 62   (!) 55  Resp: 18   20  Temp: 98.4 F (36.9 C)     TempSrc: Oral     SpO2: 97%   97%  Weight:  136.1 kg    Height:  5\' 10"  (1.778 m)      Physical Exam Vitals reviewed.  Constitutional:      General: He is not in acute distress. HENT:     Head: Normocephalic and atraumatic.  Eyes:     Extraocular Movements: Extraocular movements intact.  Cardiovascular:     Rate and Rhythm: Regular rhythm. Bradycardia present.     Pulses: Normal pulses.  Pulmonary:     Effort: Pulmonary effort is normal. No respiratory distress.     Breath sounds: Normal breath sounds. No wheezing, rhonchi or rales.  Abdominal:     General: Bowel sounds are normal.     Palpations: Abdomen is soft.     Tenderness: There is no abdominal tenderness. There is no guarding.  Musculoskeletal:     Cervical back: Normal range of motion.     Right lower leg: No edema.     Left lower leg: No edema.  Skin:    General:  Skin is warm and dry.  Neurological:     General: No focal deficit present.     Mental Status: He is alert and oriented to person, place, and time.     Labs on Admission: I have personally reviewed following labs and imaging studies  CBC: Recent Labs  Lab 06/12/23 1940  WBC 9.1  NEUTROABS 5.9  HGB 14.3  HCT 41.9  MCV 92.3  PLT 239   Basic Metabolic Panel: Recent Labs  Lab 06/12/23 1940  NA 139  K 4.1  CL 102  CO2 23  GLUCOSE 93  BUN 64*  CREATININE 9.00*  CALCIUM  9.8   GFR: Estimated Creatinine Clearance: 11.4 mL/min (A) (by C-G formula based on SCr of 9 mg/dL (H)). Liver Function Tests: Recent Labs  Lab 06/12/23 1940  AST 19  ALT 20  ALKPHOS 57  BILITOT 0.6  PROT 7.2  ALBUMIN 3.8   No results for input(s): "LIPASE", "AMYLASE" in the last 168 hours. No results for input(s): "AMMONIA" in the last 168 hours. Coagulation Profile: No results for input(s): "INR", "PROTIME" in the last 168 hours. Cardiac Enzymes: No results for input(s): "CKTOTAL", "CKMB", "CKMBINDEX", "TROPONINI" in the last 168 hours. BNP (last 3 results) No results for input(s): "PROBNP" in the last 8760 hours. HbA1C: No results for input(s): "HGBA1C" in the last 72 hours. CBG: No results for input(s): "GLUCAP" in the last 168 hours. Lipid Profile: No results for input(s): "CHOL", "HDL", "LDLCALC", "TRIG", "CHOLHDL", "LDLDIRECT" in the last 72 hours. Thyroid  Function Tests: No results for input(s): "TSH", "T4TOTAL", "FREET4", "T3FREE", "THYROIDAB" in the last 72 hours. Anemia Panel: No results for input(s): "VITAMINB12", "FOLATE", "FERRITIN", "TIBC", "IRON", "RETICCTPCT" in the last 72 hours. Urine  analysis:    Component Value Date/Time   COLORURINE AMBER (A) 06/12/2023 1941   APPEARANCEUR CLEAR 06/12/2023 1941   LABSPEC 1.011 06/12/2023 1941   PHURINE 6.0 06/12/2023 1941   GLUCOSEU NEGATIVE 06/12/2023 1941   HGBUR NEGATIVE 06/12/2023 1941   BILIRUBINUR NEGATIVE 06/12/2023 1941    KETONESUR NEGATIVE 06/12/2023 1941   PROTEINUR NEGATIVE 06/12/2023 1941   NITRITE NEGATIVE 06/12/2023 1941   LEUKOCYTESUR NEGATIVE 06/12/2023 1941    Radiological Exams on Admission: CT Renal Stone Study Result Date: 06/12/2023 CLINICAL DATA:  Abdominal/flank pain, stone suspected EXAM: CT ABDOMEN AND PELVIS WITHOUT CONTRAST TECHNIQUE: Multidetector CT imaging of the abdomen and pelvis was performed following the standard protocol without IV contrast. RADIATION DOSE REDUCTION: This exam was performed according to the departmental dose-optimization program which includes automated exposure control, adjustment of the mA and/or kV according to patient size and/or use of iterative reconstruction technique. COMPARISON:  November 17, 2022, December 31, 2020 FINDINGS: Of note, the lack of intravenous contrast limits evaluation of the solid organ parenchyma and vascularity. Lower chest: No focal airspace consolidation or pleural effusion. Posterior bibasilar dependent atelectasis. Hepatobiliary: No mass. Decompressed gallbladder without radiopaque stones or wall thickening. No intrahepatic or extrahepatic biliary ductal dilation. Pancreas: No mass or main ductal dilation. No peripancreatic inflammation or fluid collection. Spleen: Normal size. No mass. Adrenals/Urinary Tract: No adrenal masses. Atrophic left kidney. No renal mass. Punctate nonobstructive calculi within the right kidney. Mild to moderate right-sided hydronephrosis due to an obstructive calculus at the right UPJ, measuring 3 x 3 x 4 mm. Partially distended urinary bladder without visualized abnormality. Stomach/Bowel:Small amount of fluid in the distal esophagus. The stomach contains ingested material without focal abnormality. No small bowel wall thickening or inflammation. No small bowel obstruction. Normal appendix. Descending and sigmoid colonic diverticulosis. No changes of acute diverticulitis. Vascular/Lymphatic: No aortic aneurysm. Scattered  aortoiliac atherosclerosis. No intraabdominal or pelvic lymphadenopathy. Reproductive: No prostatomegaly. No free pelvic fluid. Other: No pneumoperitoneum, ascites, or mesenteric inflammation. Musculoskeletal: No acute fracture or destructive lesion. Multilevel degenerative disc disease of the spine. Moderate right hip osteoarthritis. Likely bone island in the left iliac bone. IMPRESSION: 1. Mild to moderate right-sided hydronephrosis due to an obstructive calculus at the right UPJ, measuring 3 x 3 x 4 mm. 2. Small amount of fluid in the distal esophagus, possibly due to incomplete emptying or gastroesophageal reflux disease. 3. Descending and sigmoid colonic diverticulosis. No changes of acute diverticulitis. Electronically Signed   By: Rance Burrows M.D.   On: 06/12/2023 20:18    Assessment and Plan  Obstructing kidney stone causing mild to moderate right-sided hydronephrosis Patient is presenting with 3-day history of right-sided abdominal/flank pain and decreased urine output. CT showing an obstructive calculus at the right UPJ causing mild to moderate right-sided hydronephrosis.  No fever, tachycardia, or leukocytosis.  No signs of sepsis at this time.  Will start ceftriaxone empirically due to high risk of infection.  Follow-up UA and trend WBC count.  Urology requested admission at [facility] and planning on taking him to the OR tomorrow.  Will keep n.p.o. after midnight except sips with meds.  Continue gentle IV fluid hydration, pain management, and antiemetic PRN (EKG ordered to check QT interval).  Continue Flomax .  Hold home aspirin .  He is not on anticoagulation.  AKI on CKD stage IIIa  In the setting of obstructing kidney stone with hydronephrosis.  Creatinine 9.0 (baseline 1.4).  GFR now down to 6.  Potassium and bicarb normal.  Continue gentle IV fluid hydration and monitor renal function/electrolytes.  Avoid nephrotoxic agents/NSAIDs/contrast.  Hold home olmesartan .  Per pharmacy  med rec, patient is no longer on hydrochlorothiazide  and losartan .  Mild bradycardia Heart rate currently in the 50s but patient is asymptomatic.  Cardiac monitoring, EKG, and check TSH.  CAD Patient is not endorsing any anginal symptoms.  Hold aspirin  in anticipation of urologic procedure.  Continue statin.  Hypertension SBP currently in the 140s.  Hold olmesartan  due to AKI.  IV hydralazine  PRN SBP >160.   Hyperlipidemia Continue Crestor .  OSA Continue nightly CPAP.  BPH Continue Flomax .  DVT prophylaxis: SCDs Code Status: Full Code (discussed with the patient) Family Communication: Wife at bedside. Consults called: Urology Level of care: Telemetry bed Admission status: It is my clinical opinion that admission to INPATIENT is reasonable and necessary because of the expectation that this patient will require hospital care that crosses at least 2 midnights to treat this condition based on the medical complexity of the problems presented.  Given the aforementioned information, the predictability of an adverse outcome is felt to be significant.  Juliette Oh MD [facility]  If 7PM-7AM, please contact night-coverage [email or web address]  06/12/2023, 9:54 PM

## 2023-06-13 ENCOUNTER — Inpatient Hospital Stay (HOSPITAL_COMMUNITY)

## 2023-06-13 ENCOUNTER — Other Ambulatory Visit: Payer: Self-pay

## 2023-06-13 ENCOUNTER — Encounter (HOSPITAL_COMMUNITY)
Admission: EM | Disposition: A | Discharge status: Home / Self Care | Payer: Self-pay | Source: Home / Self Care | Attending: Family Medicine

## 2023-06-13 ENCOUNTER — Encounter (HOSPITAL_COMMUNITY): Payer: Self-pay | Admitting: Internal Medicine

## 2023-06-13 DIAGNOSIS — G4733 Obstructive sleep apnea (adult) (pediatric): Secondary | ICD-10-CM | POA: Diagnosis not present

## 2023-06-13 DIAGNOSIS — I2510 Atherosclerotic heart disease of native coronary artery without angina pectoris: Secondary | ICD-10-CM | POA: Diagnosis not present

## 2023-06-13 DIAGNOSIS — N2889 Other specified disorders of kidney and ureter: Secondary | ICD-10-CM

## 2023-06-13 DIAGNOSIS — N201 Calculus of ureter: Secondary | ICD-10-CM | POA: Diagnosis not present

## 2023-06-13 HISTORY — PX: CYSTOSCOPY W/ URETERAL STENT PLACEMENT: SHX1429

## 2023-06-13 LAB — CBC
HCT: 41.5 % (ref 39.0–52.0)
Hemoglobin: 14.4 g/dL (ref 13.0–17.0)
MCH: 31.7 pg (ref 26.0–34.0)
MCHC: 34.7 g/dL (ref 30.0–36.0)
MCV: 91.4 fL (ref 80.0–100.0)
Platelets: 221 10*3/uL (ref 150–400)
RBC: 4.54 MIL/uL (ref 4.22–5.81)
RDW: 12.9 % (ref 11.5–15.5)
WBC: 9.4 10*3/uL (ref 4.0–10.5)
nRBC: 0 % (ref 0.0–0.2)

## 2023-06-13 LAB — BASIC METABOLIC PANEL WITH GFR
Anion gap: 14 (ref 5–15)
BUN: 65 mg/dL — ABNORMAL HIGH (ref 8–23)
CO2: 20 mmol/L — ABNORMAL LOW (ref 22–32)
Calcium: 9.2 mg/dL (ref 8.9–10.3)
Chloride: 105 mmol/L (ref 98–111)
Creatinine, Ser: 10.06 mg/dL — ABNORMAL HIGH (ref 0.61–1.24)
GFR, Estimated: 5 mL/min — ABNORMAL LOW (ref >60)
Glucose, Bld: 105 mg/dL — ABNORMAL HIGH (ref 70–99)
Potassium: 5.3 mmol/L — ABNORMAL HIGH (ref 3.5–5.1)
Sodium: 139 mmol/L (ref 135–145)

## 2023-06-13 LAB — SURGICAL PCR SCREEN
MRSA, PCR: NEGATIVE
Staphylococcus aureus: POSITIVE — AB

## 2023-06-13 LAB — TSH: TSH: 4.513 u[IU]/mL — ABNORMAL HIGH (ref 0.350–4.500)

## 2023-06-13 LAB — HIV ANTIBODY (ROUTINE TESTING W REFLEX): HIV Screen 4th Generation wRfx: NONREACTIVE

## 2023-06-13 SURGERY — CYSTOSCOPY, WITH RETROGRADE PYELOGRAM AND URETERAL STENT INSERTION
Anesthesia: General | Laterality: Right

## 2023-06-13 MED ORDER — PROPOFOL 10 MG/ML IV BOLUS
INTRAVENOUS | Status: DC | PRN
  Administered 2023-06-13: 200 mg via INTRAVENOUS

## 2023-06-13 MED ORDER — PHENYLEPHRINE 80 MCG/ML (10ML) SYRINGE FOR IV PUSH (FOR BLOOD PRESSURE SUPPORT)
PREFILLED_SYRINGE | INTRAVENOUS | Status: AC
  Filled 2023-06-13: qty 10

## 2023-06-13 MED ORDER — FENTANYL CITRATE (PF) 250 MCG/5ML IJ SOLN
INTRAMUSCULAR | Status: DC | PRN
  Administered 2023-06-13: 50 ug via INTRAVENOUS

## 2023-06-13 MED ORDER — PROPOFOL 10 MG/ML IV BOLUS
INTRAVENOUS | Status: AC
  Filled 2023-06-13: qty 20

## 2023-06-13 MED ORDER — FENTANYL CITRATE (PF) 100 MCG/2ML IJ SOLN: 25.0000 ug | INTRAMUSCULAR | Status: DC | PRN

## 2023-06-13 MED ORDER — MIDAZOLAM HCL 2 MG/2ML IJ SOLN
INTRAMUSCULAR | Status: AC
  Filled 2023-06-13: qty 2

## 2023-06-13 MED ORDER — ORAL CARE MOUTH RINSE: 15.0000 mL | Freq: Once | OROMUCOSAL | Status: AC

## 2023-06-13 MED ORDER — OXYCODONE HCL 5 MG PO TABS
5.0000 mg | ORAL_TABLET | Freq: Once | ORAL | Status: AC | PRN
  Administered 2023-06-13: 5 mg via ORAL

## 2023-06-13 MED ORDER — ORAL CARE MOUTH RINSE: 15.0000 mL | OROMUCOSAL | Status: DC | PRN

## 2023-06-13 MED ORDER — LIDOCAINE HCL URETHRAL/MUCOSAL 2 % EX GEL
CUTANEOUS | Status: AC
  Filled 2023-06-13: qty 11

## 2023-06-13 MED ORDER — LIDOCAINE 2% (20 MG/ML) 5 ML SYRINGE
INTRAMUSCULAR | Status: DC | PRN
  Administered 2023-06-13: 100 mg via INTRAVENOUS

## 2023-06-13 MED ORDER — IOHEXOL 300 MG/ML  SOLN
INTRAMUSCULAR | Status: DC | PRN
  Administered 2023-06-13: 10 mL

## 2023-06-13 MED ORDER — ACETAMINOPHEN 10 MG/ML IV SOLN
INTRAVENOUS | Status: AC
  Filled 2023-06-13: qty 100

## 2023-06-13 MED ORDER — SODIUM CHLORIDE 0.9 % IV SOLN: INTRAVENOUS | Status: DC

## 2023-06-13 MED ORDER — ACETAMINOPHEN 10 MG/ML IV SOLN
1000.0000 mg | Freq: Once | INTRAVENOUS | Status: DC | PRN
  Administered 2023-06-13: 1000 mg via INTRAVENOUS

## 2023-06-13 MED ORDER — DEXAMETHASONE SODIUM PHOSPHATE 10 MG/ML IJ SOLN
INTRAMUSCULAR | Status: AC
  Filled 2023-06-13: qty 1

## 2023-06-13 MED ORDER — DEXAMETHASONE SODIUM PHOSPHATE 10 MG/ML IJ SOLN
INTRAMUSCULAR | Status: DC | PRN
  Administered 2023-06-13: 10 mg via INTRAVENOUS

## 2023-06-13 MED ORDER — SODIUM CHLORIDE 0.9 % IV SOLN: INTRAVENOUS | Status: AC

## 2023-06-13 MED ORDER — PHENYLEPHRINE HCL-NACL 20-0.9 MG/250ML-% IV SOLN
INTRAVENOUS | Status: DC | PRN
  Administered 2023-06-13: 25 ug/min via INTRAVENOUS

## 2023-06-13 MED ORDER — WATER FOR IRRIGATION, STERILE IR SOLN
Status: DC | PRN
  Administered 2023-06-13: 1000 mL

## 2023-06-13 MED ORDER — OXYCODONE HCL 5 MG/5ML PO SOLN: 5.0000 mg | Freq: Once | ORAL | Status: AC | PRN

## 2023-06-13 MED ORDER — MUPIROCIN 2 % EX OINT
1.0000 | TOPICAL_OINTMENT | Freq: Two times a day (BID) | CUTANEOUS | Status: DC
  Administered 2023-06-13 – 2023-06-15 (×4): 1 via NASAL
  Filled 2023-06-13: qty 22

## 2023-06-13 MED ORDER — PHENYLEPHRINE 80 MCG/ML (10ML) SYRINGE FOR IV PUSH (FOR BLOOD PRESSURE SUPPORT)
PREFILLED_SYRINGE | INTRAVENOUS | Status: DC | PRN
  Administered 2023-06-13: 160 ug via INTRAVENOUS
  Administered 2023-06-13: 80 ug via INTRAVENOUS

## 2023-06-13 MED ORDER — SODIUM CHLORIDE 0.9 % IR SOLN
Status: DC | PRN
  Administered 2023-06-13 (×2): 3000 mL via INTRAVESICAL

## 2023-06-13 MED ORDER — EPHEDRINE SULFATE-NACL 50-0.9 MG/10ML-% IV SOSY
PREFILLED_SYRINGE | INTRAVENOUS | Status: DC | PRN
  Administered 2023-06-13 (×3): 5 mg via INTRAVENOUS

## 2023-06-13 MED ORDER — FENTANYL CITRATE (PF) 250 MCG/5ML IJ SOLN
INTRAMUSCULAR | Status: AC
  Filled 2023-06-13: qty 5

## 2023-06-13 MED ORDER — MIDAZOLAM HCL 2 MG/2ML IJ SOLN
INTRAMUSCULAR | Status: DC | PRN
  Administered 2023-06-13: 2 mg via INTRAVENOUS

## 2023-06-13 MED ORDER — OXYCODONE HCL 5 MG PO TABS
ORAL_TABLET | ORAL | Status: AC
  Filled 2023-06-13: qty 1

## 2023-06-13 MED ORDER — CHLORHEXIDINE GLUCONATE CLOTH 2 % EX PADS
6.0000 | MEDICATED_PAD | Freq: Every day | CUTANEOUS | Status: DC
  Administered 2023-06-13 – 2023-06-14 (×2): 6 via TOPICAL

## 2023-06-13 MED ORDER — EPHEDRINE 5 MG/ML INJ
INTRAVENOUS | Status: AC
  Filled 2023-06-13: qty 5

## 2023-06-13 MED ORDER — DROPERIDOL 2.5 MG/ML IJ SOLN: 0.6250 mg | Freq: Once | INTRAMUSCULAR | Status: DC | PRN

## 2023-06-13 MED ORDER — ONDANSETRON HCL 4 MG/2ML IJ SOLN
INTRAMUSCULAR | Status: DC | PRN
  Administered 2023-06-13: 4 mg via INTRAVENOUS

## 2023-06-13 MED ORDER — CHLORHEXIDINE GLUCONATE 0.12 % MT SOLN
15.0000 mL | Freq: Once | OROMUCOSAL | Status: AC
  Administered 2023-06-13: 15 mL via OROMUCOSAL
  Filled 2023-06-13 (×2): qty 15

## 2023-06-13 MED ORDER — ONDANSETRON HCL 4 MG/2ML IJ SOLN
INTRAMUSCULAR | Status: AC
  Filled 2023-06-13: qty 2

## 2023-06-13 SURGICAL SUPPLY — 19 items
BAG URINE DRAIN 2000ML AR STRL (UROLOGICAL SUPPLIES) ×1 IMPLANT
BAG URO CATCHER STRL LF (MISCELLANEOUS) ×1 IMPLANT
CATH FOLEY 2WAY SLVR 5CC 16FR (CATHETERS) IMPLANT
CATH URETL OPEN END 6FR 70 (CATHETERS) ×1 IMPLANT
GLOVE BIOGEL M 8.0 STRL (GLOVE) ×1 IMPLANT
GOWN STRL REUS W/ TWL LRG LVL3 (GOWN DISPOSABLE) ×1 IMPLANT
GOWN STRL REUS W/ TWL XL LVL3 (GOWN DISPOSABLE) ×1 IMPLANT
GUIDEWIRE ANG ZIPWIRE 038X150 (WIRE) IMPLANT
GUIDEWIRE STR DUAL SENSOR (WIRE) ×1 IMPLANT
KIT TURNOVER KIT B (KITS) ×1 IMPLANT
MANIFOLD NEPTUNE II (INSTRUMENTS) IMPLANT
NS IRRIG 1000ML POUR BTL (IV SOLUTION) IMPLANT
PACK CYSTO (CUSTOM PROCEDURE TRAY) ×1 IMPLANT
STENT URET 6FRX24 CONTOUR (STENTS) IMPLANT
STENT URET 6FRX26 CONTOUR (STENTS) IMPLANT
SYPHON OMNI JUG (MISCELLANEOUS) ×1 IMPLANT
TOWEL GREEN STERILE FF (TOWEL DISPOSABLE) ×1 IMPLANT
TUBE CONNECTING 12X1/4 (SUCTIONS) IMPLANT
WATER STERILE IRR 3000ML UROMA (IV SOLUTION) ×1 IMPLANT

## 2023-06-13 NOTE — Anesthesia Procedure Notes (Signed)
 Procedure Name: LMA Insertion Date/Time: 06/13/2023 11:16 AM  Performed by: Artemisa Bile D, CRNAPre-anesthesia Checklist: Patient identified, Emergency Drugs available, Suction available and Patient being monitored Patient Re-evaluated:Patient Re-evaluated prior to induction Oxygen Delivery Method: Circle System Utilized Preoxygenation: Pre-oxygenation with 100% oxygen Induction Type: IV induction Ventilation: Mask ventilation without difficulty LMA: LMA inserted LMA Size: 5.0 Number of attempts: 1 Airway Equipment and Method: Bite block Placement Confirmation: positive ETCO2 Tube secured with: Tape Dental Injury: Teeth and Oropharynx as per pre-operative assessment

## 2023-06-13 NOTE — Plan of Care (Signed)
   Problem: Education: Goal: Knowledge of General Education information will improve Description Including pain rating scale, medication(s)/side effects and non-pharmacologic comfort measures Outcome: Progressing

## 2023-06-13 NOTE — Op Note (Signed)
 Procedure: 1.  Cystoscopy with right retrograde pyelogram and interpretation. 2.  Insertion of right double-J stent.  3 application of fluoroscopy.  Preop diagnosis: Right proximal ureteral stone with acute renal insufficiency.  Postop diagnosis: Same.  Surgeon: Dr. Homero Luster.  Anesthesia: General.  Specimen: None.  Drains: 6 French by 26 cm right contour double-J stent without tether.  EBL: None.  Complications: None.  Indications: The patient is a 63 year old male with a history of stones who presented to the emergency room with right flank pain and reduced urine output over the weekend.  In the emergency room he was found to have an obstructing right proximal ureteral stone approximately 5 mm with a creatinine of 9.  His left kidney was small but not obstructed.  It was felt that stenting was indicated.  Procedure: He had received Rocephin in the emergency room yesterday evening.  He was taken operating room where general anesthetic was induced.  He was placed in lithotomy position and fitted with PAS hose.  His perineum and genitalia were prepped with Betadine  solution he was draped in usual sterile fashion.  Cystoscopy was performed using a 21 [place] scope and 30 degree lens.  Examination revealed a normal urethra.  The external sphincter was intact.  The prostatic urethra short with minimal lateral lobe hyperplasia but the bladder neck was high and stiff.  I was able to get up over the bladder neck and inspection of the bladder demonstrated mild trabeculation without tumor, stones or inflammation.  Ureteral orifices were unremarkable.  The right ureteral orifice was cannulated with a 6 [place] open-ended catheter and Omnipaque  was instilled.  The right retrograde pyelogram demonstrated mild J hooking of the right distal ureter but an otherwise normal caliber ureter up to a filling defect in the proximal ureter consistent with a stone with hydronephrosis proximal to that.  The  sensor wire was then advanced through the open-ended catheter to the kidney and then the open-ended catheter was removed.  A 6 French by 26 cm contour double-J stent without tether was then advanced the kidney under fluoroscopic guidance.  The wire was removed, leaving a good coil in the kidney and a good coil in the bladder.  The cystoscope was then removed and a 16 French Silastic Foley catheter was inserted.  The balloon was filled with 10 mL of sterile fluid and the cath was placed straight drainage.  He was then taken down from lithotomy position, his anesthetic was reversed and he was moved to recovery in stable condition.  There were no complications.

## 2023-06-13 NOTE — Anesthesia Preprocedure Evaluation (Addendum)
 Anesthesia Evaluation  Patient identified by MRN, date of birth, ID band Patient awake    Reviewed: Allergy & Precautions, NPO status , Patient's Chart, lab work & pertinent test results  Airway Mallampati: I  TM Distance: >3 FB Neck ROM: Full    Dental  (+) Teeth Intact, Dental Advisory Given   Pulmonary sleep apnea and Continuous Positive Airway Pressure Ventilation    breath sounds clear to auscultation       Cardiovascular hypertension, Pt. on medications + CAD   Rhythm:Regular Rate:Normal     Neuro/Psych  PSYCHIATRIC DISORDERS       Neuromuscular disease    GI/Hepatic negative GI ROS, Neg liver ROS,,,  Endo/Other  negative endocrine ROS    Renal/GU ureteral stone     Musculoskeletal  (+) Arthritis ,    Abdominal  (+) + obese  Peds  Hematology negative hematology ROS (+)   Anesthesia Other Findings   Reproductive/Obstetrics                             Anesthesia Physical Anesthesia Plan  ASA: 3  Anesthesia Plan: General   Post-op Pain Management:    Induction: Intravenous  PONV Risk Score and Plan: 3 and Ondansetron , Dexamethasone  and Midazolam   Airway Management Planned: LMA  Additional Equipment: None  Intra-op Plan:   Post-operative Plan: Extubation in OR  Informed Consent: I have reviewed the patients History and Physical, chart, labs and discussed the procedure including the risks, benefits and alternatives for the proposed anesthesia with the patient or authorized representative who has indicated his/her understanding and acceptance.     Dental advisory given  Plan Discussed with: CRNA  Anesthesia Plan Comments:         Anesthesia Quick Evaluation

## 2023-06-13 NOTE — H&P (View-Only) (Signed)
 Urology Consult Note   Requesting Attending Physician:  Bobbetta Burnet, MD Service Providing Consult: Urology  Consulting Attending: Dr. Inga Manges   Reason for Consult:  right UPJ stone and AKI  HPI: Adam Avila is seen in consultation for reasons noted above at the request of [name], [name], MD. patient is a 63 year old male known to our practice presenting to [facility] emergency department with abdominal/flank pain on the right side x 3 days.  He denies fever, chills, vomiting but has been nauseous.  He has previously been seen by Dr. Valeta Gaudier for a greater than 3 cm left renal stone in a significantly atrophied left kidney.  Per patient's report the kidney is largely nonfunctional.  A split function study is not available to me at this time though that would be consistent with his severe AKI resulting from his right side obstructing stone.  I was able to discuss the case and plan with he and his wife, who joined us  by phone.  He was alert, oriented, no distress.  His pain was well-managed.  He is n.p.o. and is amenable to proceeding to the OR this afternoon.  ------------------  Assessment:  63 y.o. male with right UPJ stone   Recommendations: # Right UPJ stone # AKI  Baseline CKD with minimal left side kidney function.  Serum creatinine greater than 10 as of this morning. 3 x 4 mm obstructing right UPJ stone.  To the OR with Dr. Inga Manges for ureteral stent placement at 1120. Definitive stone management on an outpatient basis. Trend labs. Avoid nephrotoxic medications Urology will follow  Case and plan discussed with Dr. Inga Manges, pt, wife  Past Medical History: Past Medical History:  Diagnosis Date   Allergic rhinitis    Arthritis    "back" (12/29/2014)   BPH (benign prostatic hyperplasia)    Class 3 severe obesity due to excess calories with serious comorbidity and body mass index (BMI) of 40.0 to 44.9 in adult [facility])    DDD (degenerative disc disease), lumbar     Diverticulosis    ED (erectile dysfunction)    Edema    Elevated coronary artery calcium  score    Elevated fasting glucose    Exertional dyspnea    Fatigue    High cholesterol    History of kidney stones    HNP (herniated nucleus pulposus), lumbar    l4-5   HTN (hypertension)    Hypertension    Kidney stone    Leg fracture, right 1979   Leg fracture, right 1967   Liver cyst    Low testosterone     OA (osteoarthritis) of knee    Primary localized osteoarthritis of left knee 12/29/2014   Renal calculus    Sleep apnea    CPAP   Snoring    Vitamin B12 deficiency     Past Surgical History:  Past Surgical History:  Procedure Laterality Date   ADENOIDECTOMY  ~ 1970   "not tonsils"   COLONOSCOPY     CYSTOSCOPY W/ STONE MANIPULATION  ~ 2002   LASIK Bilateral 2008   LITHOTRIPSY  X 1   NEPHROLITHOTOMY Left 12/30/2020   Procedure: LEFT NEPHROLITHOTOMY PERCUTANEOUS WTH  SURGEON ACCESS;  Surgeon: Roxane Copp, MD;  Location: [facility] ORS;  Service: Urology;  Laterality: Left;   TOTAL KNEE ARTHROPLASTY Left 12/29/2014   TOTAL KNEE ARTHROPLASTY Left 12/29/2014   Procedure: LEFT TOTAL KNEE ARTHROPLASTY;  Surgeon: Elly Habermann, MD;  Location: [facility] OR;  Service: Orthopedics;  Laterality: Left;  Medication: Current Facility-Administered Medications  Medication Dose Route Frequency Provider Last Rate Last Admin   0.9 %  sodium chloride  infusion   Intravenous Continuous [name], Seyed A, MD 100 mL/hr at 06/13/23 0814 Rate Change at 06/13/23 6160   acetaminophen  (TYLENOL ) tablet 650 mg  650 mg Oral Q6H PRN Juliette Oh, MD       Or   acetaminophen  (TYLENOL ) suppository 650 mg  650 mg Rectal Q6H PRN Juliette Oh, MD       cefTRIAXone (ROCEPHIN) 2 g in sodium chloride  0.9 % 100 mL IVPB  2 g Intravenous Q24H Juliette Oh, MD 200 mL/hr at 06/12/23 2242 2 g at 06/12/23 2242   Chlorhexidine  Gluconate Cloth 2 % PADS 6 each  6 each Topical Daily Rathore, Vasundhra, MD        dextromethorphan (DELSYM) 30 MG/5ML liquid 15 mg  15 mg Oral Daily PRN Juliette Oh, MD       fentaNYL  (SUBLIMAZE ) injection 12.5 mcg  12.5 mcg Intravenous Q2H PRN Rathore, Vasundhra, MD   12.5 mcg at 06/13/23 7371   fluticasone  (FLONASE ) 50 MCG/ACT nasal spray 2 spray  2 spray Each Nare Daily Juliette Oh, MD       hydrALAZINE  (APRESOLINE ) injection 5 mg  5 mg Intravenous Q4H PRN Juliette Oh, MD       loratadine  (CLARITIN ) tablet 10 mg  10 mg Oral Daily Rathore, Vasundhra, MD       montelukast  (SINGULAIR ) tablet 10 mg  10 mg Oral Daily Rathore, Vasundhra, MD       mupirocin ointment (BACTROBAN) 2 % 1 Application  1 Application Nasal BID Rathore, Vasundhra, MD       naloxone (NARCAN) injection 0.4 mg  0.4 mg Intravenous PRN Rathore, Vasundhra, MD       ondansetron  (ZOFRAN -ODT) disintegrating tablet 4 mg  4 mg Oral Q8H PRN Rathore, Vasundhra, MD       Oral care mouth rinse  15 mL Mouth Rinse PRN Rathore, Vasundhra, MD       oxyCODONE  (Oxy IR/ROXICODONE ) immediate release tablet 5 mg  5 mg Oral Q6H PRN Rathore, Vasundhra, MD   5 mg at 06/13/23 0555   rosuvastatin  (CRESTOR ) tablet 5 mg  5 mg Oral Daily Rathore, Vasundhra, MD       tamsulosin  (FLOMAX ) capsule 0.4 mg  0.4 mg Oral Daily Juliette Oh, MD        Allergies: No Known Allergies  Social History: Social History   Tobacco Use   Smoking status: Never   Smokeless tobacco: Never  Vaping Use   Vaping status: Never Used  Substance Use Topics   Alcohol use: Yes    Alcohol/week: 4.0 standard drinks of alcohol    Types: 2 Glasses of wine, 2 Shots of liquor per week    Comment: 2 a week   Drug use: No    Family History Family History  Problem Relation Age of Onset   Osteoarthritis Mother    Osteoarthritis Father    Hypertension Father    AAA (abdominal aortic aneurysm) Father    Diabetes Sister     Review of Systems  Genitourinary:  Positive for flank pain. Negative for dysuria, frequency, hematuria and  urgency.     Objective   Vital signs in last 24 hours: BP (!) 156/92 (BP Location: Left Arm)   Pulse (!) 53   Temp 98.1 F (36.7 C)   Resp 18   Ht 5\' 10"  (1.778 m)   Wt 136.1 kg   SpO2  100%   BMI 43.05 kg/m   Physical Exam General: A&O, resting, appropriate HEENT: [place]/AT Pulmonary: Normal work of breathing Cardiovascular: no cyanosis Neuro: Appropriate, no focal neurological deficits  Most Recent Labs: Lab Results  Component Value Date   WBC 9.4 06/13/2023   HGB 14.4 06/13/2023   HCT 41.5 06/13/2023   PLT 221 06/13/2023    Lab Results  Component Value Date   NA 139 06/13/2023   K 5.3 (H) 06/13/2023   CL 105 06/13/2023   CO2 20 (L) 06/13/2023   BUN 65 (H) 06/13/2023   CREATININE 10.06 (H) 06/13/2023   CALCIUM  9.2 06/13/2023    Lab Results  Component Value Date   INR 1.08 12/19/2014   APTT 29 12/19/2014     Urine Culture: @[id number] (laburin,org,[id number])@   IMAGING: CT Renal Stone Study Result Date: 06/12/2023 CLINICAL DATA:  Abdominal/flank pain, stone suspected EXAM: CT ABDOMEN AND PELVIS WITHOUT CONTRAST TECHNIQUE: Multidetector CT imaging of the abdomen and pelvis was performed following the standard protocol without IV contrast. RADIATION DOSE REDUCTION: This exam was performed according to the departmental dose-optimization program which includes automated exposure control, adjustment of the mA and/or kV according to patient size and/or use of iterative reconstruction technique. COMPARISON:  November 17, 2022, December 31, 2020 FINDINGS: Of note, the lack of intravenous contrast limits evaluation of the solid organ parenchyma and vascularity. Lower chest: No focal airspace consolidation or pleural effusion. Posterior bibasilar dependent atelectasis. Hepatobiliary: No mass. Decompressed gallbladder without radiopaque stones or wall thickening. No intrahepatic or extrahepatic biliary ductal dilation. Pancreas: No mass or main ductal dilation. No  peripancreatic inflammation or fluid collection. Spleen: Normal size. No mass. Adrenals/Urinary Tract: No adrenal masses. Atrophic left kidney. No renal mass. Punctate nonobstructive calculi within the right kidney. Mild to moderate right-sided hydronephrosis due to an obstructive calculus at the right UPJ, measuring 3 x 3 x 4 mm. Partially distended urinary bladder without visualized abnormality. Stomach/Bowel:Small amount of fluid in the distal esophagus. The stomach contains ingested material without focal abnormality. No small bowel wall thickening or inflammation. No small bowel obstruction. Normal appendix. Descending and sigmoid colonic diverticulosis. No changes of acute diverticulitis. Vascular/Lymphatic: No aortic aneurysm. Scattered aortoiliac atherosclerosis. No intraabdominal or pelvic lymphadenopathy. Reproductive: No prostatomegaly. No free pelvic fluid. Other: No pneumoperitoneum, ascites, or mesenteric inflammation. Musculoskeletal: No acute fracture or destructive lesion. Multilevel degenerative disc disease of the spine. Moderate right hip osteoarthritis. Likely bone island in the left iliac bone. IMPRESSION: 1. Mild to moderate right-sided hydronephrosis due to an obstructive calculus at the right UPJ, measuring 3 x 3 x 4 mm. 2. Small amount of fluid in the distal esophagus, possibly due to incomplete emptying or gastroesophageal reflux disease. 3. Descending and sigmoid colonic diverticulosis. No changes of acute diverticulitis. Electronically Signed   By: Rance Burrows M.D.   On: 06/12/2023 20:18    ------  Alla Ar, NP Pager: [phone]   Please contact the urology consult pager with any further questions/concerns.

## 2023-06-13 NOTE — Progress Notes (Signed)
 New Admission Note:  Arrival Method: [facility] Mental Orientation: Alert and oriented x 4 Telemetry: Box 19 SB Assessment: Completed Skin: Warm and dry IV: NSL Pain: Denies Tubes: N/A Safety Measures: Safety Fall Prevention Plan initiated.  Admission: Completed 5 M  Orientation: Patient has been orientated to the room, unit and the staff. Welcome booklet given.  Family: None  Orders have been reviewed and implemented. Will continue to monitor the patient. Call light has been placed within reach and bed alarm has been activated.   Woodward Head BSN, RN  Phone Number: [phone]

## 2023-06-13 NOTE — Consult Note (Signed)
 Reason for Consult: Renal failure Referring Physician: Dr. Eilene Grater  Chief Complaint: flank pain  Assessment/Plan: AKI on CKD3a with a baseline creatinine of 1.4-1.55 with acute component secondary to obstructive uropathy which if his history is correct should be reversible.  - For now will keep on a renal diet until we see improvement in renal function. Already great UOP so likely represents postobstructive diuresis. - Will trend renal function and follow closely with you.  - With the great UOP I would not fluid restrict him unless UOP drops off significantly. He's breathing very comfortably as well. -Monitor Daily I/Os, Daily weight  -Maintain MAP>65 for optimal renal perfusion.  - Avoid nephrotoxic agents such as IV contrast, NSAIDs, and phosphate containing bowel preps (FLEETS)  Bradycardia CASHD - no active angina by history. HTN - holding olmesartan  for now. OSA BPH   HPI: Adam Avila is an 63 y.o. male BPH, HTN, OSA, nephrolithiasis with multiple therapies in the past including surgical removal of a 3cm left renal stone but also noted to have an atrophied left kidney. Patient is followed by Dr. Jearldine Mina for his renal needs and seen on an annual basis. He knows to drink 80oz a day and was noted to be in his normal health until walking 5 miles last Thursday. He noted decreased urine output with urgency and later that evening had right flank discomfort which never dissipated. He then presented to the ED with right flank pain with intermittent nausea but no vomiting. He was found to have a stone ion the right UPJ s/p stent deployment by urology on 4/29. Patient also feels better with markedly less fullness in the right flank.   ROS Pertinent items are noted in HPI.  Chemistry and CBC: Creatinine, Ser  Date/Time Value Ref Range Status  06/13/2023 04:45 AM 10.06 (H) 0.61 - 1.24 mg/dL Final  46/96/2952 84:13 PM 9.00 (H) 0.61 - 1.24 mg/dL Final  24/40/1027 25:36 PM 1.43 (H) 0.76 -  1.27 mg/dL Final  64/40/3474 25:95 PM 1.46 (H) 0.76 - 1.27 mg/dL Final  63/87/5643 32:95 AM 1.51 (H) 0.76 - 1.27 mg/dL Final  18/84/1660 63:01 AM 1.27 (H) 0.61 - 1.24 mg/dL Final  60/11/9321 55:73 PM 1.22 0.61 - 1.24 mg/dL Final  22/03/5425 06:23 PM 1.09 0.61 - 1.24 mg/dL Final  76/28/3151 76:16 AM 0.84 0.61 - 1.24 mg/dL Final  07/37/1062 69:48 AM 1.03 0.61 - 1.24 mg/dL Final   Recent Labs  Lab 06/12/23 1940 06/13/23 0445  NA 139 139  K 4.1 5.3*  CL 102 105  CO2 23 20*  GLUCOSE 93 105*  BUN 64* 65*  CREATININE 9.00* 10.06*  CALCIUM  9.8 9.2   Recent Labs  Lab 06/12/23 1940 06/13/23 0445  WBC 9.1 9.4  NEUTROABS 5.9  --   HGB 14.3 14.4  HCT 41.9 41.5  MCV 92.3 91.4  PLT 239 221   Liver Function Tests: Recent Labs  Lab 06/12/23 1940  AST 19  ALT 20  ALKPHOS 57  BILITOT 0.6  PROT 7.2  ALBUMIN 3.8   No results for input(s): "LIPASE", "AMYLASE" in the last 168 hours. No results for input(s): "AMMONIA" in the last 168 hours. Cardiac Enzymes: No results for input(s): "CKTOTAL", "CKMB", "CKMBINDEX", "TROPONINI" in the last 168 hours. Iron Studies: No results for input(s): "IRON", "TIBC", "TRANSFERRIN", "FERRITIN" in the last 72 hours. PT/INR: @LABRCNTIP (inr:5)  Xrays/Other Studies: ) Results for orders placed or performed during the hospital encounter of 06/12/23 (from the past 48 hours)  CBC with Differential  Status: None   Collection Time: 06/12/23  7:40 PM  Result Value Ref Range   WBC 9.1 4.0 - 10.5 K/uL   RBC 4.54 4.22 - 5.81 MIL/uL   Hemoglobin 14.3 13.0 - 17.0 g/dL   HCT 78.2 95.6 - 21.3 %   MCV 92.3 80.0 - 100.0 fL   MCH 31.5 26.0 - 34.0 pg   MCHC 34.1 30.0 - 36.0 g/dL   RDW 08.6 57.8 - 46.9 %   Platelets 239 150 - 400 K/uL   nRBC 0.0 0.0 - 0.2 %   Neutrophils Relative % 66 %   Neutro Abs 5.9 1.7 - 7.7 K/uL   Lymphocytes Relative 19 %   Lymphs Abs 1.8 0.7 - 4.0 K/uL   Monocytes Relative 11 %   Monocytes Absolute 1.0 0.1 - 1.0 K/uL    Eosinophils Relative 3 %   Eosinophils Absolute 0.3 0.0 - 0.5 K/uL   Basophils Relative 0 %   Basophils Absolute 0.0 0.0 - 0.1 K/uL   Immature Granulocytes 1 %   Abs Immature Granulocytes 0.06 0.00 - 0.07 K/uL    Comment: Performed at [facility], [address], [place], [place] [address]  Comprehensive metabolic panel     Status: Abnormal   Collection Time: 06/12/23  7:40 PM  Result Value Ref Range   Sodium 139 135 - 145 mmol/L   Potassium 4.1 3.5 - 5.1 mmol/L   Chloride 102 98 - 111 mmol/L   CO2 23 22 - 32 mmol/L   Glucose, Bld 93 70 - 99 mg/dL    Comment: Glucose reference range applies only to samples taken after fasting for at least 8 hours.   BUN 64 (H) 8 - 23 mg/dL   Creatinine, Ser 8.41 (H) 0.61 - 1.24 mg/dL   Calcium  9.8 8.9 - 10.3 mg/dL   Total Protein 7.2 6.5 - 8.1 g/dL   Albumin 3.8 3.5 - 5.0 g/dL   AST 19 15 - 41 U/L   ALT 20 0 - 44 U/L   Alkaline Phosphatase 57 38 - 126 U/L   Total Bilirubin 0.6 0.0 - 1.2 mg/dL   GFR, Estimated 6 (L) >60 mL/min    Comment: (NOTE) Calculated using the CKD-EPI Creatinine Equation (2021)    Anion gap 14 5 - 15    Comment: Performed at [facility], [address], [place], [place] [address]  Urinalysis, Routine w reflex microscopic -Urine, Clean Catch     Status: Abnormal   Collection Time: 06/12/23  7:41 PM  Result Value Ref Range   Color, Urine AMBER (A) YELLOW    Comment: BIOCHEMICALS MAY BE AFFECTED BY COLOR   APPearance CLEAR CLEAR   Specific Gravity, Urine 1.011 1.005 - 1.030   pH 6.0 5.0 - 8.0   Glucose, UA NEGATIVE NEGATIVE mg/dL   Hgb urine dipstick NEGATIVE NEGATIVE   Bilirubin Urine NEGATIVE NEGATIVE   Ketones, ur NEGATIVE NEGATIVE mg/dL   Protein, ur NEGATIVE NEGATIVE mg/dL   Nitrite NEGATIVE NEGATIVE   Leukocytes,Ua NEGATIVE NEGATIVE    Comment: Performed at [facility], [address], [place], [place] [address]  Surgical pcr screen     Status: Abnormal    Collection Time: 06/13/23 12:32 AM   Specimen: Nasal Mucosa; Nasal Swab  Result Value Ref Range   MRSA, PCR NEGATIVE NEGATIVE   Staphylococcus aureus POSITIVE (A) NEGATIVE    Comment: (NOTE) The Xpert SA Assay (FDA approved for NASAL specimens in patients 22 years  of age and older), is one component of a comprehensive surveillance program. It is not intended to diagnose infection nor to guide or monitor treatment. Performed at [facility], [address] [place], [place] [address]   Basic metabolic panel     Status: Abnormal   Collection Time: 06/13/23  4:45 AM  Result Value Ref Range   Sodium 139 135 - 145 mmol/L   Potassium 5.3 (H) 3.5 - 5.1 mmol/L   Chloride 105 98 - 111 mmol/L   CO2 20 (L) 22 - 32 mmol/L   Glucose, Bld 105 (H) 70 - 99 mg/dL    Comment: Glucose reference range applies only to samples taken after fasting for at least 8 hours.   BUN 65 (H) 8 - 23 mg/dL   Creatinine, Ser 09.81 (H) 0.61 - 1.24 mg/dL   Calcium  9.2 8.9 - 10.3 mg/dL   GFR, Estimated 5 (L) >60 mL/min    Comment: (NOTE) Calculated using the CKD-EPI Creatinine Equation (2021)    Anion gap 14 5 - 15    Comment: Performed at [facility], [address] [place], [place] [address]  CBC     Status: None   Collection Time: 06/13/23  4:45 AM  Result Value Ref Range   WBC 9.4 4.0 - 10.5 K/uL   RBC 4.54 4.22 - 5.81 MIL/uL   Hemoglobin 14.4 13.0 - 17.0 g/dL   HCT 82.9 56.2 - 13.0 %   MCV 91.4 80.0 - 100.0 fL   MCH 31.7 26.0 - 34.0 pg   MCHC 34.7 30.0 - 36.0 g/dL   RDW 86.5 78.4 - 69.6 %   Platelets 221 150 - 400 K/uL   nRBC 0.0 0.0 - 0.2 %    Comment: Performed at [facility], [address], [place], [place] [address]  TSH     Status: Abnormal   Collection Time: 06/13/23  4:45 AM  Result Value Ref Range   TSH 4.513 (H) 0.350 - 4.500 uIU/mL    Comment: Performed by a 3rd Generation assay with a functional sensitivity of <=0.01 uIU/mL. Performed at [facility],  [address] [place], [place] [address]   HIV Antibody (routine testing w rflx)     Status: None   Collection Time: 06/13/23  4:45 AM  Result Value Ref Range   HIV Screen 4th Generation wRfx Non Reactive Non Reactive    Comment: Performed at [facility], [address], [place], [place] [address]   CT Renal Stone Study Result Date: 06/12/2023 CLINICAL DATA:  Abdominal/flank pain, stone suspected EXAM: CT ABDOMEN AND PELVIS WITHOUT CONTRAST TECHNIQUE: Multidetector CT imaging of the abdomen and pelvis was performed following the standard protocol without IV contrast. RADIATION DOSE REDUCTION: This exam was performed according to the departmental dose-optimization program which includes automated exposure control, adjustment of the mA and/or kV according to patient size and/or use of iterative reconstruction technique. COMPARISON:  November 17, 2022, December 31, 2020 FINDINGS: Of note, the lack of intravenous contrast limits evaluation of the solid organ parenchyma and vascularity. Lower chest: No focal airspace consolidation or pleural effusion. Posterior bibasilar dependent atelectasis. Hepatobiliary: No mass. Decompressed gallbladder without radiopaque stones or wall thickening. No intrahepatic or extrahepatic biliary ductal dilation. Pancreas: No mass or main ductal dilation. No peripancreatic inflammation or fluid collection. Spleen: Normal size. No mass. Adrenals/Urinary Tract: No adrenal masses. Atrophic left kidney. No renal mass. Punctate nonobstructive calculi within the right kidney. Mild  to moderate right-sided hydronephrosis due to an obstructive calculus at the right UPJ, measuring 3 x 3 x 4 mm. Partially distended urinary bladder without visualized abnormality. Stomach/Bowel:Small amount of fluid in the distal esophagus. The stomach contains ingested material without focal abnormality. No small bowel wall thickening or inflammation. No small bowel obstruction. Normal appendix. Descending  and sigmoid colonic diverticulosis. No changes of acute diverticulitis. Vascular/Lymphatic: No aortic aneurysm. Scattered aortoiliac atherosclerosis. No intraabdominal or pelvic lymphadenopathy. Reproductive: No prostatomegaly. No free pelvic fluid. Other: No pneumoperitoneum, ascites, or mesenteric inflammation. Musculoskeletal: No acute fracture or destructive lesion. Multilevel degenerative disc disease of the spine. Moderate right hip osteoarthritis. Likely bone island in the left iliac bone. IMPRESSION: 1. Mild to moderate right-sided hydronephrosis due to an obstructive calculus at the right UPJ, measuring 3 x 3 x 4 mm. 2. Small amount of fluid in the distal esophagus, possibly due to incomplete emptying or gastroesophageal reflux disease. 3. Descending and sigmoid colonic diverticulosis. No changes of acute diverticulitis. Electronically Signed   By: Rance Burrows M.D.   On: 06/12/2023 20:18    PMH:   Past Medical History:  Diagnosis Date   Allergic rhinitis    Arthritis    "back" (12/29/2014)   BPH (benign prostatic hyperplasia)    Class 3 severe obesity due to excess calories with serious comorbidity and body mass index (BMI) of 40.0 to 44.9 in adult [facility])    DDD (degenerative disc disease), lumbar    Diverticulosis    ED (erectile dysfunction)    Edema    Elevated coronary artery calcium  score    Elevated fasting glucose    Exertional dyspnea    Fatigue    High cholesterol    History of kidney stones    HNP (herniated nucleus pulposus), lumbar    l4-5   HTN (hypertension)    Hypertension    Kidney stone    Leg fracture, right 1979   Leg fracture, right 1967   Liver cyst    Low testosterone     OA (osteoarthritis) of knee    Primary localized osteoarthritis of left knee 12/29/2014   Renal calculus    Sleep apnea    CPAP   Snoring    Vitamin B12 deficiency     PSH:   Past Surgical History:  Procedure Laterality Date   ADENOIDECTOMY  ~ 1970   "not tonsils"    COLONOSCOPY     CYSTOSCOPY W/ STONE MANIPULATION  ~ 2002   LASIK Bilateral 2008   LITHOTRIPSY  X 1   NEPHROLITHOTOMY Left 12/30/2020   Procedure: LEFT NEPHROLITHOTOMY PERCUTANEOUS WTH  SURGEON ACCESS;  Surgeon: Roxane Copp, MD;  Location: [facility] ORS;  Service: Urology;  Laterality: Left;   TOTAL KNEE ARTHROPLASTY Left 12/29/2014   TOTAL KNEE ARTHROPLASTY Left 12/29/2014   Procedure: LEFT TOTAL KNEE ARTHROPLASTY;  Surgeon: Elly Habermann, MD;  Location: [facility] OR;  Service: Orthopedics;  Laterality: Left;    Allergies: No Known Allergies  Medications:   Prior to Admission medications   Medication Sig Start Date End Date Taking? Authorizing Provider  acetaminophen  (TYLENOL ) 500 MG tablet Take 1,000 mg by mouth every 6 (six) hours as needed.   Yes [provider]  aspirin  EC 81 MG tablet Take 81 mg by mouth daily. Swallow whole.   Yes [provider]  cetirizine (ZYRTEC) 10 MG tablet Take 10 mg by mouth daily.   Yes [provider]  dextromethorphan (DELSYM) 30 MG/5ML liquid Take 15 mg by mouth  as needed for cough.   Yes [provider]  fluticasone  (FLONASE ) 50 MCG/ACT nasal spray Place 2 sprays into both nostrils daily.   Yes [provider]  guaiFENesin (MUCINEX) 600 MG 12 hr tablet Take 1,200 mg by mouth 2 (two) times daily as needed for cough or to loosen phlegm.   Yes [provider]  HYDROcodone -acetaminophen  (NORCO/VICODIN) 5-325 MG tablet Take 1 tablet by mouth every 6 hours as needed for 5 days 06/12/23  Yes   montelukast  (SINGULAIR ) 10 MG tablet Take 1 tablet (10 mg total) by mouth daily. 05/19/23  Yes   Multiple Vitamins-Minerals (MENS 50+ MULTI VITAMIN/MIN) TABS Take 1 tablet by mouth daily.   Yes [provider]  olmesartan  (BENICAR ) 40 MG tablet Take 1 tablet (40 mg total) by mouth daily. 12/02/22  Yes   ondansetron  (ZOFRAN -ODT) 4 MG disintegrating tablet Take 1 tablet (4 mg total) by mouth every 8 (eight) hours as needed  for nausea or vomiting. 04/26/23  Yes Mayer, Jodi R, NP  PRESCRIPTION MEDICATION 1 each at bedtime. CPAP with nasal pillow   Yes [provider]  promethazine -dextromethorphan (PROMETHAZINE -DM) 6.25-15 MG/5ML syrup Take 5 mLs by mouth at bedtime as needed for cough. 05/25/23  Yes Alleen Arbour, NP  rosuvastatin  (CRESTOR ) 5 MG tablet Take 1 tablet (5 mg total) by mouth daily. 09/22/22  Yes Hugh Madura, MD  sildenafil  (VIAGRA ) 100 MG tablet Take 1 tablet (100 mg total) by mouth daily as needed. 05/19/23  Yes   tamsulosin  (FLOMAX ) 0.4 MG CAPS capsule Take 1 capsule (0.4 mg total) by mouth daily. 06/12/23  Yes     Discontinued Meds:   Medications Discontinued During This Encounter  Medication Reason   sodium chloride  0.9 % bolus 1,000 mL    celecoxib  (CELEBREX ) 200 MG capsule Patient Preference   cyclobenzaprine  (FLEXERIL ) 10 MG tablet Patient Preference   hydrochlorothiazide  (HYDRODIURIL ) 25 MG tablet Discontinued by provider   HYDROcodone -acetaminophen  (NORCO/VICODIN) 5-325 MG tablet Duplicate   losartan  (COZAAR ) 100 MG tablet Discontinued by provider   metoprolol  tartrate (LOPRESSOR ) 100 MG tablet Discontinued by provider   olmesartan  (BENICAR ) 20 MG tablet Dose change   oxyCODONE  (OXY IR/ROXICODONE ) 5 MG immediate release tablet No longer needed (for PRN medications)   sildenafil  (VIAGRA ) 100 MG tablet Duplicate   sildenafil  (VIAGRA ) 100 MG tablet Duplicate   tadalafil  (CIALIS ) 5 MG tablet Duplicate   tamsulosin  (FLOMAX ) 0.4 MG CAPS capsule Discontinued by provider   testosterone  (ANDROGEL ) 50 MG/5GM (1%) GEL Patient Preference   0.9 %  sodium chloride  infusion    sodium chloride  irrigation 0.9 % Patient Discharge   water for irrigation, sterile for irrigation SOLN Patient Discharge   iohexol  (OMNIPAQUE ) 300 MG/ML solution Patient Discharge    Social History:  reports that he has never smoked. He has never used smokeless tobacco. He reports current alcohol use of about 4.0  standard drinks of alcohol per week. He reports that he does not use drugs.  Family History:   Family History  Problem Relation Age of Onset   Osteoarthritis Mother    Osteoarthritis Father    Hypertension Father    AAA (abdominal aortic aneurysm) Father    Diabetes Sister     Blood pressure 133/79, pulse (!) 57, temperature 98.4 F (36.9 C), resp. rate 18, height 5\' 10"  (1.778 m), weight 136.1 kg, SpO2 100%. General appearance: alert, cooperative, and appears stated age Head: Normocephalic, without obvious abnormality, atraumatic Eyes: no  conjunctival palor. PERRL, EOM's intact  Neck: no adenopathy, no carotid bruit, supple, symmetrical, trachea midline, and thyroid  not enlarged, symmetric, no tenderness/mass/nodules Back: symmetric, no curvature. ROM normal. No CVA tenderness. Resp: clear to auscultation bilaterally Cardio: regular rate and rhythm GI: obese, soft Extremities: extremities normal, atraumatic, no cyanosis or edema Pulses: 2+ and symmetric Skin: Skin color, texture, turgor normal. No rashes or lesions       Modena Bellemare, Alveda Aures, MD 06/13/2023, 2:15 PM

## 2023-06-13 NOTE — Hospital Course (Addendum)
 Adam Avila is a 63 y.o. male with medical history significant of CAD, hypertension, hyperlipidemia, OSA on CPAP, CKD stage IIIa, class III obesity, history of kidney stones, BPH presenting to the ED for evaluation of right-sided abdominal/flank pain and abdominal bloating x 3 days.   He does report decreased urine output but has not noticed any blood in his urine.  He saw his PCP and had outpatient labs done which were concerning for acute kidney injury so he was sent to the ED to be evaluated.  He reports history of kidney stones on the left in the past.  He takes aspirin  81 mg daily but no other blood thinners.   ED Course: Blood pressure elevated with systolic in the 170s but remainder of vital signs stable on arrival. Labs notable for BUN 64, creatinine 9.0 (baseline 1.4), GFR 6, potassium 4.1, bicarb 23, UA pending.  CT showing an obstructive calculus at the right UPJ causing mild to moderate right-sided hydronephrosis. Patient was given Percocet and 500 mL normal saline.   EDP discussed the case with Dr. Inga Manges with urology who requested admission at [facility]

## 2023-06-13 NOTE — Progress Notes (Addendum)
 PROGRESS NOTE    Patient: Adam Avila                            PCP: Jimmey Mould, MD                    DOB: [birth year]            DOA: 06/12/2023 NUU:[id number]             DOS: 06/13/2023, 2:44 PM   LOS: 1 day   Date of Service: The patient was seen and examined on 06/13/2023  Subjective:   The patient was seen and examined this morning. Hemodynamically stable.  Proved right flank pain with IV analgesics No issues overnight .  Addendum: S/p cystoscopy with right ureteral double-J stent   Brief Narrative:   Adam Avila is a 63 y.o. male with medical history significant of CAD, hypertension, hyperlipidemia, OSA on CPAP, CKD stage IIIa, class III obesity, history of kidney stones, BPH presenting to the ED for evaluation of right-sided abdominal/flank pain and abdominal bloating x 3 days.   He does report decreased urine output but has not noticed any blood in his urine.  He saw his PCP and had outpatient labs done which were concerning for acute kidney injury so he was sent to the ED to be evaluated.  He reports history of kidney stones on the left in the past.  He takes aspirin  81 mg daily but no other blood thinners.   ED Course: Blood pressure elevated with systolic in the 170s but remainder of vital signs stable on arrival. Labs notable for BUN 64, creatinine 9.0 (baseline 1.4), GFR 6, potassium 4.1, bicarb 23, UA pending.  CT showing an obstructive calculus at the right UPJ causing mild to moderate right-sided hydronephrosis. Patient was given Percocet and 500 mL normal saline.   EDP discussed the case with Dr. Inga Manges with urology who requested admission at [facility] and planning on taking the patient to the OR tomorrow.     Assessment & Plan:   Principal Problem:   Kidney stone Active Problems:   AKI (acute kidney injury) (HCC)   Hydronephrosis   Bradycardia   CAD (coronary artery disease)  Assessment and Plan   Obstructing kidney stone causing  mild to moderate right-sided hydronephrosis - Rt UPJ Stone   06/13/2023  POD #0 S/P Cystoscopy with right retrograde pyelogram and interpretation. Insertion of right double-J stent.   - continue IVF  - Urologist Dr. Marino Sias following - to OR this a.m.  - CT showing an obstructive calculus at the right UPJ causing mild to moderate right-sided hydronephrosis.    - No signs of sepsis, continue empiric antibiotics of ceftriaxone  - UA unremarkable,   -Continue Flomax .   -Hold home aspirin .  He is not on anticoagulation.       AKI on CKD stage IIIa  - In the setting of obstructing kidney stone with hydronephrosis.  Creatinine 9.0 (baseline 1.4).  GFR now down to 6.  Lab Results  Component Value Date   CREATININE 10.06 (H) 06/13/2023   CREATININE 9.00 (H) 06/12/2023   CREATININE 1.43 (H) 09/13/2022    -  Cont. IVF NS  -  Per pharmacy med rec, patient is no longer on hydrochlorothiazide  and losartan .   Mild bradycardia Heart rate currently in the 50s but patient is asymptomatic.  Cardiac monitoring, EKG,  - TSH elevated to  4.5, obtaining free T3, T4 levels   CAD- stable - Holding aspirin  in anticipation of urologic procedure.  Continue statin.   Hypertension - SBP in 140s.   - Hold olmesartan  due to AKI.  IV hydralazine  PRN SBP >160.    Hyperlipidemia -  Continue Crestor .   OSA - Continue nightly CPAP.   BPH -Continue Flomax .    ----------------------------------------------------------------------------------------------------------------------------------- Nutritional status:  The patient's BMI is: Body mass index is 43.05 kg/m. I agree with the assessment and plan as outlined ------------------------------------------------------------------------------------------------------------------------------------  DVT prophylaxis:  SCDs Start: 06/12/23 2222   Code Status:   Code Status: Full Code  Family Communication: No family member present at bedside- attempt  will be made to update daily The above findings and plan of care has been discussed with patient (and family)  in detail,  they expressed understanding and agreement of above. -Advance care planning has been discussed.   Admission status:   Status is: Inpatient Remains inpatient appropriate because: Needing intervention from urology and consultation from nephrology, IV fluids,   Disposition: From  - home             Planning for discharge in 1-2 days: to Home   Procedures:   No admission procedures for hospital encounter.   Antimicrobials:  Anti-infectives (From admission, onward)    Start     Dose/Rate Route Frequency Ordered Stop   06/12/23 2200  [MAR Hold]  cefTRIAXone (ROCEPHIN) 2 g in sodium chloride  0.9 % 100 mL IVPB        (MAR Hold since Tue 06/13/2023 at 0940.Hold Reason: Transfer to a Procedural area)   2 g 200 mL/hr over 30 Minutes Intravenous Every 24 hours 06/12/23 2158          Medication:   [MAR Hold] Chlorhexidine  Gluconate Cloth  6 each Topical Daily   [MAR Hold] fluticasone   2 spray Each Nare Daily   [MAR Hold] loratadine   10 mg Oral Daily   [MAR Hold] montelukast   10 mg Oral Daily   [MAR Hold] mupirocin ointment  1 Application Nasal BID   oxyCODONE        [MAR Hold] rosuvastatin   5 mg Oral Daily   [MAR Hold] tamsulosin   0.4 mg Oral Daily    acetaminophen , acetaminophen , [MAR Hold] acetaminophen  **OR** [MAR Hold] acetaminophen , [MAR Hold] dextromethorphan, droperidol, [MAR Hold] fentaNYL  (SUBLIMAZE ) injection, fentaNYL  (SUBLIMAZE ) injection, [MAR Hold] hydrALAZINE , [MAR Hold] naLOXone (NARCAN)  injection, [MAR Hold] ondansetron , [MAR Hold] mouth rinse, oxyCODONE , [MAR Hold] oxyCODONE    Objective:   Vitals:   06/13/23 1202 06/13/23 1215 06/13/23 1230 06/13/23 1311  BP: 128/72 120/77 102/89 133/79  Pulse: 74 64 62 (!) 57  Resp: 18 14 13 18   Temp: 97.8 F (36.6 C)  97.8 F (36.6 C) 98.4 F (36.9 C)  TempSrc:      SpO2: 96% 97% 97% 100%  Weight:       Height:        Intake/Output Summary (Last 24 hours) at 06/13/2023 1444 Last data filed at 06/13/2023 1151 Gross per 24 hour  Intake 916.18 ml  Output 450 ml  Net 466.18 ml   Filed Weights   06/12/23 1832  Weight: 136.1 kg     Physical examination:   Constitution:  Alert, cooperative, no distress,  Appears calm and comfortable  Psychiatric:   Normal and stable mood and affect, cognition intact,   HEENT:        Normocephalic, PERRL, otherwise with in Normal limits  Chest:  Chest symmetric Cardio vascular:  S1/S2, RRR, No murmure, No Rubs or Gallops  pulmonary: Clear to auscultation bilaterally, respirations unlabored, negative wheezes / crackles Abdomen: Soft, non-tender, non-distended, bowel sounds,no masses, no organomegaly Muscular skeletal: Right CVA tenderness Limited exam - in bed, able to move all 4 extremities,   Neuro: CNII-XII intact. , normal motor and sensation, reflexes intact  Extremities: No pitting edema lower extremities, +2 pulses  Skin: Dry, warm to touch, negative for any Rashes, No open wounds Wounds: per nursing documentation   ------------------------------------------------------------------------------------------------------------------------------------------    LABs:     Latest Ref Rng & Units 06/13/2023    4:45 AM 06/12/2023    7:40 PM 12/31/2020    4:34 AM  CBC  WBC 4.0 - 10.5 K/uL 9.4  9.1  19.3   Hemoglobin 13.0 - 17.0 g/dL 16.1  09.6  04.5   Hematocrit 39.0 - 52.0 % 41.5  41.9  42.6   Platelets 150 - 400 K/uL 221  239  229       Latest Ref Rng & Units 06/13/2023    4:45 AM 06/12/2023    7:40 PM 09/13/2022    1:56 PM  CMP  Glucose 70 - 99 mg/dL 409  93  84   BUN 8 - 23 mg/dL 65  64  20   Creatinine 0.61 - 1.24 mg/dL 81.19  1.47  8.29   Sodium 135 - 145 mmol/L 139  139  141   Potassium 3.5 - 5.1 mmol/L 5.3  4.1  3.8   Chloride 98 - 111 mmol/L 105  102  101   CO2 22 - 32 mmol/L 20  23  26    Calcium  8.9 - 10.3 mg/dL 9.2  9.8   9.7   Total Protein 6.5 - 8.1 g/dL  7.2    Total Bilirubin 0.0 - 1.2 mg/dL  0.6    Alkaline Phos 38 - 126 U/L  57    AST 15 - 41 U/L  19    ALT 0 - 44 U/L  20         Micro Results Recent Results (from the past 240 hours)  Surgical pcr screen     Status: Abnormal   Collection Time: 06/13/23 12:32 AM   Specimen: Nasal Mucosa; Nasal Swab  Result Value Ref Range Status   MRSA, PCR NEGATIVE NEGATIVE Final   Staphylococcus aureus POSITIVE (A) NEGATIVE Final    Comment: (NOTE) The Xpert SA Assay (FDA approved for NASAL specimens in patients 29 years of age and older), is one component of a comprehensive surveillance program. It is not intended to diagnose infection nor to guide or monitor treatment. Performed at [facility], [address], [place], [place] [address]     Radiology Reports CT Renal Stone Study Result Date: 06/12/2023 CLINICAL DATA:  Abdominal/flank pain, stone suspected EXAM: CT ABDOMEN AND PELVIS WITHOUT CONTRAST TECHNIQUE: Multidetector CT imaging of the abdomen and pelvis was performed following the standard protocol without IV contrast. RADIATION DOSE REDUCTION: This exam was performed according to the departmental dose-optimization program which includes automated exposure control, adjustment of the mA and/or kV according to patient size and/or use of iterative reconstruction technique. COMPARISON:  November 17, 2022, December 31, 2020 FINDINGS: Of note, the lack of intravenous contrast limits evaluation of the solid organ parenchyma and vascularity. Lower chest: No focal airspace consolidation or pleural effusion. Posterior bibasilar dependent atelectasis. Hepatobiliary: No mass. Decompressed gallbladder without radiopaque stones or wall thickening. No intrahepatic or extrahepatic  biliary ductal dilation. Pancreas: No mass or main ductal dilation. No peripancreatic inflammation or fluid collection. Spleen: Normal size. No mass. Adrenals/Urinary Tract: No adrenal  masses. Atrophic left kidney. No renal mass. Punctate nonobstructive calculi within the right kidney. Mild to moderate right-sided hydronephrosis due to an obstructive calculus at the right UPJ, measuring 3 x 3 x 4 mm. Partially distended urinary bladder without visualized abnormality. Stomach/Bowel:Small amount of fluid in the distal esophagus. The stomach contains ingested material without focal abnormality. No small bowel wall thickening or inflammation. No small bowel obstruction. Normal appendix. Descending and sigmoid colonic diverticulosis. No changes of acute diverticulitis. Vascular/Lymphatic: No aortic aneurysm. Scattered aortoiliac atherosclerosis. No intraabdominal or pelvic lymphadenopathy. Reproductive: No prostatomegaly. No free pelvic fluid. Other: No pneumoperitoneum, ascites, or mesenteric inflammation. Musculoskeletal: No acute fracture or destructive lesion. Multilevel degenerative disc disease of the spine. Moderate right hip osteoarthritis. Likely bone island in the left iliac bone. IMPRESSION: 1. Mild to moderate right-sided hydronephrosis due to an obstructive calculus at the right UPJ, measuring 3 x 3 x 4 mm. 2. Small amount of fluid in the distal esophagus, possibly due to incomplete emptying or gastroesophageal reflux disease. 3. Descending and sigmoid colonic diverticulosis. No changes of acute diverticulitis. Electronically Signed   By: Rance Burrows M.D.   On: 06/12/2023 20:18    SIGNED: Bobbetta Burnet, MD, FHM. FAAFP. Arlin Benes - [facility] Time spent - 55 min.  In seeing, evaluating and examining the patient. Reviewing medical records, labs, drawn plan of care. [facility],  Pager (please use [email or web address] to page/ text) Please use Epic Secure Chat for non-urgent communication (7AM-7PM)  If 7PM-7AM, please contact night-coverage [email or web address], 06/13/2023, 2:44 PM

## 2023-06-13 NOTE — Consult Note (Signed)
 Urology Consult Note   Requesting Attending Physician:  Bobbetta Burnet, MD Service Providing Consult: Urology  Consulting Attending: Dr. Inga Manges   Reason for Consult:  right UPJ stone and AKI  HPI: Adam Avila is seen in consultation for reasons noted above at the request of [name], [name], MD. patient is a 63 year old male known to our practice presenting to [facility] emergency department with abdominal/flank pain on the right side x 3 days.  He denies fever, chills, vomiting but has been nauseous.  He has previously been seen by Dr. Valeta Gaudier for a greater than 3 cm left renal stone in a significantly atrophied left kidney.  Per patient's report the kidney is largely nonfunctional.  A split function study is not available to me at this time though that would be consistent with his severe AKI resulting from his right side obstructing stone.  I was able to discuss the case and plan with he and his wife, who joined us  by phone.  He was alert, oriented, no distress.  His pain was well-managed.  He is n.p.o. and is amenable to proceeding to the OR this afternoon.  ------------------  Assessment:  63 y.o. male with right UPJ stone   Recommendations: # Right UPJ stone # AKI  Baseline CKD with minimal left side kidney function.  Serum creatinine greater than 10 as of this morning. 3 x 4 mm obstructing right UPJ stone.  To the OR with Dr. Inga Manges for ureteral stent placement at 1120. Definitive stone management on an outpatient basis. Trend labs. Avoid nephrotoxic medications Urology will follow  Case and plan discussed with Dr. Inga Manges, pt, wife  Past Medical History: Past Medical History:  Diagnosis Date   Allergic rhinitis    Arthritis    "back" (12/29/2014)   BPH (benign prostatic hyperplasia)    Class 3 severe obesity due to excess calories with serious comorbidity and body mass index (BMI) of 40.0 to 44.9 in adult [facility])    DDD (degenerative disc disease), lumbar     Diverticulosis    ED (erectile dysfunction)    Edema    Elevated coronary artery calcium  score    Elevated fasting glucose    Exertional dyspnea    Fatigue    High cholesterol    History of kidney stones    HNP (herniated nucleus pulposus), lumbar    l4-5   HTN (hypertension)    Hypertension    Kidney stone    Leg fracture, right 1979   Leg fracture, right 1967   Liver cyst    Low testosterone     OA (osteoarthritis) of knee    Primary localized osteoarthritis of left knee 12/29/2014   Renal calculus    Sleep apnea    CPAP   Snoring    Vitamin B12 deficiency     Past Surgical History:  Past Surgical History:  Procedure Laterality Date   ADENOIDECTOMY  ~ 1970   "not tonsils"   COLONOSCOPY     CYSTOSCOPY W/ STONE MANIPULATION  ~ 2002   LASIK Bilateral 2008   LITHOTRIPSY  X 1   NEPHROLITHOTOMY Left 12/30/2020   Procedure: LEFT NEPHROLITHOTOMY PERCUTANEOUS WTH  SURGEON ACCESS;  Surgeon: Roxane Copp, MD;  Location: [facility] ORS;  Service: Urology;  Laterality: Left;   TOTAL KNEE ARTHROPLASTY Left 12/29/2014   TOTAL KNEE ARTHROPLASTY Left 12/29/2014   Procedure: LEFT TOTAL KNEE ARTHROPLASTY;  Surgeon: Elly Habermann, MD;  Location: [facility] OR;  Service: Orthopedics;  Laterality: Left;  Medication: Current Facility-Administered Medications  Medication Dose Route Frequency Provider Last Rate Last Admin   0.9 %  sodium chloride  infusion   Intravenous Continuous [name], Seyed A, MD 100 mL/hr at 06/13/23 0814 Rate Change at 06/13/23 6160   acetaminophen  (TYLENOL ) tablet 650 mg  650 mg Oral Q6H PRN Juliette Oh, MD       Or   acetaminophen  (TYLENOL ) suppository 650 mg  650 mg Rectal Q6H PRN Juliette Oh, MD       cefTRIAXone (ROCEPHIN) 2 g in sodium chloride  0.9 % 100 mL IVPB  2 g Intravenous Q24H Juliette Oh, MD 200 mL/hr at 06/12/23 2242 2 g at 06/12/23 2242   Chlorhexidine  Gluconate Cloth 2 % PADS 6 each  6 each Topical Daily Rathore, Vasundhra, MD        dextromethorphan (DELSYM) 30 MG/5ML liquid 15 mg  15 mg Oral Daily PRN Juliette Oh, MD       fentaNYL  (SUBLIMAZE ) injection 12.5 mcg  12.5 mcg Intravenous Q2H PRN Rathore, Vasundhra, MD   12.5 mcg at 06/13/23 7371   fluticasone  (FLONASE ) 50 MCG/ACT nasal spray 2 spray  2 spray Each Nare Daily Juliette Oh, MD       hydrALAZINE  (APRESOLINE ) injection 5 mg  5 mg Intravenous Q4H PRN Juliette Oh, MD       loratadine  (CLARITIN ) tablet 10 mg  10 mg Oral Daily Rathore, Vasundhra, MD       montelukast  (SINGULAIR ) tablet 10 mg  10 mg Oral Daily Rathore, Vasundhra, MD       mupirocin ointment (BACTROBAN) 2 % 1 Application  1 Application Nasal BID Rathore, Vasundhra, MD       naloxone (NARCAN) injection 0.4 mg  0.4 mg Intravenous PRN Rathore, Vasundhra, MD       ondansetron  (ZOFRAN -ODT) disintegrating tablet 4 mg  4 mg Oral Q8H PRN Rathore, Vasundhra, MD       Oral care mouth rinse  15 mL Mouth Rinse PRN Rathore, Vasundhra, MD       oxyCODONE  (Oxy IR/ROXICODONE ) immediate release tablet 5 mg  5 mg Oral Q6H PRN Rathore, Vasundhra, MD   5 mg at 06/13/23 0555   rosuvastatin  (CRESTOR ) tablet 5 mg  5 mg Oral Daily Rathore, Vasundhra, MD       tamsulosin  (FLOMAX ) capsule 0.4 mg  0.4 mg Oral Daily Juliette Oh, MD        Allergies: No Known Allergies  Social History: Social History   Tobacco Use   Smoking status: Never   Smokeless tobacco: Never  Vaping Use   Vaping status: Never Used  Substance Use Topics   Alcohol use: Yes    Alcohol/week: 4.0 standard drinks of alcohol    Types: 2 Glasses of wine, 2 Shots of liquor per week    Comment: 2 a week   Drug use: No    Family History Family History  Problem Relation Age of Onset   Osteoarthritis Mother    Osteoarthritis Father    Hypertension Father    AAA (abdominal aortic aneurysm) Father    Diabetes Sister     Review of Systems  Genitourinary:  Positive for flank pain. Negative for dysuria, frequency, hematuria and  urgency.     Objective   Vital signs in last 24 hours: BP (!) 156/92 (BP Location: Left Arm)   Pulse (!) 53   Temp 98.1 F (36.7 C)   Resp 18   Ht 5\' 10"  (1.778 m)   Wt 136.1 kg   SpO2  100%   BMI 43.05 kg/m   Physical Exam General: A&O, resting, appropriate HEENT: [place]/AT Pulmonary: Normal work of breathing Cardiovascular: no cyanosis Neuro: Appropriate, no focal neurological deficits  Most Recent Labs: Lab Results  Component Value Date   WBC 9.4 06/13/2023   HGB 14.4 06/13/2023   HCT 41.5 06/13/2023   PLT 221 06/13/2023    Lab Results  Component Value Date   NA 139 06/13/2023   K 5.3 (H) 06/13/2023   CL 105 06/13/2023   CO2 20 (L) 06/13/2023   BUN 65 (H) 06/13/2023   CREATININE 10.06 (H) 06/13/2023   CALCIUM  9.2 06/13/2023    Lab Results  Component Value Date   INR 1.08 12/19/2014   APTT 29 12/19/2014     Urine Culture: @[id number] (laburin,org,[id number])@   IMAGING: CT Renal Stone Study Result Date: 06/12/2023 CLINICAL DATA:  Abdominal/flank pain, stone suspected EXAM: CT ABDOMEN AND PELVIS WITHOUT CONTRAST TECHNIQUE: Multidetector CT imaging of the abdomen and pelvis was performed following the standard protocol without IV contrast. RADIATION DOSE REDUCTION: This exam was performed according to the departmental dose-optimization program which includes automated exposure control, adjustment of the mA and/or kV according to patient size and/or use of iterative reconstruction technique. COMPARISON:  November 17, 2022, December 31, 2020 FINDINGS: Of note, the lack of intravenous contrast limits evaluation of the solid organ parenchyma and vascularity. Lower chest: No focal airspace consolidation or pleural effusion. Posterior bibasilar dependent atelectasis. Hepatobiliary: No mass. Decompressed gallbladder without radiopaque stones or wall thickening. No intrahepatic or extrahepatic biliary ductal dilation. Pancreas: No mass or main ductal dilation. No  peripancreatic inflammation or fluid collection. Spleen: Normal size. No mass. Adrenals/Urinary Tract: No adrenal masses. Atrophic left kidney. No renal mass. Punctate nonobstructive calculi within the right kidney. Mild to moderate right-sided hydronephrosis due to an obstructive calculus at the right UPJ, measuring 3 x 3 x 4 mm. Partially distended urinary bladder without visualized abnormality. Stomach/Bowel:Small amount of fluid in the distal esophagus. The stomach contains ingested material without focal abnormality. No small bowel wall thickening or inflammation. No small bowel obstruction. Normal appendix. Descending and sigmoid colonic diverticulosis. No changes of acute diverticulitis. Vascular/Lymphatic: No aortic aneurysm. Scattered aortoiliac atherosclerosis. No intraabdominal or pelvic lymphadenopathy. Reproductive: No prostatomegaly. No free pelvic fluid. Other: No pneumoperitoneum, ascites, or mesenteric inflammation. Musculoskeletal: No acute fracture or destructive lesion. Multilevel degenerative disc disease of the spine. Moderate right hip osteoarthritis. Likely bone island in the left iliac bone. IMPRESSION: 1. Mild to moderate right-sided hydronephrosis due to an obstructive calculus at the right UPJ, measuring 3 x 3 x 4 mm. 2. Small amount of fluid in the distal esophagus, possibly due to incomplete emptying or gastroesophageal reflux disease. 3. Descending and sigmoid colonic diverticulosis. No changes of acute diverticulitis. Electronically Signed   By: Rance Burrows M.D.   On: 06/12/2023 20:18    ------  Alla Ar, NP Pager: [phone]   Please contact the urology consult pager with any further questions/concerns.

## 2023-06-13 NOTE — Discharge Instructions (Signed)
 We will contact you to arrange your next procedure to remove the stone.

## 2023-06-13 NOTE — H&P (Signed)
 Homero Luster, MD Physician Urology   Consult Note    Signed   Date of Service: 06/13/2023 11:00 AM   Signed     Expand All Collapse All     Urology Consult Note    Requesting Attending Physician:  Bobbetta Burnet, MD Service Providing Consult: Urology  Consulting Attending: Dr. Inga Manges     Reason for Consult:  right UPJ stone and AKI   HPI: Adam Avila is seen in consultation for reasons noted above at the request of Bobbetta Burnet, MD. patient is a 63 year old male known to our practice presenting to [facility] emergency department with abdominal/flank pain on the right side x 3 days.  He denies fever, chills, vomiting but has been nauseous.  He has previously been seen by Dr. Valeta Gaudier for a greater than 3 cm left renal stone in a significantly atrophied left kidney.  Per patient's report the kidney is largely nonfunctional.  A split function study is not available to me at this time though that would be consistent with his severe AKI resulting from his right side obstructing stone.   I was able to discuss the case and plan with he and his wife, who joined us  by phone.  He was alert, oriented, no distress.  His pain was well-managed.  He is n.p.o. and is amenable to proceeding to the OR this afternoon.   ------------------   Assessment:   63 y.o. male with right UPJ stone     Recommendations: # Right UPJ stone # AKI   Baseline CKD with minimal left side kidney function.  Serum creatinine greater than 10 as of this morning. 3 x 4 mm obstructing right UPJ stone.  To the OR with Dr. Inga Manges for ureteral stent placement at 1120. Definitive stone management on an outpatient basis. Trend labs. Avoid nephrotoxic medications Urology will follow   Case and plan discussed with Dr. Inga Manges, pt, wife   Past Medical History:     Past Medical History:  Diagnosis Date   Allergic rhinitis     Arthritis      "back" (12/29/2014)   BPH (benign prostatic hyperplasia)      Class 3 severe obesity due to excess calories with serious comorbidity and body mass index (BMI) of 40.0 to 44.9 in adult [facility])     DDD (degenerative disc disease), lumbar     Diverticulosis     ED (erectile dysfunction)     Edema     Elevated coronary artery calcium  score     Elevated fasting glucose     Exertional dyspnea     Fatigue     High cholesterol     History of kidney stones     HNP (herniated nucleus pulposus), lumbar      l4-5   HTN (hypertension)     Hypertension     Kidney stone     Leg fracture, right 1979   Leg fracture, right 1967   Liver cyst     Low testosterone      OA (osteoarthritis) of knee     Primary localized osteoarthritis of left knee 12/29/2014   Renal calculus     Sleep apnea      CPAP   Snoring     Vitamin B12 deficiency            Past Surgical History:       Past Surgical History:  Procedure Laterality Date   ADENOIDECTOMY   ~ 1970    "  not tonsils"   COLONOSCOPY       CYSTOSCOPY W/ STONE MANIPULATION   ~ 2002   LASIK Bilateral 2008   LITHOTRIPSY   X 1   NEPHROLITHOTOMY Left 12/30/2020    Procedure: LEFT NEPHROLITHOTOMY PERCUTANEOUS WTH  SURGEON ACCESS;  Surgeon: Roxane Copp, MD;  Location: [facility] ORS;  Service: Urology;  Laterality: Left;   TOTAL KNEE ARTHROPLASTY Left 12/29/2014   TOTAL KNEE ARTHROPLASTY Left 12/29/2014    Procedure: LEFT TOTAL KNEE ARTHROPLASTY;  Surgeon: Elly Habermann, MD;  Location: [facility] OR;  Service: Orthopedics;  Laterality: Left;          Medication:          Current Facility-Administered Medications  Medication Dose Route Frequency Provider Last Rate Last Admin   0.9 %  sodium chloride  infusion  Intravenous Continuous [name], Seyed A, MD 100 mL/hr at 06/13/23 0814 Rate Change at 06/13/23 0814   0.9 %  sodium chloride  infusion  Intravenous Continuous Peggy Bowens, MD 10 mL/hr at 06/13/23 0956 New Bag at 06/13/23 0956   [MAR Hold] acetaminophen  (TYLENOL ) tablet 650 mg  650 mg Oral Q6H PRN Juliette Oh, MD      Or   Evette Hoes Hold] acetaminophen  (TYLENOL ) suppository 650 mg  650 mg Rectal Q6H PRN Juliette Oh, MD     [MAR Hold] cefTRIAXone (ROCEPHIN) 2 g in sodium chloride  0.9 % 100 mL IVPB  2 g Intravenous Q24H Juliette Oh, MD 200 mL/hr at 06/12/23 2242 2 g at 06/12/23 2242   [MAR Hold] Chlorhexidine  Gluconate Cloth 2 % PADS 6 each  6 each Topical Daily Rathore, Vasundhra, MD     [MAR Hold] dextromethorphan (DELSYM) 30 MG/5ML liquid 15 mg  15 mg Oral Daily PRN Rathore, Vasundhra, MD     [MAR Hold] fentaNYL  (SUBLIMAZE ) injection 12.5 mcg  12.5 mcg Intravenous Q2H PRN Rathore, Vasundhra, MD  12.5 mcg at 06/13/23 0337   [MAR Hold] fluticasone  (FLONASE ) 50 MCG/ACT nasal spray 2 spray  2 spray Each Nare Daily Rathore, Vasundhra, MD     [MAR Hold] hydrALAZINE  (APRESOLINE ) injection 5 mg  5 mg Intravenous Q4H PRN Rathore, Vasundhra, MD     [MAR Hold] loratadine  (CLARITIN ) tablet 10 mg  10 mg Oral Daily Rathore, Vasundhra, MD     [MAR Hold] montelukast  (SINGULAIR ) tablet 10 mg  10 mg Oral Daily Rathore, Vasundhra, MD     [MAR Hold] mupirocin ointment (BACTROBAN) 2 % 1 Application  1 Application Nasal BID Rathore, Vasundhra, MD     [MAR Hold] naloxone (NARCAN) injection 0.4 mg  0.4 mg Intravenous PRN Rathore, Vasundhra, MD     [MAR Hold] ondansetron  (ZOFRAN -ODT) disintegrating tablet 4 mg  4 mg Oral Q8H PRN Juliette Oh, MD     [MAR Hold] Oral care mouth rinse  15 mL Mouth Rinse PRN Rathore, Vasundhra, MD     [MAR Hold] oxyCODONE  (Oxy IR/ROXICODONE ) immediate release tablet 5 mg  5 mg Oral Q6H PRN Rathore, Vasundhra, MD  5 mg at 06/13/23 0555   [MAR Hold] rosuvastatin  (CRESTOR ) tablet 5 mg  5 mg Oral Daily Rathore, Vasundhra, MD     sodium chloride  irrigation 0.9 %    PRN Morgan Rennert, MD  3,000 mL at 06/13/23 1033   [MAR Hold] tamsulosin  (FLOMAX ) capsule 0.4 mg  0.4 mg Oral Daily Juliette Oh, MD     water for irrigation, sterile for irrigation SOLN    PRN Kendahl Bumgardner, MD   1,000 mL at 06/13/23 1032  Allergies: Allergies  No Known Allergies     Social History: Social History  Social History         Tobacco Use   Smoking status: Never   Smokeless tobacco: Never  Vaping Use   Vaping status: Never Used  Substance Use Topics   Alcohol use: Yes      Alcohol/week: 4.0 standard drinks of alcohol      Types: 2 Glasses of wine, 2 Shots of liquor per week      Comment: 2 a week   Drug use: No        Family History      Family History  Problem Relation Age of Onset   Osteoarthritis Mother     Osteoarthritis Father     Hypertension Father     AAA (abdominal aortic aneurysm) Father     Diabetes Sister            Review of Systems  Genitourinary:  Positive for flank pain. Negative for dysuria, frequency, hematuria and urgency.      Objective Vital signs in last 24 hours: BP (!) 141/84   Pulse (!) 56   Temp 98.4 F (36.9 C) (Oral)   Resp 18   Ht 5\' 10"  (1.778 m)   Wt 136.1 kg   SpO2 98%   BMI 43.05 kg/m    Physical Exam General: A&O, resting, appropriate HEENT: [place]/AT Pulmonary: Normal work of breathing Cardiovascular: no cyanosis Neuro: Appropriate, no focal neurological deficits   Most Recent Labs: Recent Labs       Lab Results  Component Value Date    WBC 9.4 06/13/2023    HGB 14.4 06/13/2023    HCT 41.5 06/13/2023    PLT 221 06/13/2023        Recent Labs       Lab Results  Component Value Date    NA 139 06/13/2023    K 5.3 (H) 06/13/2023    CL 105 06/13/2023    CO2 20 (L) 06/13/2023    BUN 65 (H) 06/13/2023    CREATININE 10.06 (H) 06/13/2023    CALCIUM  9.2 06/13/2023        Recent Labs       Lab Results  Component Value Date    INR 1.08 12/19/2014    APTT 29 12/19/2014          Urine Culture: @[id number] (laburin,org,[id number])@    IMAGING:  Imaging Results (Last 48 hours)  CT Renal Stone Study Result Date: 06/12/2023 CLINICAL DATA:  Abdominal/flank pain, stone suspected EXAM: CT  ABDOMEN AND PELVIS WITHOUT CONTRAST TECHNIQUE: Multidetector CT imaging of the abdomen and pelvis was performed following the standard protocol without IV contrast. RADIATION DOSE REDUCTION: This exam was performed according to the departmental dose-optimization program which includes automated exposure control, adjustment of the mA and/or kV according to patient size and/or use of iterative reconstruction technique. COMPARISON:  November 17, 2022, December 31, 2020 FINDINGS: Of note, the lack of intravenous contrast limits evaluation of the solid organ parenchyma and vascularity. Lower chest: No focal airspace consolidation or pleural effusion. Posterior bibasilar dependent atelectasis. Hepatobiliary: No mass. Decompressed gallbladder without radiopaque stones or wall thickening. No intrahepatic or extrahepatic biliary ductal dilation. Pancreas: No mass or main ductal dilation. No peripancreatic inflammation or fluid collection. Spleen: Normal size. No mass. Adrenals/Urinary Tract: No adrenal masses. Atrophic left kidney. No renal mass. Punctate nonobstructive calculi within the right kidney. Mild to moderate right-sided hydronephrosis due to an obstructive calculus at the right  UPJ, measuring 3 x 3 x 4 mm. Partially distended urinary bladder without visualized abnormality. Stomach/Bowel:Small amount of fluid in the distal esophagus. The stomach contains ingested material without focal abnormality. No small bowel wall thickening or inflammation. No small bowel obstruction. Normal appendix. Descending and sigmoid colonic diverticulosis. No changes of acute diverticulitis. Vascular/Lymphatic: No aortic aneurysm. Scattered aortoiliac atherosclerosis. No intraabdominal or pelvic lymphadenopathy. Reproductive: No prostatomegaly. No free pelvic fluid. Other: No pneumoperitoneum, ascites, or mesenteric inflammation. Musculoskeletal: No acute fracture or destructive lesion. Multilevel degenerative disc disease of the spine.  Moderate right hip osteoarthritis. Likely bone island in the left iliac bone. IMPRESSION: 1. Mild to moderate right-sided hydronephrosis due to an obstructive calculus at the right UPJ, measuring 3 x 3 x 4 mm. 2. Small amount of fluid in the distal esophagus, possibly due to incomplete emptying or gastroesophageal reflux disease. 3. Descending and sigmoid colonic diverticulosis. No changes of acute diverticulitis. Electronically Signed   By: Rance Burrows M.D.   On: 06/12/2023 20:18       ------   Alla Ar, NP Pager: [phone]   Please contact the urology consult pager with any further questions/concerns.           Routing History

## 2023-06-13 NOTE — Transfer of Care (Signed)
 Immediate Anesthesia Transfer of Care Note  Patient: Adam Avila  Procedure(s) Performed: CYSTOSCOPY, WITH RETROGRADE PYELOGRAM AND URETERAL STENT INSERTION (Right)  Patient Location: PACU  Anesthesia Type:General  Level of Consciousness: awake, alert , and oriented  Airway & Oxygen Therapy: Patient Spontanous Breathing  Post-op Assessment: Report given to RN and Post -op Vital signs reviewed and stable  Post vital signs: Reviewed and stable  Last Vitals:  Vitals Value Taken Time  BP 128/72 06/13/23 1202  Temp    Pulse 73 06/13/23 1204  Resp 18 06/13/23 1204  SpO2 97 % 06/13/23 1204  Vitals shown include unfiled device data.  Last Pain:  Vitals:   06/13/23 0953  TempSrc:   PainSc: 5       Patients Stated Pain Goal: 2 (06/13/23 0555)  Complications: No notable events documented.

## 2023-06-13 NOTE — Consult Note (Signed)
 Urology Consult Note   Requesting Attending Physician:  Adam Burnet, Avila Service Providing Consult: Urology  Consulting Attending: Dr. Inga Avila   Reason for Consult:  right UPJ stone and AKI  HPI: Adam Avila is seen in consultation for reasons noted above at the request of [name], [name], Avila. patient is Avila 63 year old male known to our practice presenting to [facility] emergency department with abdominal/flank pain on the right side x 3 days.  He denies fever, chills, vomiting but has been nauseous.  He has previously been seen by Dr. Valeta Avila for Avila greater than 3 cm left renal stone in Avila significantly atrophied left kidney.  Per patient's report the kidney is largely nonfunctional.  Avila split function study is not available to me at this time though that would be consistent with his severe AKI resulting from his right side obstructing stone.  I was able to discuss the case and plan with he and his wife, who joined us  by phone.  He was alert, oriented, no distress.  His pain was well-managed.  He is n.p.o. and is amenable to proceeding to the OR this afternoon.  ------------------  Assessment:  63 y.o. male with right UPJ stone   Recommendations: # Right UPJ stone # AKI  Baseline CKD with minimal left side kidney function.  Serum creatinine greater than 10 as of this morning. 3 x 4 mm obstructing right UPJ stone.  To the OR with Dr. Inga Avila for ureteral stent placement at 1120. Definitive stone management on an outpatient basis. Trend labs. Avoid nephrotoxic medications Urology will follow  Case and plan discussed with Dr. Inga Avila, pt, wife  Past Medical History: Past Medical History:  Diagnosis Date   Allergic rhinitis    Arthritis    "back" (12/29/2014)   BPH (benign prostatic hyperplasia)    Class 3 severe obesity due to excess calories with serious comorbidity and body mass index (BMI) of 40.0 to 44.9 in adult [facility])    DDD (degenerative disc disease), lumbar     Diverticulosis    ED (erectile dysfunction)    Edema    Elevated coronary artery calcium  score    Elevated fasting glucose    Exertional dyspnea    Fatigue    High cholesterol    History of kidney stones    HNP (herniated nucleus pulposus), lumbar    l4-5   HTN (hypertension)    Hypertension    Kidney stone    Leg fracture, right 1979   Leg fracture, right 1967   Liver cyst    Low testosterone     OA (osteoarthritis) of knee    Primary localized osteoarthritis of left knee 12/29/2014   Renal calculus    Sleep apnea    CPAP   Snoring    Vitamin B12 deficiency     Past Surgical History:  Past Surgical History:  Procedure Laterality Date   ADENOIDECTOMY  ~ 1970   "not tonsils"   COLONOSCOPY     CYSTOSCOPY W/ STONE MANIPULATION  ~ 2002   LASIK Bilateral 2008   LITHOTRIPSY  X 1   NEPHROLITHOTOMY Left 12/30/2020   Procedure: LEFT NEPHROLITHOTOMY PERCUTANEOUS WTH  SURGEON ACCESS;  Surgeon: Adam Copp, Avila;  Location: [facility] ORS;  Service: Urology;  Laterality: Left;   TOTAL KNEE ARTHROPLASTY Left 12/29/2014   TOTAL KNEE ARTHROPLASTY Left 12/29/2014   Procedure: LEFT TOTAL KNEE ARTHROPLASTY;  Surgeon: Adam Habermann, Avila;  Location: [facility] OR;  Service: Orthopedics;  Laterality: Left;  Medication: Current Facility-Administered Medications  Medication Dose Route Frequency Provider Last Rate Last Admin   0.9 %  sodium chloride  infusion   Intravenous Continuous [name], Adam Avila 100 mL/hr at 06/13/23 0814 Rate Change at 06/13/23 0814   0.9 %  sodium chloride  infusion   Intravenous Continuous Adam Bowens, Avila 10 mL/hr at 06/13/23 0956 New Bag at 06/13/23 0956   [MAR Hold] acetaminophen  (TYLENOL ) tablet 650 mg  650 mg Oral Q6H PRN Adam Oh, Avila       Or   Adam Avila Hold] acetaminophen  (TYLENOL ) suppository 650 mg  650 mg Rectal Q6H PRN Adam Oh, Avila       [MAR Hold] cefTRIAXone (ROCEPHIN) 2 g in sodium chloride  0.9 % 100 mL IVPB  2 g Intravenous Q24H  Adam Oh, Avila 200 mL/hr at 06/12/23 2242 2 g at 06/12/23 2242   [MAR Hold] Chlorhexidine  Gluconate Cloth 2 % PADS 6 each  6 each Topical Daily Rathore, Vasundhra, Avila       [MAR Hold] dextromethorphan (DELSYM) 30 MG/5ML liquid 15 mg  15 mg Oral Daily PRN Rathore, Vasundhra, Avila       [MAR Hold] fentaNYL  (SUBLIMAZE ) injection 12.5 mcg  12.5 mcg Intravenous Q2H PRN Rathore, Vasundhra, Avila   12.5 mcg at 06/13/23 0337   [MAR Hold] fluticasone  (FLONASE ) 50 MCG/ACT nasal spray 2 spray  2 spray Each Nare Daily Rathore, Vasundhra, Avila       [MAR Hold] hydrALAZINE  (APRESOLINE ) injection 5 mg  5 mg Intravenous Q4H PRN Rathore, Vasundhra, Avila       [MAR Hold] loratadine  (CLARITIN ) tablet 10 mg  10 mg Oral Daily Rathore, Vasundhra, Avila       [MAR Hold] montelukast  (SINGULAIR ) tablet 10 mg  10 mg Oral Daily Rathore, Vasundhra, Avila       [MAR Hold] mupirocin ointment (BACTROBAN) 2 % 1 Application  1 Application Nasal BID Rathore, Vasundhra, Avila       [MAR Hold] naloxone (NARCAN) injection 0.4 mg  0.4 mg Intravenous PRN Rathore, Vasundhra, Avila       [MAR Hold] ondansetron  (ZOFRAN -ODT) disintegrating tablet 4 mg  4 mg Oral Q8H PRN Adam Oh, Avila       [MAR Hold] Oral care mouth rinse  15 mL Mouth Rinse PRN Rathore, Vasundhra, Avila       [MAR Hold] oxyCODONE  (Oxy IR/ROXICODONE ) immediate release tablet 5 mg  5 mg Oral Q6H PRN Rathore, Vasundhra, Avila   5 mg at 06/13/23 0555   [MAR Hold] rosuvastatin  (CRESTOR ) tablet 5 mg  5 mg Oral Daily Rathore, Vasundhra, Avila       sodium chloride  irrigation 0.9 %    PRN Adam Georgia, Avila   3,000 mL at 06/13/23 1033   [MAR Hold] tamsulosin  (FLOMAX ) capsule 0.4 mg  0.4 mg Oral Daily Adam Oh, Avila       water for irrigation, sterile for irrigation SOLN    PRN Adam Klinger, Avila   1,000 mL at 06/13/23 1032    Allergies: No Known Allergies  Social History: Social History   Tobacco Use   Smoking status: Never   Smokeless tobacco: Never  Vaping Use   Vaping status:  Never Used  Substance Use Topics   Alcohol use: Yes    Alcohol/week: 4.0 standard drinks of alcohol    Types: 2 Glasses of wine, 2 Shots of liquor per week    Comment: 2 Avila week   Drug use: No    Family History Family History  Problem  Relation Age of Onset   Osteoarthritis Mother    Osteoarthritis Father    Hypertension Father    AAA (abdominal aortic aneurysm) Father    Diabetes Sister     Review of Systems  Genitourinary:  Positive for flank pain. Negative for dysuria, frequency, hematuria and urgency.     Objective   Vital signs in last 24 hours: BP (!) 141/84   Pulse (!) 56   Temp 98.4 F (36.9 C) (Oral)   Resp 18   Ht 5\' 10"  (1.778 m)   Wt 136.1 kg   SpO2 98%   BMI 43.05 kg/m   Physical Exam General: Avila&O, resting, appropriate HEENT: [place]/AT Pulmonary: Normal work of breathing Cardiovascular: no cyanosis Neuro: Appropriate, no focal neurological deficits  Most Recent Labs: Lab Results  Component Value Date   WBC 9.4 06/13/2023   HGB 14.4 06/13/2023   HCT 41.5 06/13/2023   PLT 221 06/13/2023    Lab Results  Component Value Date   NA 139 06/13/2023   K 5.3 (H) 06/13/2023   CL 105 06/13/2023   CO2 20 (L) 06/13/2023   BUN 65 (H) 06/13/2023   CREATININE 10.06 (H) 06/13/2023   CALCIUM  9.2 06/13/2023    Lab Results  Component Value Date   INR 1.08 12/19/2014   APTT 29 12/19/2014     Urine Culture: @[id number] (laburin,org,[id number])@   IMAGING: CT Renal Stone Study Result Date: 06/12/2023 CLINICAL DATA:  Abdominal/flank pain, stone suspected EXAM: CT ABDOMEN AND PELVIS WITHOUT CONTRAST TECHNIQUE: Multidetector CT imaging of the abdomen and pelvis was performed following the standard protocol without IV contrast. RADIATION DOSE REDUCTION: This exam was performed according to the departmental dose-optimization program which includes automated exposure control, adjustment of the mA and/or kV according to patient size and/or use of iterative  reconstruction technique. COMPARISON:  November 17, 2022, December 31, 2020 FINDINGS: Of note, the lack of intravenous contrast limits evaluation of the solid organ parenchyma and vascularity. Lower chest: No focal airspace consolidation or pleural effusion. Posterior bibasilar dependent atelectasis. Hepatobiliary: No mass. Decompressed gallbladder without radiopaque stones or wall thickening. No intrahepatic or extrahepatic biliary ductal dilation. Pancreas: No mass or main ductal dilation. No peripancreatic inflammation or fluid collection. Spleen: Normal size. No mass. Adrenals/Urinary Tract: No adrenal masses. Atrophic left kidney. No renal mass. Punctate nonobstructive calculi within the right kidney. Mild to moderate right-sided hydronephrosis due to an obstructive calculus at the right UPJ, measuring 3 x 3 x 4 mm. Partially distended urinary bladder without visualized abnormality. Stomach/Bowel:Small amount of fluid in the distal esophagus. The stomach contains ingested material without focal abnormality. No small bowel wall thickening or inflammation. No small bowel obstruction. Normal appendix. Descending and sigmoid colonic diverticulosis. No changes of acute diverticulitis. Vascular/Lymphatic: No aortic aneurysm. Scattered aortoiliac atherosclerosis. No intraabdominal or pelvic lymphadenopathy. Reproductive: No prostatomegaly. No free pelvic fluid. Other: No pneumoperitoneum, ascites, or mesenteric inflammation. Musculoskeletal: No acute fracture or destructive lesion. Multilevel degenerative disc disease of the spine. Moderate right hip osteoarthritis. Likely bone island in the left iliac bone. IMPRESSION: 1. Mild to moderate right-sided hydronephrosis due to an obstructive calculus at the right UPJ, measuring 3 x 3 x 4 mm. 2. Small amount of fluid in the distal esophagus, possibly due to incomplete emptying or gastroesophageal reflux disease. 3. Descending and sigmoid colonic diverticulosis. No changes of  acute diverticulitis. Electronically Signed   By: Rance Burrows M.D.   On: 06/12/2023 20:18    ------  Alla Ar, NP Pager: [phone]  Please contact the urology consult pager with any further questions/concerns.

## 2023-06-13 NOTE — Anesthesia Postprocedure Evaluation (Signed)
 Anesthesia Post Note  Patient: Adam Avila  Procedure(s) Performed: CYSTOSCOPY, WITH RETROGRADE PYELOGRAM AND URETERAL STENT INSERTION (Right)     Patient location during evaluation: PACU Anesthesia Type: General Level of consciousness: awake and alert and oriented Pain management: pain level controlled Vital Signs Assessment: post-procedure vital signs reviewed and stable Respiratory status: spontaneous breathing, nonlabored ventilation and respiratory function stable Cardiovascular status: blood pressure returned to baseline and stable Postop Assessment: no apparent nausea or vomiting Anesthetic complications: no   No notable events documented.  Last Vitals:  Vitals:   06/13/23 1230 06/13/23 1311  BP: 102/89 133/79  Pulse: 62 (!) 57  Resp: 13 18  Temp: 36.6 C 36.9 C  SpO2: 97% 100%    Last Pain:  Vitals:   06/13/23 1202  TempSrc:   PainSc: 0-No pain   Pain Goal: Patients Stated Pain Goal: 2 (06/13/23 0555)                 Khyrin Trevathan A.

## 2023-06-14 ENCOUNTER — Encounter (HOSPITAL_COMMUNITY): Payer: Self-pay | Admitting: Urology

## 2023-06-14 DIAGNOSIS — N200 Calculus of kidney: Secondary | ICD-10-CM | POA: Diagnosis not present

## 2023-06-14 DIAGNOSIS — N189 Chronic kidney disease, unspecified: Secondary | ICD-10-CM | POA: Diagnosis not present

## 2023-06-14 DIAGNOSIS — N179 Acute kidney failure, unspecified: Secondary | ICD-10-CM

## 2023-06-14 LAB — BASIC METABOLIC PANEL WITH GFR
Anion gap: 7 (ref 5–15)
BUN: 51 mg/dL — ABNORMAL HIGH (ref 8–23)
CO2: 19 mmol/L — ABNORMAL LOW (ref 22–32)
Calcium: 8.6 mg/dL — ABNORMAL LOW (ref 8.9–10.3)
Chloride: 111 mmol/L (ref 98–111)
Creatinine, Ser: 4.92 mg/dL — ABNORMAL HIGH (ref 0.61–1.24)
GFR, Estimated: 12 mL/min — ABNORMAL LOW (ref >60)
Glucose, Bld: 140 mg/dL — ABNORMAL HIGH (ref 70–99)
Potassium: 5.3 mmol/L — ABNORMAL HIGH (ref 3.5–5.1)
Sodium: 137 mmol/L (ref 135–145)

## 2023-06-14 NOTE — Plan of Care (Signed)
   Problem: Education: Goal: Knowledge of General Education information will improve Description: Including pain rating scale, medication(s)/side effects and non-pharmacologic comfort measures Outcome: Completed/Met

## 2023-06-14 NOTE — Progress Notes (Signed)
 Adam Avila is an 63 y.o. male  BPH, HTN, OSA, nephrolithiasis atrophied left kidney p/w rt flank discomfort  found to have a stone ion the right UPJ s/p stent deployment by urology on 4/29.   Assessment/Plan: AKI on CKD3a with a baseline creatinine of 1.4-1.55 with acute component secondary to obstructive uropathy which if his history is correct should be reversible.  - For now will keep on a renal diet until we see improvement in renal function. Already great UOP so likely represents postobstructive diuresis. - Improving renal function, great UOP. - Will pull foley and follow I&O's.  - He's tolerating PO's.  - Will have him see Dr. Jearldine Mina in 2-4 weeks; he's functioning with only the RK.  Signing off at this time; please reconsult as needed.  -Monitor Daily I/Os, Daily weight  -Maintain MAP>65 for optimal renal perfusion.  - Avoid nephrotoxic agents such as IV contrast, NSAIDs, and phosphate containing bowel preps (FLEETS)   Bradycardia CASHD - no active angina by history. HTN - holding olmesartan  for now. OSA BPH - continue flomax    Subjective: 4.5L UOP /24hr    Chemistry and CBC: Creatinine, Ser  Date/Time Value Ref Range Status  06/14/2023 04:46 AM 4.92 (H) 0.61 - 1.24 mg/dL Final    Comment:    DELTA CHECK NOTED  06/13/2023 04:45 AM 10.06 (H) 0.61 - 1.24 mg/dL Final  57/84/6962 95:28 PM 9.00 (H) 0.61 - 1.24 mg/dL Final  41/32/4401 02:72 PM 1.43 (H) 0.76 - 1.27 mg/dL Final  53/66/4403 47:42 PM 1.46 (H) 0.76 - 1.27 mg/dL Final  59/56/3875 64:33 AM 1.51 (H) 0.76 - 1.27 mg/dL Final  29/51/8841 66:06 AM 1.27 (H) 0.61 - 1.24 mg/dL Final  30/16/0109 32:35 PM 1.22 0.61 - 1.24 mg/dL Final  57/32/2025 42:70 PM 1.09 0.61 - 1.24 mg/dL Final  62/37/6283 15:17 AM 0.84 0.61 - 1.24 mg/dL Final  61/60/7371 06:26 AM 1.03 0.61 - 1.24 mg/dL Final   Recent Labs  Lab 06/12/23 1940 06/13/23 0445 06/14/23 0446  NA 139 139 137  K 4.1 5.3* 5.3*  CL 102 105 111  CO2 23 20* 19*   GLUCOSE 93 105* 140*  BUN 64* 65* 51*  CREATININE 9.00* 10.06* 4.92*  CALCIUM  9.8 9.2 8.6*   Recent Labs  Lab 06/12/23 1940 06/13/23 0445  WBC 9.1 9.4  NEUTROABS 5.9  --   HGB 14.3 14.4  HCT 41.9 41.5  MCV 92.3 91.4  PLT 239 221   Liver Function Tests: Recent Labs  Lab 06/12/23 1940  AST 19  ALT 20  ALKPHOS 57  BILITOT 0.6  PROT 7.2  ALBUMIN 3.8   No results for input(s): "LIPASE", "AMYLASE" in the last 168 hours. No results for input(s): "AMMONIA" in the last 168 hours. Cardiac Enzymes: No results for input(s): "CKTOTAL", "CKMB", "CKMBINDEX", "TROPONINI" in the last 168 hours. Iron Studies: No results for input(s): "IRON", "TIBC", "TRANSFERRIN", "FERRITIN" in the last 72 hours. PT/INR: @LABRCNTIP (inr:5)  Xrays/Other Studies: ) Results for orders placed or performed during the hospital encounter of 06/12/23 (from the past 48 hours)  CBC with Differential     Status: None   Collection Time: 06/12/23  7:40 PM  Result Value Ref Range   WBC 9.1 4.0 - 10.5 K/uL   RBC 4.54 4.22 - 5.81 MIL/uL   Hemoglobin 14.3 13.0 - 17.0 g/dL   HCT 94.8 54.6 - 27.0 %   MCV 92.3 80.0 - 100.0 fL   MCH 31.5 26.0 - 34.0 pg   MCHC  34.1 30.0 - 36.0 g/dL   RDW 16.1 09.6 - 04.5 %   Platelets 239 150 - 400 K/uL   nRBC 0.0 0.0 - 0.2 %   Neutrophils Relative % 66 %   Neutro Abs 5.9 1.7 - 7.7 K/uL   Lymphocytes Relative 19 %   Lymphs Abs 1.8 0.7 - 4.0 K/uL   Monocytes Relative 11 %   Monocytes Absolute 1.0 0.1 - 1.0 K/uL   Eosinophils Relative 3 %   Eosinophils Absolute 0.3 0.0 - 0.5 K/uL   Basophils Relative 0 %   Basophils Absolute 0.0 0.0 - 0.1 K/uL   Immature Granulocytes 1 %   Abs Immature Granulocytes 0.06 0.00 - 0.07 K/uL    Comment: Performed at [facility], [address] [place], [place] [address]  Comprehensive metabolic panel     Status: Abnormal   Collection Time: 06/12/23  7:40 PM  Result Value Ref Range   Sodium 139 135 - 145 mmol/L   Potassium  4.1 3.5 - 5.1 mmol/L   Chloride 102 98 - 111 mmol/L   CO2 23 22 - 32 mmol/L   Glucose, Bld 93 70 - 99 mg/dL    Comment: Glucose reference range applies only to samples taken after fasting for at least 8 hours.   BUN 64 (H) 8 - 23 mg/dL   Creatinine, Ser 1.91 (H) 0.61 - 1.24 mg/dL   Calcium  9.8 8.9 - 10.3 mg/dL   Total Protein 7.2 6.5 - 8.1 g/dL   Albumin 3.8 3.5 - 5.0 g/dL   AST 19 15 - 41 U/L   ALT 20 0 - 44 U/L   Alkaline Phosphatase 57 38 - 126 U/L   Total Bilirubin 0.6 0.0 - 1.2 mg/dL   GFR, Estimated 6 (L) >60 mL/min    Comment: (NOTE) Calculated using the CKD-EPI Creatinine Equation (2021)    Anion gap 14 5 - 15    Comment: Performed at [facility], [address], [place], [place] [address]  Urinalysis, Routine w reflex microscopic -Urine, Clean Catch     Status: Abnormal   Collection Time: 06/12/23  7:41 PM  Result Value Ref Range   Color, Urine AMBER (A) YELLOW    Comment: BIOCHEMICALS MAY BE AFFECTED BY COLOR   APPearance CLEAR CLEAR   Specific Gravity, Urine 1.011 1.005 - 1.030   pH 6.0 5.0 - 8.0   Glucose, UA NEGATIVE NEGATIVE mg/dL   Hgb urine dipstick NEGATIVE NEGATIVE   Bilirubin Urine NEGATIVE NEGATIVE   Ketones, ur NEGATIVE NEGATIVE mg/dL   Protein, ur NEGATIVE NEGATIVE mg/dL   Nitrite NEGATIVE NEGATIVE   Leukocytes,Ua NEGATIVE NEGATIVE    Comment: Performed at [facility], [address], [place], [place] [address]  Surgical pcr screen     Status: Abnormal   Collection Time: 06/13/23 12:32 AM   Specimen: Nasal Mucosa; Nasal Swab  Result Value Ref Range   MRSA, PCR NEGATIVE NEGATIVE   Staphylococcus aureus POSITIVE (A) NEGATIVE    Comment: (NOTE) The Xpert SA Assay (FDA approved for NASAL specimens in patients 61 years of age and older), is one component of a comprehensive surveillance program. It is not intended to diagnose infection nor to guide or monitor treatment. Performed at [facility], [address], [place], [place] [address]   Basic metabolic panel     Status: Abnormal   Collection Time: 06/13/23  4:45 AM  Result Value Ref Range   Sodium 139  135 - 145 mmol/L   Potassium 5.3 (H) 3.5 - 5.1 mmol/L   Chloride 105 98 - 111 mmol/L   CO2 20 (L) 22 - 32 mmol/L   Glucose, Bld 105 (H) 70 - 99 mg/dL    Comment: Glucose reference range applies only to samples taken after fasting for at least 8 hours.   BUN 65 (H) 8 - 23 mg/dL   Creatinine, Ser 16.10 (H) 0.61 - 1.24 mg/dL   Calcium  9.2 8.9 - 10.3 mg/dL   GFR, Estimated 5 (L) >60 mL/min    Comment: (NOTE) Calculated using the CKD-EPI Creatinine Equation (2021)    Anion gap 14 5 - 15    Comment: Performed at [facility], [address] [place], [place] [address]  CBC     Status: None   Collection Time: 06/13/23  4:45 AM  Result Value Ref Range   WBC 9.4 4.0 - 10.5 K/uL   RBC 4.54 4.22 - 5.81 MIL/uL   Hemoglobin 14.4 13.0 - 17.0 g/dL   HCT 40.9 81.1 - 91.4 %   MCV 91.4 80.0 - 100.0 fL   MCH 31.7 26.0 - 34.0 pg   MCHC 34.7 30.0 - 36.0 g/dL   RDW 78.2 95.6 - 21.3 %   Platelets 221 150 - 400 K/uL   nRBC 0.0 0.0 - 0.2 %    Comment: Performed at [facility], [address], [place], [place] [address]  TSH     Status: Abnormal   Collection Time: 06/13/23  4:45 AM  Result Value Ref Range   TSH 4.513 (H) 0.350 - 4.500 uIU/mL    Comment: Performed by a 3rd Generation assay with a functional sensitivity of <=0.01 uIU/mL. Performed at [facility], [address] [place], [place] [address]   HIV Antibody (routine testing w rflx)     Status: None   Collection Time: 06/13/23  4:45 AM  Result Value Ref Range   HIV Screen 4th Generation wRfx Non Reactive Non Reactive    Comment: Performed at [facility], [address], [place], [place] [address]  Basic metabolic panel with GFR     Status: Abnormal   Collection Time: 06/14/23  4:46 AM  Result Value Ref Range   Sodium 137 135 - 145 mmol/L   Potassium 5.3 (H)  3.5 - 5.1 mmol/L   Chloride 111 98 - 111 mmol/L   CO2 19 (L) 22 - 32 mmol/L   Glucose, Bld 140 (H) 70 - 99 mg/dL    Comment: Glucose reference range applies only to samples taken after fasting for at least 8 hours.   BUN 51 (H) 8 - 23 mg/dL   Creatinine, Ser 4.13 (H) 0.61 - 1.24 mg/dL    Comment: DELTA CHECK NOTED   Calcium  8.6 (L) 8.9 - 10.3 mg/dL   GFR, Estimated 12 (L) >60 mL/min    Comment: (NOTE) Calculated using the CKD-EPI Creatinine Equation (2021)    Anion gap 7 5 - 15    Comment: Performed at [facility], [address] [place], [place] [address]   DG C-Arm 1-60 Min Result Date: 06/13/2023 CLINICAL DATA:  Right-side cysto with stent placement. EXAM: DG C-ARM 1-60 MIN COMPARISON:  Abdominopelvic CT 06/12/2023. FINDINGS: C-arm fluoroscopy was provided in the operating room without the presence of a radiologist.28 seconds fluoroscopy time. 20.57 mGy air kerma. Two C-arm fluoroscopic images were obtained intraoperatively and are submitted for post operative interpretation. On the  submitted images, contrast is noted in the right ureter, and subsequent image demonstrates a probable right ureteral stent. Field of view and limited extent of the submitted images limit diagnostic information. Please see intraoperative findings for further detail. IMPRESSION: Intraoperative C-arm fluoroscopy as described. Electronically Signed   By: Elmon Hagedorn M.D.   On: 06/13/2023 16:13   CT Renal Stone Study Result Date: 06/12/2023 CLINICAL DATA:  Abdominal/flank pain, stone suspected EXAM: CT ABDOMEN AND PELVIS WITHOUT CONTRAST TECHNIQUE: Multidetector CT imaging of the abdomen and pelvis was performed following the standard protocol without IV contrast. RADIATION DOSE REDUCTION: This exam was performed according to the departmental dose-optimization program which includes automated exposure control, adjustment of the mA and/or kV according to patient size and/or use of iterative reconstruction  technique. COMPARISON:  November 17, 2022, December 31, 2020 FINDINGS: Of note, the lack of intravenous contrast limits evaluation of the solid organ parenchyma and vascularity. Lower chest: No focal airspace consolidation or pleural effusion. Posterior bibasilar dependent atelectasis. Hepatobiliary: No mass. Decompressed gallbladder without radiopaque stones or wall thickening. No intrahepatic or extrahepatic biliary ductal dilation. Pancreas: No mass or main ductal dilation. No peripancreatic inflammation or fluid collection. Spleen: Normal size. No mass. Adrenals/Urinary Tract: No adrenal masses. Atrophic left kidney. No renal mass. Punctate nonobstructive calculi within the right kidney. Mild to moderate right-sided hydronephrosis due to an obstructive calculus at the right UPJ, measuring 3 x 3 x 4 mm. Partially distended urinary bladder without visualized abnormality. Stomach/Bowel:Small amount of fluid in the distal esophagus. The stomach contains ingested material without focal abnormality. No small bowel wall thickening or inflammation. No small bowel obstruction. Normal appendix. Descending and sigmoid colonic diverticulosis. No changes of acute diverticulitis. Vascular/Lymphatic: No aortic aneurysm. Scattered aortoiliac atherosclerosis. No intraabdominal or pelvic lymphadenopathy. Reproductive: No prostatomegaly. No free pelvic fluid. Other: No pneumoperitoneum, ascites, or mesenteric inflammation. Musculoskeletal: No acute fracture or destructive lesion. Multilevel degenerative disc disease of the spine. Moderate right hip osteoarthritis. Likely bone island in the left iliac bone. IMPRESSION: 1. Mild to moderate right-sided hydronephrosis due to an obstructive calculus at the right UPJ, measuring 3 x 3 x 4 mm. 2. Small amount of fluid in the distal esophagus, possibly due to incomplete emptying or gastroesophageal reflux disease. 3. Descending and sigmoid colonic diverticulosis. No changes of acute  diverticulitis. Electronically Signed   By: Rance Burrows M.D.   On: 06/12/2023 20:18    PMH:   Past Medical History:  Diagnosis Date   Allergic rhinitis    Arthritis    "back" (12/29/2014)   BPH (benign prostatic hyperplasia)    Class 3 severe obesity due to excess calories with serious comorbidity and body mass index (BMI) of 40.0 to 44.9 in adult [facility])    DDD (degenerative disc disease), lumbar    Diverticulosis    ED (erectile dysfunction)    Edema    Elevated coronary artery calcium  score    Elevated fasting glucose    Exertional dyspnea    Fatigue    High cholesterol    History of kidney stones    HNP (herniated nucleus pulposus), lumbar    l4-5   HTN (hypertension)    Hypertension    Kidney stone    Leg fracture, right 1979   Leg fracture, right 1967   Liver cyst    Low testosterone     OA (osteoarthritis) of knee    Primary localized osteoarthritis of left knee 12/29/2014   Renal calculus    Sleep apnea  CPAP   Snoring    Vitamin B12 deficiency     PSH:   Past Surgical History:  Procedure Laterality Date   ADENOIDECTOMY  ~ 1970   "not tonsils"   COLONOSCOPY     CYSTOSCOPY W/ STONE MANIPULATION  ~ 2002   CYSTOSCOPY W/ URETERAL STENT PLACEMENT Right 06/13/2023   Procedure: CYSTOSCOPY, WITH RETROGRADE PYELOGRAM AND URETERAL STENT INSERTION;  Surgeon: Homero Luster, MD;  Location: [facility] OR;  Service: Urology;  Laterality: Right;   LASIK Bilateral 2008   LITHOTRIPSY  X 1   NEPHROLITHOTOMY Left 12/30/2020   Procedure: LEFT NEPHROLITHOTOMY PERCUTANEOUS WTH  SURGEON ACCESS;  Surgeon: Roxane Copp, MD;  Location: [facility] ORS;  Service: Urology;  Laterality: Left;   TOTAL KNEE ARTHROPLASTY Left 12/29/2014   TOTAL KNEE ARTHROPLASTY Left 12/29/2014   Procedure: LEFT TOTAL KNEE ARTHROPLASTY;  Surgeon: Elly Habermann, MD;  Location: [facility] OR;  Service: Orthopedics;  Laterality: Left;    Allergies: No Known Allergies  Medications:   Prior to Admission medications    Medication Sig Start Date End Date Taking? Authorizing Provider  acetaminophen  (TYLENOL ) 500 MG tablet Take 1,000 mg by mouth every 6 (six) hours as needed.   Yes [provider]  aspirin  EC 81 MG tablet Take 81 mg by mouth daily. Swallow whole.   Yes [provider]  cetirizine (ZYRTEC) 10 MG tablet Take 10 mg by mouth daily.   Yes [provider]  dextromethorphan (DELSYM) 30 MG/5ML liquid Take 15 mg by mouth as needed for cough.   Yes [provider]  fluticasone  (FLONASE ) 50 MCG/ACT nasal spray Place 2 sprays into both nostrils daily.   Yes [provider]  guaiFENesin (MUCINEX) 600 MG 12 hr tablet Take 1,200 mg by mouth 2 (two) times daily as needed for cough or to loosen phlegm.   Yes [provider]  HYDROcodone -acetaminophen  (NORCO/VICODIN) 5-325 MG tablet Take 1 tablet by mouth every 6 hours as needed for 5 days 06/12/23  Yes   montelukast  (SINGULAIR ) 10 MG tablet Take 1 tablet (10 mg total) by mouth daily. 05/19/23  Yes   Multiple Vitamins-Minerals (MENS 50+ MULTI VITAMIN/MIN) TABS Take 1 tablet by mouth daily.   Yes [provider]  olmesartan  (BENICAR ) 40 MG tablet Take 1 tablet (40 mg total) by mouth daily. 12/02/22  Yes   ondansetron  (ZOFRAN -ODT) 4 MG disintegrating tablet Take 1 tablet (4 mg total) by mouth every 8 (eight) hours as needed for nausea or vomiting. 04/26/23  Yes Mayer, Jodi R, NP  PRESCRIPTION MEDICATION 1 each at bedtime. CPAP with nasal pillow   Yes [provider]  promethazine -dextromethorphan (PROMETHAZINE -DM) 6.25-15 MG/5ML syrup Take 5 mLs by mouth at bedtime as needed for cough. 05/25/23  Yes Mayer, Jodi R, NP  rosuvastatin  (CRESTOR ) 5 MG tablet Take 1 tablet (5 mg total) by mouth daily. 09/22/22  Yes Hugh Madura, MD  sildenafil  (VIAGRA ) 100 MG tablet Take 1 tablet (100 mg total) by mouth daily as needed. 05/19/23  Yes   tamsulosin  (FLOMAX ) 0.4 MG CAPS capsule Take 1 capsule (0.4 mg total) by  mouth daily. 06/12/23  Yes     Discontinued Meds:   Medications Discontinued During This Encounter  Medication Reason   sodium chloride  0.9 % bolus 1,000 mL    celecoxib  (CELEBREX ) 200 MG capsule Patient Preference   cyclobenzaprine  (FLEXERIL ) 10 MG tablet Patient Preference   hydrochlorothiazide  (HYDRODIURIL ) 25 MG tablet Discontinued by provider   HYDROcodone -acetaminophen  (NORCO/VICODIN) 5-325 MG tablet Duplicate  losartan  (COZAAR ) 100 MG tablet Discontinued by provider   metoprolol  tartrate (LOPRESSOR ) 100 MG tablet Discontinued by provider   olmesartan  (BENICAR ) 20 MG tablet Dose change   oxyCODONE  (OXY IR/ROXICODONE ) 5 MG immediate release tablet No longer needed (for PRN medications)   sildenafil  (VIAGRA ) 100 MG tablet Duplicate   sildenafil  (VIAGRA ) 100 MG tablet Duplicate   tadalafil  (CIALIS ) 5 MG tablet Duplicate   tamsulosin  (FLOMAX ) 0.4 MG CAPS capsule Discontinued by provider   testosterone  (ANDROGEL ) 50 MG/5GM (1%) GEL Patient Preference   0.9 %  sodium chloride  infusion    sodium chloride  irrigation 0.9 % Patient Discharge   water for irrigation, sterile for irrigation SOLN Patient Discharge   iohexol  (OMNIPAQUE ) 300 MG/ML solution Patient Discharge   0.9 %  sodium chloride  infusion Patient Transfer   fentaNYL  (SUBLIMAZE ) injection 25-50 mcg Patient Transfer   acetaminophen  (OFIRMEV ) IV 1,000 mg Patient Transfer   droperidol (INAPSINE) 2.5 MG/ML injection 0.625 mg Patient Transfer    Social History:  reports that he has never smoked. He has never used smokeless tobacco. He reports current alcohol use of about 4.0 standard drinks of alcohol per week. He reports that he does not use drugs.  Family History:   Family History  Problem Relation Age of Onset   Osteoarthritis Mother    Osteoarthritis Father    Hypertension Father    AAA (abdominal aortic aneurysm) Father    Diabetes Sister     Blood pressure 135/73, pulse (!) 58, temperature 98.4 F (36.9 C), resp.  rate 18, height 5\' 10"  (1.778 m), weight 136.1 kg, SpO2 100%. Physical Exam: General appearance: alert, cooperative, and appears stated age Head: Normocephalic, without obvious abnormality, atraumatic Eyes: no  conjunctival palor. PERRL, EOM's intact Neck: no adenopathy, no carotid bruit, supple, symmetrical, trachea midline, and thyroid  not enlarged, symmetric, no tenderness/mass/nodules Back: symmetric, no curvature. ROM normal. No CVA tenderness. Resp: clear to auscultation bilaterally Cardio: regular rate and rhythm GI: obese, soft Extremities: extremities normal, atraumatic, no cyanosis or edema Pulses: 2+ and symmetric Skin: Skin color, texture, turgor normal. No rashes or lesions     Iysha Mishkin, Alveda Aures, MD 06/14/2023, 8:10 AM

## 2023-06-14 NOTE — Progress Notes (Signed)
 Progress Note   Patient: Adam Avila MWU:[id number] DOB: 08-May-1960 DOA: 06/12/2023     2 DOS: the patient was seen and examined on 06/14/2023   Brief hospital course: Adam Avila is a 63 y.o. male with medical history significant of CAD, hypertension, hyperlipidemia, OSA on CPAP, CKD stage IIIa, class III obesity, history of kidney stones, BPH presenting to the ED for evaluation of right-sided abdominal/flank pain and abdominal bloating x 3 days.   He does report decreased urine output but has not noticed any blood in his urine.  He saw his PCP and had outpatient labs done which were concerning for acute kidney injury so he was sent to the ED to be evaluated.  He reports history of kidney stones on the left in the past.  He takes aspirin  81 mg daily but no other blood thinners.   ED Course: Blood pressure elevated with systolic in the 170s but remainder of vital signs stable on arrival. Labs notable for BUN 64, creatinine 9.0 (baseline 1.4), GFR 6, potassium 4.1, bicarb 23, UA pending.  CT showing an obstructive calculus at the right UPJ causing mild to moderate right-sided hydronephrosis. Patient was given Percocet and 500 mL normal saline.   EDP discussed the case with Dr. Inga Manges with urology who requested admission at [facility]  Assessment and Plan: Obstructing kidney stone causing mild to moderate right-sided hydronephrosis - Rt UPJ Stone  S/P Cystoscopy with right retrograde pyelogram and interpretation. Insertion of right double-J stent.  - CT showing an obstructive calculus at the right UPJ causing mild to moderate right-sided hydronephrosis.    - No signs of sepsis, continued on empiric antibiotics of ceftriaxone  - UA unremarkable,  -Renal function improved nicely overnight   -Continue Flomax .   -Hold home aspirin .  He is not on anticoagulation.    AKI on CKD stage IIIa  - In the setting of obstructing kidney stone with hydronephrosis. Baseline Cr 1.4 -  Per  pharmacy med rec, patient is no longer on hydrochlorothiazide  and losartan . -Cr improving nicely post-stent placement -voiding without foley -recheck bmet in AM   Mild bradycardia Heart rate currently in the 50s but patient is asymptomatic.  Cardiac monitoring, EKG,  - TSH elevated to 4.5, obtaining free T3, T4 levels   CAD- stable - Holding aspirin  in anticipation of urologic procedure.  Continue statin.   Hypertension - SBP in 140s.   - Hold olmesartan  due to AKI.  IV hydralazine  PRN SBP >160.    Hyperlipidemia -  Continue Crestor .   OSA - Continue nightly CPAP.   BPH -Continue Flomax .         Subjective: Eager to go home soon  Physical Exam: Vitals:   06/13/23 1700 06/13/23 2031 06/14/23 0516 06/14/23 0752  BP: 136/67 133/86 (!) 141/80 135/73  Pulse: 66 73 (!) 57 (!) 58  Resp:  18 18 18   Temp: 98 F (36.7 C) 97.8 F (36.6 C) (!) 97.5 F (36.4 C) 98.4 F (36.9 C)  TempSrc: Oral Oral Oral   SpO2: 96% 95% 95% 100%  Weight:      Height:       General exam: Awake, laying in bed, in nad Respiratory system: Normal respiratory effort, no wheezing Cardiovascular system: regular rate, s1, s2 Gastrointestinal system: Soft, nondistended, positive BS Central nervous system: CN2-12 grossly intact, strength intact Extremities: Perfused, no clubbing Skin: Normal skin turgor, no notable skin lesions seen Psychiatry: Mood normal // no visual hallucinations   Data  Reviewed:  Labs reviewed: Na 137, K 5.3, Cr 4.92  Family Communication: Pt in room, family at bedside  Disposition: Status is: Inpatient Remains inpatient appropriate because: severity of illness  Planned Discharge Destination: Home    Author: Cherylle Corwin, MD 06/14/2023 4:13 PM  For on call review [email or web address].

## 2023-06-14 NOTE — Progress Notes (Signed)
 1 Day Post-Op Subjective: Pt in good spirits this morning. Significant interval improvement in renal indices.  Case and plan was reviewed with patient.  All questions were answered to his satisfaction.  Objective: Vital signs in last 24 hours: Temp:  [97.5 F (36.4 C)-98.4 F (36.9 C)] 98.4 F (36.9 C) (04/30 0752) Pulse Rate:  [57-74] 58 (04/30 0752) Resp:  [13-18] 18 (04/30 0752) BP: (102-141)/(67-89) 135/73 (04/30 0752) SpO2:  [95 %-100 %] 100 % (04/30 0752) FiO2 (%):  [21 %] 21 % (04/30 0039)  Assessment/Plan: # Right UPJ stone # AKI  Baseline CKD with minimal left side kidney function.  Severe AKI with serum creatinine greater than 10 prior to surgery. To the OR with Dr. Elicia Ground for urgent right ureteral stent placement. Significant interval improvement in serum creatinine overnight.  Down to 4.9 today.  Patient would like to discharge home today which I believe is reasonable.  He understands there is some risk and discharging while awaiting return of renal function. He can follow-up closely with his PCP and collect BMP Thursday morning.  Will contact us  if there is no further improvement or interval worsening of his serum creatinine. Intake/Output from previous day: 04/29 0701 - 04/30 0700 In: 350 [I.V.:350] Out: 4500 [Urine:4500]  Intake/Output this shift: Total I/O In: 240 [P.O.:240] Out: 450 [Urine:450]  Physical Exam:  General: Alert and oriented CV: No cyanosis Lungs: equal chest rise Abdomen: Soft, NTND, no rebound or guarding Gu: Foley catheter draining clear yellow urine.  Discontinue today.  Lab Results: Recent Labs    06/12/23 1940 06/13/23 0445  HGB 14.3 14.4  HCT 41.9 41.5   BMET Recent Labs    06/13/23 0445 06/14/23 0446  NA 139 137  K 5.3* 5.3*  CL 105 111  CO2 20* 19*  GLUCOSE 105* 140*  BUN 65* 51*  CREATININE 10.06* 4.92*  CALCIUM  9.2 8.6*     Studies/Results: DG C-Arm 1-60 Min Result Date: 06/13/2023 CLINICAL DATA:   Right-side cysto with stent placement. EXAM: DG C-ARM 1-60 MIN COMPARISON:  Abdominopelvic CT 06/12/2023. FINDINGS: C-arm fluoroscopy was provided in the operating room without the presence of a radiologist.28 seconds fluoroscopy time. 20.57 mGy air kerma. Two C-arm fluoroscopic images were obtained intraoperatively and are submitted for post operative interpretation. On the submitted images, contrast is noted in the right ureter, and subsequent image demonstrates a probable right ureteral stent. Field of view and limited extent of the submitted images limit diagnostic information. Please see intraoperative findings for further detail. IMPRESSION: Intraoperative C-arm fluoroscopy as described. Electronically Signed   By: Elmon Hagedorn M.D.   On: 06/13/2023 16:13   CT Renal Stone Study Result Date: 06/12/2023 CLINICAL DATA:  Abdominal/flank pain, stone suspected EXAM: CT ABDOMEN AND PELVIS WITHOUT CONTRAST TECHNIQUE: Multidetector CT imaging of the abdomen and pelvis was performed following the standard protocol without IV contrast. RADIATION DOSE REDUCTION: This exam was performed according to the departmental dose-optimization program which includes automated exposure control, adjustment of the mA and/or kV according to patient size and/or use of iterative reconstruction technique. COMPARISON:  November 17, 2022, December 31, 2020 FINDINGS: Of note, the lack of intravenous contrast limits evaluation of the solid organ parenchyma and vascularity. Lower chest: No focal airspace consolidation or pleural effusion. Posterior bibasilar dependent atelectasis. Hepatobiliary: No mass. Decompressed gallbladder without radiopaque stones or wall thickening. No intrahepatic or extrahepatic biliary ductal dilation. Pancreas: No mass or main ductal dilation. No peripancreatic inflammation or fluid collection. Spleen: Normal size. No  mass. Adrenals/Urinary Tract: No adrenal masses. Atrophic left kidney. No renal mass. Punctate  nonobstructive calculi within the right kidney. Mild to moderate right-sided hydronephrosis due to an obstructive calculus at the right UPJ, measuring 3 x 3 x 4 mm. Partially distended urinary bladder without visualized abnormality. Stomach/Bowel:Small amount of fluid in the distal esophagus. The stomach contains ingested material without focal abnormality. No small bowel wall thickening or inflammation. No small bowel obstruction. Normal appendix. Descending and sigmoid colonic diverticulosis. No changes of acute diverticulitis. Vascular/Lymphatic: No aortic aneurysm. Scattered aortoiliac atherosclerosis. No intraabdominal or pelvic lymphadenopathy. Reproductive: No prostatomegaly. No free pelvic fluid. Other: No pneumoperitoneum, ascites, or mesenteric inflammation. Musculoskeletal: No acute fracture or destructive lesion. Multilevel degenerative disc disease of the spine. Moderate right hip osteoarthritis. Likely bone island in the left iliac bone. IMPRESSION: 1. Mild to moderate right-sided hydronephrosis due to an obstructive calculus at the right UPJ, measuring 3 x 3 x 4 mm. 2. Small amount of fluid in the distal esophagus, possibly due to incomplete emptying or gastroesophageal reflux disease. 3. Descending and sigmoid colonic diverticulosis. No changes of acute diverticulitis. Electronically Signed   By: Rance Burrows M.D.   On: 06/12/2023 20:18      LOS: 2 days   Alla Ar, NP [facility] Pager: [phone]  06/14/2023, 10:27 AM

## 2023-06-15 DIAGNOSIS — N179 Acute kidney failure, unspecified: Secondary | ICD-10-CM | POA: Diagnosis not present

## 2023-06-15 DIAGNOSIS — N189 Chronic kidney disease, unspecified: Secondary | ICD-10-CM | POA: Diagnosis not present

## 2023-06-15 DIAGNOSIS — N200 Calculus of kidney: Secondary | ICD-10-CM | POA: Diagnosis not present

## 2023-06-15 LAB — BASIC METABOLIC PANEL WITH GFR
Anion gap: 9 (ref 5–15)
BUN: 45 mg/dL — ABNORMAL HIGH (ref 8–23)
CO2: 23 mmol/L (ref 22–32)
Calcium: 9.2 mg/dL (ref 8.9–10.3)
Chloride: 110 mmol/L (ref 98–111)
Creatinine, Ser: 2.45 mg/dL — ABNORMAL HIGH (ref 0.61–1.24)
GFR, Estimated: 28 mL/min — ABNORMAL LOW (ref >60)
Glucose, Bld: 107 mg/dL — ABNORMAL HIGH (ref 70–99)
Potassium: 5.1 mmol/L (ref 3.5–5.1)
Sodium: 142 mmol/L (ref 135–145)

## 2023-06-15 NOTE — Discharge Summary (Signed)
 Physician Discharge Summary   Patient: Adam Avila MRN: [id number] DOB: December 21, 1960  Admit date:     06/12/2023  Discharge date: 06/15/23  Discharge Physician: Cherylle Corwin   PCP: Jimmey Mould, MD   Recommendations at discharge:    Follow up with PCP In 1-2 weeks Follow up with Urology as scheduled Follow up with Nephrology in 4 weeks  Discharge Diagnoses: Principal Problem:   Kidney stone Active Problems:   AKI (acute kidney injury) (HCC)   Hydronephrosis   Bradycardia   CAD (coronary artery disease)  Resolved Problems:   * No resolved hospital problems. *  Hospital Course: Adam Avila is a 63 y.o. male with medical history significant of CAD, hypertension, hyperlipidemia, OSA on CPAP, CKD stage IIIa, class III obesity, history of kidney stones, BPH presenting to the ED for evaluation of right-sided abdominal/flank pain and abdominal bloating x 3 days.   He does report decreased urine output but has not noticed any blood in his urine.  He saw his PCP and had outpatient labs done which were concerning for acute kidney injury so he was sent to the ED to be evaluated.  He reports history of kidney stones on the left in the past.  He takes aspirin  81 mg daily but no other blood thinners.   ED Course: Blood pressure elevated with systolic in the 170s but remainder of vital signs stable on arrival. Labs notable for BUN 64, creatinine 9.0 (baseline 1.4), GFR 6, potassium 4.1, bicarb 23, UA pending.  CT showing an obstructive calculus at the right UPJ causing mild to moderate right-sided hydronephrosis. Patient was given Percocet and 500 mL normal saline.   EDP discussed the case with Dr. Inga Manges with urology who requested admission at [facility]  Assessment and Plan: Obstructing kidney stone causing mild to moderate right-sided hydronephrosis - Rt UPJ Stone  S/P Cystoscopy with right retrograde pyelogram and interpretation. Insertion of right double-J stent.  - CT  showing an obstructive calculus at the right UPJ causing mild to moderate right-sided hydronephrosis.    - No signs of sepsis, continued on empiric antibiotics of ceftriaxone   - UA unremarkable,  -Renal function demonstrated steady improvement   -Continued Flomax .      AKI on CKD stage IIIa  - In the setting of obstructing kidney stone with hydronephrosis. Baseline Cr 1.4 -  Per pharmacy med rec, patient is no longer on hydrochlorothiazide  and losartan . -Cr improving nicely post-stent placement -foley was removed -Cr improving   Mild bradycardia Heart rate currently in the 50s but patient is asymptomatic.  Cardiac monitoring, EKG,  - TSH elevated to 4.5, obtaining free T3, T4 levels   CAD- stable - Holding aspirin  in anticipation of urologic procedure.  Continue statin.   Hypertension - SBP in 140s.   - Hold olmesartan  due to AKI.  IV hydralazine  PRN SBP >160.    Hyperlipidemia -  Continue Crestor .   OSA - Continue nightly CPAP.   BPH -Continue Flomax .    Consultants: Urology, Nephrology Procedures performed:   Disposition: Home Diet recommendation:  Regular diet DISCHARGE MEDICATION: Allergies as of 06/15/2023   No Known Allergies      Medication List     STOP taking these medications    sildenafil  100 MG tablet Commonly known as: VIAGRA        TAKE these medications    acetaminophen  500 MG tablet Commonly known as: TYLENOL  Take 1,000 mg by mouth every 6 (six) hours as needed.  aspirin  EC 81 MG tablet Take 81 mg by mouth daily. Swallow whole.   cetirizine 10 MG tablet Commonly known as: ZYRTEC Take 10 mg by mouth daily.   Delsym  30 MG/5ML liquid Generic drug: dextromethorphan  Take 15 mg by mouth as needed for cough.   fluticasone  50 MCG/ACT nasal spray Commonly known as: FLONASE  Place 2 sprays into both nostrils daily.   guaiFENesin 600 MG 12 hr tablet Commonly known as: MUCINEX Take 1,200 mg by mouth 2 (two) times daily as needed for cough or  to loosen phlegm.   HYDROcodone -acetaminophen  5-325 MG tablet Commonly known as: NORCO/VICODIN Take 1 tablet by mouth every 6 hours as needed for 5 days   Mens 50+ Multi Vitamin/Min Tabs Take 1 tablet by mouth daily.   montelukast  10 MG tablet Commonly known as: SINGULAIR  Take 1 tablet (10 mg total) by mouth daily.   olmesartan  40 MG tablet Commonly known as: BENICAR  Take 1 tablet (40 mg total) by mouth daily.   ondansetron  4 MG disintegrating tablet Commonly known as: ZOFRAN -ODT Take 1 tablet (4 mg total) by mouth every 8 (eight) hours as needed for nausea or vomiting.   PRESCRIPTION MEDICATION 1 each at bedtime. CPAP with nasal pillow   promethazine -dextromethorphan  6.25-15 MG/5ML syrup Commonly known as: PROMETHAZINE -DM Take 5 mLs by mouth at bedtime as needed for cough.   rosuvastatin  5 MG tablet Commonly known as: CRESTOR  Take 1 tablet (5 mg total) by mouth daily.   tamsulosin  0.4 MG Caps capsule Commonly known as: FLOMAX  Take 1 capsule (0.4 mg total) by mouth daily.        Follow-up Information     [facility] Follow up.   Why: The office will call to arrange your next procedure. Contact information: [address] [place] [address] [place]  [address] [phone]        Jimmey Mould, MD Follow up in 2 week(s).   Specialty: Family Medicine Why: Hospital follow up Contact information: [address] [place] [address] [phone]         Charletta Cons, MD Follow up in 4 week(s).   Specialty: Nephrology Why: Hospital follow up Contact information: [address] [place] [address] [phone]                Discharge Exam: [name] Weights   06/12/23 1832 06/15/23 0415  Weight: 136.1 kg 132.6 kg   General exam: Awake, laying in bed, in nad Respiratory system: Normal respiratory effort, no wheezing Cardiovascular system: regular rate, s1, s2 Gastrointestinal system: Soft, nondistended, positive  BS Central nervous system: CN2-12 grossly intact, strength intact Extremities: Perfused, no clubbing Skin: Normal skin turgor, no notable skin lesions seen Psychiatry: Mood normal // no visual hallucinations   Condition at discharge: fair  The results of significant diagnostics from this hospitalization (including imaging, microbiology, ancillary and laboratory) are listed below for reference.   Imaging Studies: DG C-Arm 1-60 Min Result Date: 06/13/2023 CLINICAL DATA:  Right-side cysto with stent placement. EXAM: DG C-ARM 1-60 MIN COMPARISON:  Abdominopelvic CT 06/12/2023. FINDINGS: C-arm fluoroscopy was provided in the operating room without the presence of a radiologist.28 seconds fluoroscopy time. 20.57 mGy air kerma. Two C-arm fluoroscopic images were obtained intraoperatively and are submitted for post operative interpretation. On the submitted images, contrast is noted in the right ureter, and subsequent image demonstrates a probable right ureteral stent. Field of view and limited extent of the submitted images limit diagnostic information. Please see intraoperative findings for  further detail. IMPRESSION: Intraoperative C-arm fluoroscopy as described. Electronically Signed   By: Elmon Hagedorn M.D.   On: 06/13/2023 16:13   CT Renal Stone Study Result Date: 06/12/2023 CLINICAL DATA:  Abdominal/flank pain, stone suspected EXAM: CT ABDOMEN AND PELVIS WITHOUT CONTRAST TECHNIQUE: Multidetector CT imaging of the abdomen and pelvis was performed following the standard protocol without IV contrast. RADIATION DOSE REDUCTION: This exam was performed according to the departmental dose-optimization program which includes automated exposure control, adjustment of the mA and/or kV according to patient size and/or use of iterative reconstruction technique. COMPARISON:  November 17, 2022, December 31, 2020 FINDINGS: Of note, the lack of intravenous contrast limits evaluation of the solid organ parenchyma and  vascularity. Lower chest: No focal airspace consolidation or pleural effusion. Posterior bibasilar dependent atelectasis. Hepatobiliary: No mass. Decompressed gallbladder without radiopaque stones or wall thickening. No intrahepatic or extrahepatic biliary ductal dilation. Pancreas: No mass or main ductal dilation. No peripancreatic inflammation or fluid collection. Spleen: Normal size. No mass. Adrenals/Urinary Tract: No adrenal masses. Atrophic left kidney. No renal mass. Punctate nonobstructive calculi within the right kidney. Mild to moderate right-sided hydronephrosis due to an obstructive calculus at the right UPJ, measuring 3 x 3 x 4 mm. Partially distended urinary bladder without visualized abnormality. Stomach/Bowel:Small amount of fluid in the distal esophagus. The stomach contains ingested material without focal abnormality. No small bowel wall thickening or inflammation. No small bowel obstruction. Normal appendix. Descending and sigmoid colonic diverticulosis. No changes of acute diverticulitis. Vascular/Lymphatic: No aortic aneurysm. Scattered aortoiliac atherosclerosis. No intraabdominal or pelvic lymphadenopathy. Reproductive: No prostatomegaly. No free pelvic fluid. Other: No pneumoperitoneum, ascites, or mesenteric inflammation. Musculoskeletal: No acute fracture or destructive lesion. Multilevel degenerative disc disease of the spine. Moderate right hip osteoarthritis. Likely bone island in the left iliac bone. IMPRESSION: 1. Mild to moderate right-sided hydronephrosis due to an obstructive calculus at the right UPJ, measuring 3 x 3 x 4 mm. 2. Small amount of fluid in the distal esophagus, possibly due to incomplete emptying or gastroesophageal reflux disease. 3. Descending and sigmoid colonic diverticulosis. No changes of acute diverticulitis. Electronically Signed   By: Rance Burrows M.D.   On: 06/12/2023 20:18   DG Chest 2 View Result Date: 05/25/2023 CLINICAL DATA:  Cough.  Shortness of  breath. EXAM: CHEST - 2 VIEW COMPARISON:  None Available. FINDINGS: Low lung volume. Bilateral lung fields are clear. Bilateral costophrenic angles are clear. Normal cardio-mediastinal silhouette. No acute osseous abnormalities. The soft tissues are within normal limits. IMPRESSION: *No active cardiopulmonary disease. Electronically Signed   By: Beula Brunswick M.D.   On: 05/25/2023 16:40    Microbiology: Results for orders placed or performed during the hospital encounter of 06/12/23  Surgical pcr screen     Status: Abnormal   Collection Time: 06/13/23 12:32 AM   Specimen: Nasal Mucosa; Nasal Swab  Result Value Ref Range Status   MRSA, PCR NEGATIVE NEGATIVE Final   Staphylococcus aureus POSITIVE (A) NEGATIVE Final    Comment: (NOTE) The Xpert SA Assay (FDA approved for NASAL specimens in patients 9 years of age and older), is one component of a comprehensive surveillance program. It is not intended to diagnose infection nor to guide or monitor treatment. Performed at [facility], [address] [place], [place] [address]     Labs: CBC: Recent Labs  Lab 06/12/23 1940 06/13/23 0445  WBC 9.1 9.4  NEUTROABS 5.9  --   HGB 14.3 14.4  HCT 41.9 41.5  MCV  92.3 91.4  PLT 239 221   Basic Metabolic Panel: Recent Labs  Lab 06/12/23 1940 06/13/23 0445 06/14/23 0446 06/15/23 0524  NA 139 139 137 142  K 4.1 5.3* 5.3* 5.1  CL 102 105 111 110  CO2 23 20* 19* 23  GLUCOSE 93 105* 140* 107*  BUN 64* 65* 51* 45*  CREATININE 9.00* 10.06* 4.92* 2.45*  CALCIUM  9.8 9.2 8.6* 9.2   Liver Function Tests: Recent Labs  Lab 06/12/23 1940  AST 19  ALT 20  ALKPHOS 57  BILITOT 0.6  PROT 7.2  ALBUMIN 3.8   CBG: No results for input(s): "GLUCAP" in the last 168 hours.  Discharge time spent: less than 30 minutes.  Signed: Cherylle Corwin, MD [facility] 06/15/2023

## 2023-06-15 NOTE — Plan of Care (Signed)
   Problem: Health Behavior/Discharge Planning: Goal: Ability to manage health-related needs will improve Outcome: Completed/Met

## 2023-06-15 NOTE — Progress Notes (Signed)
 Patient is being discharged home. Discharge instructions reviewed with patient including follow up appointments and medications. Patient verbalized a full understanding of teaching. RD provided patient with information on food to take home. Patient wife at bedside and will be transporting patient home.

## 2023-06-15 NOTE — TOC Transition Note (Signed)
 Transition of Care [facility]) - Discharge Note   Patient Details  Name: EMRYK CAULDER MRN: [id number] Date of Birth: 1960/06/02  [facility] of [facility]) CM/SW Contact:  Tom-Johnson, Angelique Ken, RN Phone Number: 06/15/2023, 3:02 PM   Clinical Narrative:     Patient is scheduled for discharge today.  Readmission Risk Assessment done. Outpatient f/u, hospital f/u and discharge instructions on AVS. No TOC needs or recommendations noted. Wife, Hettie Lota to transport at discharge.  No further TOC needs noted.        Final next level of care: Home/Self Care Barriers to Discharge: Barriers Resolved   Patient Goals and CMS Choice Patient states their goals for this hospitalization and ongoing recovery are:: To return home CMS Medicare.gov Compare Post Acute Care list provided to:: Patient Choice offered to / list presented to : NA      Discharge Placement                Patient to be transferred to facility by: Wife Name of family member notified: Phyllis    Discharge Plan and Services Additional resources added to the After Visit Summary for                  DME Arranged: N/A DME Agency: NA       HH Arranged: NA HH Agency: NA        Social Drivers of Health (SDOH) Interventions SDOH Screenings   Food Insecurity: No Food Insecurity (06/13/2023)  Housing: Low Risk  (06/13/2023)  Transportation Needs: No Transportation Needs (06/13/2023)  Utilities: Not At Risk (06/13/2023)  Tobacco Use: Low Risk  (06/13/2023)     Readmission Risk Interventions    06/15/2023    2:00 PM  Readmission Risk Prevention Plan  Post Dischage Appt Complete  Medication Screening Complete  Transportation Screening Complete

## 2023-06-15 NOTE — Progress Notes (Signed)
 Nutrition Education Note  RD consulted for kidney stone education. Provided Kidney Stone Nutrition Therapy handout. Reviewed food groups and provided written recommended serving sizes specifically determined for patient's current nutritional status.   Explained why diet restrictions are needed and provided lists of foods to limit/avoid that are high oxalate. Provided specific recommendations on safer alternatives of these foods. Strongly encouraged compliance of this diet.   Discussed importance of not over consuming protein intake and prioritizing hydration. Provided examples of how to maximize protein intake throughout the day.  Encouraged pt to discuss specific diet questions/concerns nephrologist and seek outpatient management, if indicated.  Expect good compliance.  Body mass index is 41.96 kg/m. Pt meets criteria for morbid obesity, based on current BMI.  Current diet order is renal diet, fluid restriction, patient is consuming approximately 100% of meals at this time. Labs and medications reviewed. No further nutrition interventions warranted at this time. RD contact information provided. If additional nutrition issues arise, please re-consult RD.  Con Decant MS, RD, LDN Registered Dietitian Clinical Nutrition RD Inpatient Contact Info in Amion

## 2023-06-15 NOTE — Progress Notes (Signed)
   2 Days Post-Op Subjective: Pt in good spirits this morning. Significant interval improvement in renal indices.  Case and plan was reviewed with patient.  All questions were answered to his satisfaction.  Objective: Vital signs in last 24 hours: Temp:  [97.6 F (36.4 C)-98.3 F (36.8 C)] 97.6 F (36.4 C) (05/01 0415) Pulse Rate:  [52-65] 63 (05/01 0809) Resp:  [18-19] 18 (05/01 0809) BP: (122-146)/(71-90) 122/71 (05/01 0809) SpO2:  [95 %-100 %] 97 % (05/01 0809) FiO2 (%):  [21 %] 21 % (04/30 2154) Weight:  [132.6 kg] 132.6 kg (05/01 0415)  Assessment/Plan: # Right UPJ stone # AKI  Baseline CKD with minimal left side kidney function.  Severe AKI with serum creatinine greater than 10 prior to surgery. To the OR with Dr. Elicia Ground for urgent right ureteral stent placement. Rob elected to stay in hospital folllowing our discussion. Thankfully his SCr has halved again today. He is staying well hydrated and has good UOP. He is safe to discharge once medically clear. He will follow up for repeat BMP with his PCP at the beginning of the week.   Intake/Output from previous day: 04/30 0701 - 05/01 0700 In: 1460 [P.O.:1260; IV Piggyback:200] Out: 2685 [Urine:2685]  Intake/Output this shift: Total I/O In: -  Out: 900 [Urine:900]  Physical Exam:  General: Alert and oriented CV: No cyanosis Lungs: equal chest rise Abdomen: Soft, NTND, no rebound or guarding Gu: Foley removed, voiding well  Lab Results: Recent Labs    06/12/23 1940 06/13/23 0445  HGB 14.3 14.4  HCT 41.9 41.5   BMET Recent Labs    06/14/23 0446 06/15/23 0524  NA 137 142  K 5.3* 5.1  CL 111 110  CO2 19* 23  GLUCOSE 140* 107*  BUN 51* 45*  CREATININE 4.92* 2.45*  CALCIUM  8.6* 9.2     Studies/Results: DG C-Arm 1-60 Min Result Date: 06/13/2023 CLINICAL DATA:  Right-side cysto with stent placement. EXAM: DG C-ARM 1-60 MIN COMPARISON:  Abdominopelvic CT 06/12/2023. FINDINGS: C-arm fluoroscopy was  provided in the operating room without the presence of a radiologist.28 seconds fluoroscopy time. 20.57 mGy air kerma. Two C-arm fluoroscopic images were obtained intraoperatively and are submitted for post operative interpretation. On the submitted images, contrast is noted in the right ureter, and subsequent image demonstrates a probable right ureteral stent. Field of view and limited extent of the submitted images limit diagnostic information. Please see intraoperative findings for further detail. IMPRESSION: Intraoperative C-arm fluoroscopy as described. Electronically Signed   By: Elmon Hagedorn M.D.   On: 06/13/2023 16:13      LOS: 3 days   Alla Ar, NP [facility] Pager: [phone]  06/15/2023, 8:40 AM

## 2023-06-19 DIAGNOSIS — R899 Unspecified abnormal finding in specimens from other organs, systems and tissues: Secondary | ICD-10-CM | POA: Diagnosis not present

## 2023-06-21 DIAGNOSIS — N179 Acute kidney failure, unspecified: Secondary | ICD-10-CM | POA: Diagnosis not present

## 2023-06-21 DIAGNOSIS — N200 Calculus of kidney: Secondary | ICD-10-CM | POA: Diagnosis not present

## 2023-06-21 DIAGNOSIS — Z09 Encounter for follow-up examination after completed treatment for conditions other than malignant neoplasm: Secondary | ICD-10-CM | POA: Diagnosis not present

## 2023-06-21 DIAGNOSIS — Z6841 Body Mass Index (BMI) 40.0 and over, adult: Secondary | ICD-10-CM | POA: Diagnosis not present

## 2023-06-22 ENCOUNTER — Telehealth: Payer: Self-pay | Admitting: *Deleted

## 2023-06-22 ENCOUNTER — Telehealth: Payer: Self-pay | Admitting: Cardiology

## 2023-06-22 ENCOUNTER — Other Ambulatory Visit: Payer: Self-pay | Admitting: Urology

## 2023-06-22 NOTE — Telephone Encounter (Signed)
 Pt has been scheduled tele preop appt 06/28/23. Med rec and consent are done.

## 2023-06-22 NOTE — Telephone Encounter (Signed)
 Pt has been scheduled tele preop appt 06/28/23. Med rec and consent are done.      Patient Consent for Virtual Visit        Adam Avila has provided verbal consent on 06/22/2023 for a virtual visit (video or telephone).   CONSENT FOR VIRTUAL VISIT FOR:  Adam Avila  By participating in this virtual visit I agree to the following:  I hereby voluntarily request, consent and authorize [facility] and its employed or contracted physicians, physician assistants, nurse practitioners or other licensed health care professionals (the Practitioner), to provide me with telemedicine health care services (the "Services") as deemed necessary by the treating Practitioner. I acknowledge and consent to receive the Services by the Practitioner via telemedicine. I understand that the telemedicine visit will involve communicating with the Practitioner through live audiovisual communication technology and the disclosure of certain medical information by electronic transmission. I acknowledge that I have been given the opportunity to request an in-person assessment or other available alternative prior to the telemedicine visit and am voluntarily participating in the telemedicine visit.  I understand that I have the right to withhold or withdraw my consent to the use of telemedicine in the course of my care at any time, without affecting my right to future care or treatment, and that the Practitioner or I may terminate the telemedicine visit at any time. I understand that I have the right to inspect all information obtained and/or recorded in the course of the telemedicine visit and may receive copies of available information for a reasonable fee.  I understand that some of the potential risks of receiving the Services via telemedicine include:  Delay or interruption in medical evaluation due to technological equipment failure or disruption; Information transmitted may not be sufficient (e.g. poor  resolution of images) to allow for appropriate medical decision making by the Practitioner; and/or  In rare instances, security protocols could fail, causing a breach of personal health information.  Furthermore, I acknowledge that it is my responsibility to provide information about my medical history, conditions and care that is complete and accurate to the best of my ability. I acknowledge that Practitioner's advice, recommendations, and/or decision may be based on factors not within their control, such as incomplete or inaccurate data provided by me or distortions of diagnostic images or specimens that may result from electronic transmissions. I understand that the practice of medicine is not an exact science and that Practitioner makes no warranties or guarantees regarding treatment outcomes. I acknowledge that a copy of this consent can be made available to me via my patient portal [facility] MyChart), or I can request a printed copy by calling the office of [facility].    I understand that my insurance will be billed for this visit.   I have read or had this consent read to me. I understand the contents of this consent, which adequately explains the benefits and risks of the Services being provided via telemedicine.  I have been provided ample opportunity to ask questions regarding this consent and the Services and have had my questions answered to my satisfaction. I give my informed consent for the services to be provided through the use of telemedicine in my medical care

## 2023-06-22 NOTE — Telephone Encounter (Signed)
   Pre-operative Risk Assessment    Patient Name: Adam Avila  DOB: 11/24/1960 MRN: [id number]   Date of last office visit: 03/06/23 Date of next office visit: due next Jan   Request for Surgical Clearance    Procedure:  Cystoscopy/ right Ureteroscopy/ Laser Lithotripsy/stent exchange   Date of Surgery:  Clearance 07/11/23                                Surgeon:  Dr. Glendia Lands Group or Practice Name:  [facility]  Phone number:  [phone]  Fax number:  [phone]    Type of Clearance Requested:   - Medical  - Pharmacy:  Hold Aspirin  5 days prior    Type of Anesthesia:  General    Additional requests/questions:    Virgie Griffith   06/22/2023, 2:27 PM

## 2023-06-22 NOTE — Telephone Encounter (Signed)
   Name: Adam Avila  DOB: 03/22/1960  MRN: [id number]  Primary Cardiologist: Dorothye Gathers, MD   Preoperative team, please contact this patient and set up a phone call appointment for further preoperative risk assessment. Please obtain consent and complete medication review. Thank you for your help.  I confirm that guidance regarding antiplatelet and oral anticoagulation therapy has been completed and, if necessary, noted below.  Ideally aspirin  should be continued without interruption, however if the bleeding risk is too great, aspirin  may be held for 5-7 days prior to surgery. Please resume aspirin  post operatively when it is felt to be safe from a bleeding standpoint.    I also confirmed the patient resides in the state of [place] . As per [facility] Board telemedicine laws, the patient must reside in the state in which the provider is licensed.   Morey Ar, NP 06/22/2023, 3:44 PM [facility]

## 2023-06-28 ENCOUNTER — Ambulatory Visit: Attending: Cardiology | Admitting: Emergency Medicine

## 2023-06-28 DIAGNOSIS — Z01810 Encounter for preprocedural cardiovascular examination: Secondary | ICD-10-CM | POA: Diagnosis not present

## 2023-06-28 NOTE — Progress Notes (Signed)
 Virtual Visit via Telephone Note   Because of Adam Avila co-morbid illnesses, he is at least at moderate risk for complications without adequate follow up.  This format is felt to be most appropriate for this patient at this time.  Due to technical limitations with video connection Web designer), today's appointment will be conducted as an audio only telehealth visit, and Adam Avila verbally agreed to proceed in this manner.   All issues noted in this document were discussed and addressed.  No physical exam could be performed with this format.  Evaluation Performed:  Preoperative cardiovascular risk assessment _____________   Date:  06/28/2023   Patient ID:  Adam Avila, DOB Jul 19, 1960, MRN [id number] Patient Location:  Home Provider location:   Office  Primary Care Provider:  Jimmey Mould, MD Primary Cardiologist:  Dorothye Gathers, MD  Chief Complaint / Patient Profile   63 y.o. y/o male with a h/o coronary artery disease, hypertension, obstructive sleep apnea manage on CPAP, obesity, chronic kidney disease who is pending cystoscopy, right ureteroscopy, laser lithotripsy, stent exchange on 07/11/2023 with [facility] by Dr. Valeta Gaudier and presents today for telephonic preoperative cardiovascular risk assessment.  History of Present Illness    Adam Avila is a 63 y.o. male who presents via audio/video conferencing for a telehealth visit today.  Pt was last seen in cardiology clinic on 03/06/2023 by Dr. Renna Cary.  At that time Adam Avila was doing well.  The patient is now pending procedure as outlined above. Since his last visit, he denies chest pain, shortness of breath, lower extremity edema, fatigue, palpitations, melena, hematuria, hemoptysis, diaphoresis, weakness, presyncope, syncope, orthopnea, and PND.  Today patient is doing well overall.  He denies any acute cardiovascular concerns or complaints at this time.  He does stay relatively active without any exertional  symptoms.  He is able to complete greater than 4 METS.  Past Medical History    Past Medical History:  Diagnosis Date   Allergic rhinitis    Arthritis    "back" (12/29/2014)   BPH (benign prostatic hyperplasia)    Class 3 severe obesity due to excess calories with serious comorbidity and body mass index (BMI) of 40.0 to 44.9 in adult    DDD (degenerative disc disease), lumbar    Diverticulosis    ED (erectile dysfunction)    Edema    Elevated coronary artery calcium  score    Elevated fasting glucose    Exertional dyspnea    Fatigue    High cholesterol    History of kidney stones    HNP (herniated nucleus pulposus), lumbar    l4-5   HTN (hypertension)    Hypertension    Kidney stone    Leg fracture, right 1979   Leg fracture, right 1967   Liver cyst    Low testosterone     OA (osteoarthritis) of knee    Primary localized osteoarthritis of left knee 12/29/2014   Renal calculus    Sleep apnea    CPAP   Snoring    Vitamin B12 deficiency    Past Surgical History:  Procedure Laterality Date   ADENOIDECTOMY  ~ 1970   "not tonsils"   COLONOSCOPY     CYSTOSCOPY W/ STONE MANIPULATION  ~ 2002   CYSTOSCOPY W/ URETERAL STENT PLACEMENT Right 06/13/2023   Procedure: CYSTOSCOPY, WITH RETROGRADE PYELOGRAM AND URETERAL STENT INSERTION;  Surgeon: Homero Luster, MD;  Location: [facility] OR;  Service: Urology;  Laterality: Right;   LASIK Bilateral  2008   LITHOTRIPSY  X 1   NEPHROLITHOTOMY Left 12/30/2020   Procedure: LEFT NEPHROLITHOTOMY PERCUTANEOUS WTH  SURGEON ACCESS;  Surgeon: Roxane Copp, MD;  Location: [facility] ORS;  Service: Urology;  Laterality: Left;   TOTAL KNEE ARTHROPLASTY Left 12/29/2014   TOTAL KNEE ARTHROPLASTY Left 12/29/2014   Procedure: LEFT TOTAL KNEE ARTHROPLASTY;  Surgeon: Elly Habermann, MD;  Location: [facility] OR;  Service: Orthopedics;  Laterality: Left;    Allergies  No Known Allergies  Home Medications    Prior to Admission medications   Medication Sig Start Date End  Date Taking? Authorizing Provider  acetaminophen  (TYLENOL ) 500 MG tablet Take 1,000 mg by mouth every 6 (six) hours as needed.    [provider]  aspirin  EC 81 MG tablet Take 81 mg by mouth daily. Swallow whole.    [provider]  cetirizine (ZYRTEC) 10 MG tablet Take 10 mg by mouth daily.    [provider]  dextromethorphan  (DELSYM ) 30 MG/5ML liquid Take 15 mg by mouth as needed for cough.    [provider]  fluticasone  (FLONASE ) 50 MCG/ACT nasal spray Place 2 sprays into both nostrils daily.    [provider]  guaiFENesin (MUCINEX) 600 MG 12 hr tablet Take 1,200 mg by mouth 2 (two) times daily as needed for cough or to loosen phlegm.    [provider]  HYDROcodone -acetaminophen  (NORCO/VICODIN) 5-325 MG tablet Take 1 tablet by mouth every 6 hours as needed for 5 days 06/12/23     montelukast  (SINGULAIR ) 10 MG tablet Take 1 tablet (10 mg total) by mouth daily. 05/19/23     Multiple Vitamins-Minerals (MENS 50+ MULTI VITAMIN/MIN) TABS Take 1 tablet by mouth daily.    [provider]  olmesartan  (BENICAR ) 40 MG tablet Take 1 tablet (40 mg total) by mouth daily. 12/02/22     ondansetron  (ZOFRAN -ODT) 4 MG disintegrating tablet Take 1 tablet (4 mg total) by mouth every 8 (eight) hours as needed for nausea or vomiting. 04/26/23   Mayer, Jodi R, NP  PRESCRIPTION MEDICATION 1 each at bedtime. CPAP with nasal pillow    [provider]  promethazine -dextromethorphan  (PROMETHAZINE -DM) 6.25-15 MG/5ML syrup Take 5 mLs by mouth at bedtime as needed for cough. 05/25/23   Mayer, Jodi R, NP  rosuvastatin  (CRESTOR ) 5 MG tablet Take 1 tablet (5 mg total) by mouth daily. 09/22/22   Hugh Madura, MD  tamsulosin  (FLOMAX ) 0.4 MG CAPS capsule Take 1 capsule (0.4 mg total) by mouth daily. 06/12/23       Physical Exam    Vital Signs:  Adam Avila does not have vital signs available for review today.  Given telephonic nature of communication,  physical exam is limited. AAOx3. NAD. Normal affect.  Speech and respirations are unlabored.  Accessory Clinical Findings    None  Assessment & Plan    1.  Preoperative Cardiovascular Risk Assessment: According to the Revised Cardiac Risk Index (RCRI), his Perioperative Risk of Major Cardiac Event is (%): 0.9. His Functional Capacity in METs is: 7.01 according to the [facility] Activity Status Index (DASI). Therefore, based on ACC/AHA guidelines, patient would be at acceptable risk for the planned procedure without further cardiovascular testing.   The patient was advised that if he develops new symptoms prior to surgery to contact our office to arrange for a follow-up visit, and he verbalized understanding.  Ideally aspirin  should be continued without interruption, however if the bleeding risk is too great, aspirin  may be held for  5-7 days prior to surgery. Please resume aspirin  post operatively when it is felt to be safe from a bleeding standpoint.   A copy of this note will be routed to requesting surgeon.  Time:   Today, I have spent 10 minutes with the patient with telehealth technology discussing medical history, symptoms, and management plan.     Ava Boatman, NP  06/28/2023, 1:35 PM

## 2023-06-29 ENCOUNTER — Encounter (HOSPITAL_COMMUNITY): Payer: Self-pay

## 2023-06-29 ENCOUNTER — Other Ambulatory Visit (HOSPITAL_COMMUNITY): Payer: Self-pay

## 2023-06-30 ENCOUNTER — Other Ambulatory Visit (HOSPITAL_COMMUNITY): Payer: Self-pay

## 2023-06-30 MED ORDER — OLMESARTAN MEDOXOMIL 40 MG PO TABS
40.0000 mg | ORAL_TABLET | Freq: Every day | ORAL | 6 refills | Status: DC
  Filled 2023-06-30: qty 30, 30d supply, fill #0
  Filled 2023-07-27: qty 30, 30d supply, fill #1
  Filled 2023-08-29: qty 30, 30d supply, fill #2
  Filled 2023-09-21 – 2023-09-22 (×2): qty 30, 30d supply, fill #3
  Filled 2023-10-30: qty 30, 30d supply, fill #4
  Filled 2023-11-28: qty 30, 30d supply, fill #5
  Filled 2023-12-31: qty 30, 30d supply, fill #6

## 2023-07-03 NOTE — Patient Instructions (Addendum)
 SURGICAL WAITING ROOM VISITATION  Patients having surgery or a procedure may have no more than 2 support people in the waiting area - these visitors may rotate.    Children under the age of 66 must have an adult with them who is not the patient.  Due to an increase in RSV and influenza rates and associated hospitalizations, children ages 89 and under may not visit patients in [facility] hospitals.  Visitors with respiratory illnesses are discouraged from visiting and should remain at home.  If the patient needs to stay at the hospital during part of their recovery, the visitor guidelines for inpatient rooms apply. Pre-op nurse will coordinate an appropriate time for 1 support person to accompany patient in pre-op.  This support person may not rotate.    Please refer to the [facility] website for the visitor guidelines for Inpatients (after your surgery is over and you are in a regular room).       Your procedure is scheduled on: 07/11/23   Report to [facility] Main Entrance    Report to admitting at 12 noon   Call this number if you have problems the morning of surgery [phone]   Do not eat food or drink liquids:After Midnight.but may have sips of water  with meds.        Oral Hygiene is also important to reduce your risk of infection.                                    Remember - BRUSH YOUR TEETH THE MORNING OF SURGERY WITH YOUR REGULAR TOOTHPASTE   Stop all vitamins and herbal supplements 7 days before surgery.   Take these medicines the morning of surgery with A SIP OF WATER : tylenol  if needed, zyrtec, montelukast (singulair ), rosuvastatin , tamsulosin , hydrocodone  if needed.  Bring CPAP mask and tubing day of surgery.                              You may not have any metal on your body including hair pins, jewelry, and body piercing             Do not wear  lotions, powders,cologne, or deodorant              Men may shave face and neck.   Do not bring  valuables to the hospital. [facility] IS NOT             RESPONSIBLE   FOR VALUABLES.   Contacts, glasses, dentures or bridgework may not be worn into surgery.  DO NOT BRING YOUR HOME MEDICATIONS TO THE HOSPITAL. PHARMACY WILL DISPENSE MEDICATIONS LISTED ON YOUR MEDICATION LIST TO YOU DURING YOUR ADMISSION IN THE HOSPITAL!    Patients discharged on the day of surgery will not be allowed to drive home.  Someone NEEDS to stay with you for the first 24 hours after anesthesia.   Special Instructions: Bring a copy of your healthcare power of attorney and living will documents the day of surgery if you haven't scanned them before.              Please read over the following fact sheets you were given: IF YOU HAVE QUESTIONS ABOUT YOUR PRE-OP INSTRUCTIONS PLEASE CALL [phone] Ammon Bales   If you received a COVID test during your pre-op visit  it is requested that you wear a  mask when out in public, stay away from anyone that may not be feeling well and notify your surgeon if you develop symptoms. If you test positive for Covid or have been in contact with anyone that has tested positive in the last 10 days please notify you surgeon.    [place] - Preparing for Surgery Before surgery, you can play an important role.  Because skin is not sterile, your skin needs to be as free of germs as possible.  You can reduce the number of germs on your skin by washing with CHG (chlorahexidine gluconate) soap before surgery.  CHG is an antiseptic cleaner which kills germs and bonds with the skin to continue killing germs even after washing. Please DO NOT use if you have an allergy to CHG or antibacterial soaps.  If your skin becomes reddened/irritated stop using the CHG and inform your nurse when you arrive at Short Stay. Do not shave (including legs and underarms) for at least 48 hours prior to the first CHG shower.  You may shave your face/neck.  Please follow these instructions carefully:  1.  Shower with CHG  Soap the night before surgery and the  morning of surgery.  2.  If you choose to wash your hair, wash your hair first as usual with your normal  shampoo.  3.  After you shampoo, rinse your hair and body thoroughly to remove the shampoo.                             4.  Use CHG as you would any other liquid soap.  You can apply chg directly to the skin and wash.  Gently with a scrungie or clean washcloth.  5.  Apply the CHG Soap to your body ONLY FROM THE NECK DOWN.   Do   not use on face/ open                           Wound or open sores. Avoid contact with eyes, ears mouth and   genitals (private parts).                       Wash face,  Genitals (private parts) with your normal soap.             6.  Wash thoroughly, paying special attention to the area where your    surgery  will be performed.  7.  Thoroughly rinse your body with warm water  from the neck down.  8.  DO NOT shower/wash with your normal soap after using and rinsing off the CHG Soap.                9.  Pat yourself dry with a clean towel.            10.  Wear clean pajamas.            11.  Place clean sheets on your bed the night of your first shower and do not  sleep with pets. Day of Surgery : Do not apply any lotions/deodorants the morning of surgery.  Please wear clean clothes to the hospital/surgery center.  FAILURE TO FOLLOW THESE INSTRUCTIONS MAY RESULT IN THE CANCELLATION OF YOUR SURGERY  PATIENT SIGNATURE_________________________________  NURSE SIGNATURE__________________________________  ________________________________________________________________________

## 2023-07-03 NOTE — Progress Notes (Addendum)
 COVID Vaccine received:  []  No [x]  Yes Date of any COVID positive Test in last 90 days: no PCP - Roxanna Coppersmith MD Cardiologist - Dorothye Gathers MD  Chest x-ray -  EKG -   Stress Test -  ECHO - 08/12/22 Epic Cardiac Cath -   Cardiac clearance-Madison Fountain NP - 06/28/23  Bowel Prep - [x]  No  []   Yes ______  Pacemaker / ICD device [x]  No []  Yes   Spinal Cord Stimulator:[x]  No []  Yes       History of Sleep Apnea? []  No [x]  Yes   CPAP used?- []  No [x]  Yes    Does the patient monitor blood sugar?          [x]  No []  Yes  []  N/A  Patient has: [x]  NO Hx DM   []  Pre-DM                 []  DM1  []   DM2 Does patient have a [employer] or [employer]? []  No []  Yes   Fasting Blood Sugar Ranges-  Checks Blood Sugar _____ times a day  GLP1 agonist / usual dose - no GLP1 instructions:  SGLT-2 inhibitors / usual dose - no SGLT-2 instructions:   Blood Thinner / Instructions:no Aspirin  Instructions:81 mg ASA. Will stop 5-7 days PTS Comments:   Activity level: Patient is able  to climb a flight of stairs without difficulty; [x]  No CP  [x]  No SOB,   Patient can  perform ADLs without assistance.   Anesthesia review: HTN, OSA, Elevated coronary calcium  score.  Patient denies shortness of breath, fever, cough and chest pain at PAT appointment.  Patient verbalized understanding and agreement to the Pre-Surgical Instructions that were given to them at this PAT appointment. Patient was also educated of the need to review these PAT instructions again prior to his/her surgery.I reviewed the appropriate phone numbers to call if they have any and questions or concerns.

## 2023-07-04 ENCOUNTER — Encounter (HOSPITAL_COMMUNITY)
Admission: RE | Admit: 2023-07-04 | Discharge: 2023-07-04 | Disposition: A | Discharge status: Home / Self Care | Source: Ambulatory Visit | Attending: Urology | Admitting: Urology

## 2023-07-04 ENCOUNTER — Other Ambulatory Visit: Payer: Self-pay

## 2023-07-04 ENCOUNTER — Encounter (HOSPITAL_COMMUNITY): Payer: Self-pay

## 2023-07-04 VITALS — BP 120/84 | HR 67 | Temp 98.2°F | Resp 18 | Ht 70.0 in | Wt 290.0 lb

## 2023-07-04 DIAGNOSIS — R001 Bradycardia, unspecified: Secondary | ICD-10-CM | POA: Insufficient documentation

## 2023-07-04 DIAGNOSIS — N400 Enlarged prostate without lower urinary tract symptoms: Secondary | ICD-10-CM | POA: Insufficient documentation

## 2023-07-04 DIAGNOSIS — N201 Calculus of ureter: Secondary | ICD-10-CM | POA: Insufficient documentation

## 2023-07-04 DIAGNOSIS — Z7982 Long term (current) use of aspirin: Secondary | ICD-10-CM | POA: Insufficient documentation

## 2023-07-04 DIAGNOSIS — Z01818 Encounter for other preprocedural examination: Secondary | ICD-10-CM

## 2023-07-04 DIAGNOSIS — G4730 Sleep apnea, unspecified: Secondary | ICD-10-CM | POA: Diagnosis not present

## 2023-07-04 DIAGNOSIS — Z01810 Encounter for preprocedural cardiovascular examination: Secondary | ICD-10-CM | POA: Diagnosis not present

## 2023-07-04 DIAGNOSIS — I1310 Hypertensive heart and chronic kidney disease without heart failure, with stage 1 through stage 4 chronic kidney disease, or unspecified chronic kidney disease: Secondary | ICD-10-CM | POA: Insufficient documentation

## 2023-07-04 DIAGNOSIS — N183 Chronic kidney disease, stage 3 unspecified: Secondary | ICD-10-CM | POA: Insufficient documentation

## 2023-07-04 DIAGNOSIS — Z960 Presence of urogenital implants: Secondary | ICD-10-CM | POA: Diagnosis not present

## 2023-07-04 DIAGNOSIS — I10 Essential (primary) hypertension: Secondary | ICD-10-CM

## 2023-07-05 NOTE — Progress Notes (Signed)
 Anesthesia Chart Review   Case: [id number] Date/Time: 07/11/23 1400   Procedure: CYSTOSCOPY/URETEROSCOPY/HOLMIUM LASER/STENT PLACEMENT (Right) - CYSTOSCOPY/RIGHT URETEROSCOPY/HOLMIUM LASER/STENT EXCHANGE/RETROGRADE PYELOGRAM   Anesthesia type: General   Diagnosis: Ureteral stone [N20.1]   Pre-op diagnosis: RIGHT URETERAL CALCULUS   Location: [facility] / [facility]   Surgeons: Roxane Copp, MD       DISCUSSION:63 y.o. never smoker with a history of HTN, sleep apnea with CPAP, CKD stage III, BPH, right ureteral calculus scheduled for above procedure 07/07/2023 with Dr. Conny Del pace.  Patient seen by cardiology 06/28/2023 for preoperative evaluation.  Per office visit note, "According to the Revised Cardiac Risk Index (RCRI), his Perioperative Risk of Major Cardiac Event is (%): 0.9. His Functional Capacity in METs is: 7.01 according to the Duke Activity Status Index (DASI). Therefore, based on ACC/AHA guidelines, patient would be at acceptable risk for the planned procedure without further cardiovascular testing.    The patient was advised that if he develops new symptoms prior to surgery to contact our office to arrange for a follow-up visit, and he verbalized understanding.   Ideally aspirin  should be continued without interruption, however if the bleeding risk is too great, aspirin  may be held for 5-7 days prior to surgery. Please resume aspirin  post operatively when it is felt to be safe from a bleeding standpoint."  Patient with recent admission 06/12/2023 to 06/15/2023 for obstructing kidney stone with mild to moderate right hydronephrosis.  Status post cystoscopy and stent placement during admission.  AKI improved at admission. VS: BP 120/84   Pulse 67   Temp 36.8 C (Oral)   Resp 18   Ht 5\' 10"  (1.778 m)   Wt 131.5 kg   SpO2 97%   BMI 41.61 kg/m   PROVIDERS: Jimmey Mould, MD is PCP  Primary Cardiologist:  Dorothye Gathers, MD   LABS: Labs reviewed: Acceptable for  surgery. (all labs ordered are listed, but only abnormal results are displayed)  Labs Reviewed - No data to display   IMAGES:   EKG:   CV: Echo 08/12/2022 1. Left ventricular ejection fraction, by estimation, is 60 to 65%. The  left ventricle has normal function. The left ventricle has no regional  wall motion abnormalities. Left ventricular diastolic parameters were  normal.   2. Right ventricular systolic function is normal. The right ventricular  size is normal.   3. Left atrial size was mildly dilated.   4. The mitral valve is normal in structure. Trivial mitral valve  regurgitation. No evidence of mitral stenosis.   5. The aortic valve is tricuspid. There is mild calcification of the  aortic valve. Aortic valve regurgitation is trivial. No aortic stenosis is  present.   6. The inferior vena cava is normal in size with greater than 50%  respiratory variability, suggesting right atrial pressure of 3 mmHg.   Past Medical History:  Diagnosis Date   Allergic rhinitis    Arthritis    "back" (12/29/2014)   BPH (benign prostatic hyperplasia)    Class 3 severe obesity due to excess calories with serious comorbidity and body mass index (BMI) of 40.0 to 44.9 in adult    DDD (degenerative disc disease), lumbar    Diverticulosis    ED (erectile dysfunction)    Edema    Elevated coronary artery calcium  score    Elevated fasting glucose    Exertional dyspnea    Fatigue    High cholesterol    History of kidney stones  HNP (herniated nucleus pulposus), lumbar    l4-5   HTN (hypertension)    Hypertension    Kidney stone    Leg fracture, right 1979   Leg fracture, right 1967   Liver cyst    Low testosterone     OA (osteoarthritis) of knee    Primary localized osteoarthritis of left knee 12/29/2014   Renal calculus    Sleep apnea    CPAP   Snoring    Vitamin B12 deficiency     Past Surgical History:  Procedure Laterality Date   ADENOIDECTOMY  ~ 1970   "not tonsils"    COLONOSCOPY     CYSTOSCOPY W/ STONE MANIPULATION  ~ 2002   CYSTOSCOPY W/ URETERAL STENT PLACEMENT Right 06/13/2023   Procedure: CYSTOSCOPY, WITH RETROGRADE PYELOGRAM AND URETERAL STENT INSERTION;  Surgeon: Homero Luster, MD;  Location: [facility] OR;  Service: Urology;  Laterality: Right;   LASIK Bilateral 2008   LITHOTRIPSY  X 1   NEPHROLITHOTOMY Left 12/30/2020   Procedure: LEFT NEPHROLITHOTOMY PERCUTANEOUS WTH  SURGEON ACCESS;  Surgeon: Roxane Copp, MD;  Location: [facility];  Service: Urology;  Laterality: Left;   TOTAL KNEE ARTHROPLASTY Left 12/29/2014   TOTAL KNEE ARTHROPLASTY Left 12/29/2014   Procedure: LEFT TOTAL KNEE ARTHROPLASTY;  Surgeon: Elly Habermann, MD;  Location: [facility] OR;  Service: Orthopedics;  Laterality: Left;    MEDICATIONS:  acetaminophen  (TYLENOL ) 500 MG tablet   amoxicillin  (AMOXIL ) 500 MG capsule   aspirin  EC 81 MG tablet   cetirizine (ZYRTEC) 10 MG tablet   fluticasone  (FLONASE ) 50 MCG/ACT nasal spray   HYDROcodone -acetaminophen  (NORCO/VICODIN) 5-325 MG tablet   montelukast  (SINGULAIR ) 10 MG tablet   Multiple Vitamins-Minerals (MENS 50+ MULTI VITAMIN/MIN) TABS   olmesartan  (BENICAR ) 40 MG tablet   ondansetron  (ZOFRAN -ODT) 4 MG disintegrating tablet   PRESCRIPTION MEDICATION   promethazine -dextromethorphan  (PROMETHAZINE -DM) 6.25-15 MG/5ML syrup   rosuvastatin  (CRESTOR ) 5 MG tablet   tamsulosin  (FLOMAX ) 0.4 MG CAPS capsule   No current facility-administered medications for this encounter.       Chick Cotton Ward, PA-C [facility] [phone]

## 2023-07-11 ENCOUNTER — Ambulatory Visit (HOSPITAL_COMMUNITY): Payer: Self-pay | Admitting: Physician Assistant

## 2023-07-11 ENCOUNTER — Ambulatory Visit (HOSPITAL_COMMUNITY)
Admission: RE | Admit: 2023-07-11 | Discharge: 2023-07-11 | Disposition: A | Discharge status: Home / Self Care | Attending: Urology | Admitting: Urology

## 2023-07-11 ENCOUNTER — Other Ambulatory Visit (HOSPITAL_COMMUNITY): Payer: Self-pay

## 2023-07-11 ENCOUNTER — Encounter (HOSPITAL_COMMUNITY)
Admission: RE | Disposition: A | Discharge status: Home / Self Care | Payer: Self-pay | Source: Home / Self Care | Attending: Urology

## 2023-07-11 ENCOUNTER — Encounter (HOSPITAL_COMMUNITY): Payer: Self-pay | Admitting: Urology

## 2023-07-11 ENCOUNTER — Ambulatory Visit (HOSPITAL_COMMUNITY): Admitting: Anesthesiology

## 2023-07-11 ENCOUNTER — Ambulatory Visit (HOSPITAL_COMMUNITY)

## 2023-07-11 ENCOUNTER — Other Ambulatory Visit: Payer: Self-pay

## 2023-07-11 DIAGNOSIS — Z79899 Other long term (current) drug therapy: Secondary | ICD-10-CM | POA: Diagnosis not present

## 2023-07-11 DIAGNOSIS — N132 Hydronephrosis with renal and ureteral calculous obstruction: Secondary | ICD-10-CM | POA: Diagnosis not present

## 2023-07-11 DIAGNOSIS — F419 Anxiety disorder, unspecified: Secondary | ICD-10-CM | POA: Insufficient documentation

## 2023-07-11 DIAGNOSIS — I10 Essential (primary) hypertension: Secondary | ICD-10-CM

## 2023-07-11 DIAGNOSIS — N201 Calculus of ureter: Secondary | ICD-10-CM | POA: Diagnosis not present

## 2023-07-11 DIAGNOSIS — F32A Depression, unspecified: Secondary | ICD-10-CM | POA: Insufficient documentation

## 2023-07-11 DIAGNOSIS — N179 Acute kidney failure, unspecified: Secondary | ICD-10-CM

## 2023-07-11 DIAGNOSIS — I708 Atherosclerosis of other arteries: Secondary | ICD-10-CM | POA: Diagnosis not present

## 2023-07-11 DIAGNOSIS — M1611 Unilateral primary osteoarthritis, right hip: Secondary | ICD-10-CM | POA: Insufficient documentation

## 2023-07-11 DIAGNOSIS — K5730 Diverticulosis of large intestine without perforation or abscess without bleeding: Secondary | ICD-10-CM | POA: Diagnosis not present

## 2023-07-11 DIAGNOSIS — E66813 Obesity, class 3: Secondary | ICD-10-CM | POA: Insufficient documentation

## 2023-07-11 DIAGNOSIS — G4733 Obstructive sleep apnea (adult) (pediatric): Secondary | ICD-10-CM | POA: Diagnosis not present

## 2023-07-11 DIAGNOSIS — G4730 Sleep apnea, unspecified: Secondary | ICD-10-CM | POA: Diagnosis not present

## 2023-07-11 DIAGNOSIS — I2510 Atherosclerotic heart disease of native coronary artery without angina pectoris: Secondary | ICD-10-CM

## 2023-07-11 DIAGNOSIS — Z6841 Body Mass Index (BMI) 40.0 and over, adult: Secondary | ICD-10-CM | POA: Diagnosis not present

## 2023-07-11 DIAGNOSIS — N400 Enlarged prostate without lower urinary tract symptoms: Secondary | ICD-10-CM | POA: Insufficient documentation

## 2023-07-11 HISTORY — PX: CYSTOSCOPY/URETEROSCOPY/HOLMIUM LASER/STENT PLACEMENT: SHX6546

## 2023-07-11 SURGERY — CYSTOSCOPY/URETEROSCOPY/HOLMIUM LASER/STENT PLACEMENT
Anesthesia: General | Laterality: Right

## 2023-07-11 MED ORDER — MIDAZOLAM HCL 2 MG/2ML IJ SOLN
INTRAMUSCULAR | Status: AC
  Filled 2023-07-11: qty 2

## 2023-07-11 MED ORDER — DEXAMETHASONE SODIUM PHOSPHATE 10 MG/ML IJ SOLN
INTRAMUSCULAR | Status: DC | PRN
Start: 2023-07-11 — End: 2023-07-11
  Administered 2023-07-11: 4 mg via INTRAVENOUS

## 2023-07-11 MED ORDER — FENTANYL CITRATE PF 50 MCG/ML IJ SOSY
PREFILLED_SYRINGE | INTRAMUSCULAR | Status: AC
  Filled 2023-07-11: qty 1

## 2023-07-11 MED ORDER — MIDAZOLAM HCL 2 MG/2ML IJ SOLN
INTRAMUSCULAR | Status: DC | PRN
Start: 2023-07-11 — End: 2023-07-11
  Administered 2023-07-11: 2 mg via INTRAVENOUS

## 2023-07-11 MED ORDER — OXYCODONE HCL 5 MG PO TABS
5.0000 mg | ORAL_TABLET | Freq: Once | ORAL | Status: AC | PRN
  Administered 2023-07-11: 5 mg via ORAL

## 2023-07-11 MED ORDER — ORAL CARE MOUTH RINSE: 15.0000 mL | Freq: Once | OROMUCOSAL | Status: AC

## 2023-07-11 MED ORDER — FENTANYL CITRATE (PF) 100 MCG/2ML IJ SOLN
INTRAMUSCULAR | Status: AC
  Filled 2023-07-11: qty 2

## 2023-07-11 MED ORDER — FENTANYL CITRATE (PF) 100 MCG/2ML IJ SOLN
INTRAMUSCULAR | Status: DC | PRN
  Administered 2023-07-11 (×2): 25 ug via INTRAVENOUS
  Administered 2023-07-11: 50 ug via INTRAVENOUS

## 2023-07-11 MED ORDER — SODIUM CHLORIDE 0.9 % IR SOLN
Status: DC | PRN
  Administered 2023-07-11: 3000 mL via INTRAVESICAL

## 2023-07-11 MED ORDER — FENTANYL CITRATE PF 50 MCG/ML IJ SOSY
25.0000 ug | PREFILLED_SYRINGE | INTRAMUSCULAR | Status: DC | PRN
  Administered 2023-07-11 (×3): 50 ug via INTRAVENOUS

## 2023-07-11 MED ORDER — PROPOFOL 10 MG/ML IV BOLUS
INTRAVENOUS | Status: AC
  Filled 2023-07-11: qty 20

## 2023-07-11 MED ORDER — CHLORHEXIDINE GLUCONATE 0.12 % MT SOLN
15.0000 mL | Freq: Once | OROMUCOSAL | Status: AC
  Administered 2023-07-11: 15 mL via OROMUCOSAL

## 2023-07-11 MED ORDER — OXYCODONE HCL 5 MG PO TABS
ORAL_TABLET | ORAL | Status: AC
  Filled 2023-07-11: qty 1

## 2023-07-11 MED ORDER — OXYCODONE HCL 5 MG/5ML PO SOLN: 5.0000 mg | Freq: Once | ORAL | Status: AC | PRN

## 2023-07-11 MED ORDER — ONDANSETRON HCL 4 MG/2ML IJ SOLN: 4.0000 mg | Freq: Once | INTRAMUSCULAR | Status: DC | PRN

## 2023-07-11 MED ORDER — ONDANSETRON HCL 4 MG/2ML IJ SOLN
INTRAMUSCULAR | Status: AC
  Filled 2023-07-11: qty 2

## 2023-07-11 MED ORDER — GLYCOPYRROLATE 0.2 MG/ML IJ SOLN
INTRAMUSCULAR | Status: DC | PRN
  Administered 2023-07-11: .1 mg via INTRAVENOUS

## 2023-07-11 MED ORDER — 0.9 % SODIUM CHLORIDE (POUR BTL) OPTIME
TOPICAL | Status: DC | PRN
  Administered 2023-07-11: 1000 mL

## 2023-07-11 MED ORDER — LACTATED RINGERS IV SOLN: INTRAVENOUS | Status: DC

## 2023-07-11 MED ORDER — CEFAZOLIN SODIUM-DEXTROSE 3-4 GM/150ML-% IV SOLN
3.0000 g | INTRAVENOUS | Status: AC
  Administered 2023-07-11: 3 g via INTRAVENOUS
  Filled 2023-07-11: qty 150

## 2023-07-11 MED ORDER — LIDOCAINE HCL (PF) 2 % IJ SOLN
INTRAMUSCULAR | Status: DC | PRN
Start: 2023-07-11 — End: 2023-07-11
  Administered 2023-07-11: 60 mg via INTRADERMAL

## 2023-07-11 MED ORDER — TRAMADOL HCL 50 MG PO TABS
50.0000 mg | ORAL_TABLET | Freq: Four times a day (QID) | ORAL | 0 refills | Status: AC | PRN
  Filled 2023-07-11: qty 20, 5d supply, fill #0

## 2023-07-11 MED ORDER — ACETAMINOPHEN 500 MG PO TABS: 1000.0000 mg | ORAL_TABLET | Freq: Once | ORAL | Status: DC

## 2023-07-11 MED ORDER — PROPOFOL 10 MG/ML IV BOLUS
INTRAVENOUS | Status: DC | PRN
  Administered 2023-07-11: 200 mg via INTRAVENOUS

## 2023-07-11 MED ORDER — ONDANSETRON HCL 4 MG/2ML IJ SOLN
INTRAMUSCULAR | Status: DC | PRN
  Administered 2023-07-11: 4 mg via INTRAVENOUS

## 2023-07-11 MED ORDER — DEXAMETHASONE SODIUM PHOSPHATE 10 MG/ML IJ SOLN
INTRAMUSCULAR | Status: AC
  Filled 2023-07-11: qty 1

## 2023-07-11 MED ORDER — EPHEDRINE SULFATE-NACL 50-0.9 MG/10ML-% IV SOSY
PREFILLED_SYRINGE | INTRAVENOUS | Status: DC | PRN
  Administered 2023-07-11: 5 mg via INTRAVENOUS

## 2023-07-11 SURGICAL SUPPLY — 18 items
BAG URO CATCHER STRL LF (MISCELLANEOUS) ×1 IMPLANT
BASKET ZERO TIP NITINOL 2.4FR (BASKET) IMPLANT
CATH URETL OPEN 5X70 (CATHETERS) ×1 IMPLANT
CLOTH BEACON ORANGE TIMEOUT ST (SAFETY) ×1 IMPLANT
DRSG TEGADERM 2-3/8X2-3/4 SM (GAUZE/BANDAGES/DRESSINGS) IMPLANT
FIBER LASER MOSES 200 DFL (Laser) IMPLANT
GLOVE BIO SURGEON STRL SZ 6.5 (GLOVE) ×1 IMPLANT
GOWN STRL REUS W/ TWL LRG LVL3 (GOWN DISPOSABLE) ×1 IMPLANT
GUIDEWIRE STR DUAL SENSOR (WIRE) ×1 IMPLANT
KIT TURNOVER KIT A (KITS) IMPLANT
MANIFOLD NEPTUNE II (INSTRUMENTS) ×1 IMPLANT
PACK CYSTO (CUSTOM PROCEDURE TRAY) ×1 IMPLANT
SHEATH NAVIGATOR HD 11/13X28 (SHEATH) IMPLANT
SHEATH NAVIGATOR HD 11/13X36 (SHEATH) IMPLANT
STENT URET 6FRX26 CONTOUR (STENTS) IMPLANT
TRACTIP FLEXIVA PULS ID 200XHI (Laser) IMPLANT
TUBING CONNECTING 10 (TUBING) ×1 IMPLANT
TUBING UROLOGY SET (TUBING) ×1 IMPLANT

## 2023-07-11 NOTE — Anesthesia Preprocedure Evaluation (Addendum)
 Anesthesia Evaluation  Patient identified by MRN, date of birth, ID band Patient awake    Reviewed: Allergy & Precautions, NPO status , Patient's Chart, lab work & pertinent test results  History of Anesthesia Complications Negative for: history of anesthetic complications  Airway Mallampati: II  TM Distance: >3 FB Neck ROM: Full    Dental  (+) Dental Advisory Given   Pulmonary sleep apnea and Continuous Positive Airway Pressure Ventilation    Pulmonary exam normal        Cardiovascular hypertension, Pt. on medications + CAD  Normal cardiovascular exam   '24 TTE - EF 60 to 65%. Left atrial size was mildly dilated. Trivial mitral valve regurgitation. Aortic valve regurgitation is trivial.      Neuro/Psych  PSYCHIATRIC DISORDERS Anxiety Depression    negative neurological ROS     GI/Hepatic negative GI ROS, Neg liver ROS,,,  Endo/Other    Class 3 obesity  Renal/GU  Ureteral stone      Musculoskeletal  (+) Arthritis ,    Abdominal  (+) + obese  Peds  Hematology negative hematology ROS (+)   Anesthesia Other Findings   Reproductive/Obstetrics                             Anesthesia Physical Anesthesia Plan  ASA: 3  Anesthesia Plan: General   Post-op Pain Management: Tylenol  PO (pre-op)*   Induction: Intravenous  PONV Risk Score and Plan: 2 and Treatment may vary due to age or medical condition, Ondansetron , Dexamethasone  and Midazolam   Airway Management Planned: LMA  Additional Equipment: None  Intra-op Plan:   Post-operative Plan: Extubation in OR  Informed Consent: I have reviewed the patients History and Physical, chart, labs and discussed the procedure including the risks, benefits and alternatives for the proposed anesthesia with the patient or authorized representative who has indicated his/her understanding and acceptance.     Dental advisory given  Plan  Discussed with: CRNA, Anesthesiologist and Surgeon  Anesthesia Plan Comments:         Anesthesia Quick Evaluation

## 2023-07-11 NOTE — Interval H&P Note (Signed)
 History and Physical Interval Note: Patient underwent right ureteral stent placement approx 1 month ago and now returns for definitive stone removal  07/11/2023 12:07 PM  Adam Avila  has presented today for surgery, with the diagnosis of RIGHT URETERAL CALCULUS.  The various methods of treatment have been discussed with the patient and family. After consideration of risks, benefits and other options for treatment, the patient has consented to  Procedure(s) with comments: CYSTOSCOPY/URETEROSCOPY/HOLMIUM LASER/STENT PLACEMENT (Right) - CYSTOSCOPY/RIGHT URETEROSCOPY/HOLMIUM LASER/STENT EXCHANGE/RETROGRADE PYELOGRAM as a surgical intervention.  The patient's history has been reviewed, patient examined, no change in status, stable for surgery.  I have reviewed the patient's chart and labs.  Questions were answered to the patient's satisfaction.     [name] D Myrle Dues

## 2023-07-11 NOTE — Discharge Instructions (Signed)
 DISCHARGE INSTRUCTIONS FOR KIDNEY STONE/URETERAL STENT   MEDICATIONS:  1. Resume all your other meds from home  2. AZO over the counter can help with the burning/stinging when you urinate. 3. Tramadol is for moderate/severe pain, otherwise taking up to 1000 mg every 6 hours of plainTylenol will help treat your pain.      ACTIVITY:  1. No strenuous activity x 1week  2. No driving while on narcotic pain medications  3. Drink plenty of water   4. Continue to walk at home - you can still get blood clots when you are at home, so keep active, but don't over do it.  5. May return to work/school tomorrow or when you feel ready   BATHING:  1. You can shower and we recommend daily showers    SIGNS/SYMPTOMS TO CALL:  Please call us  if you have a fever greater than 101.5, uncontrolled nausea/vomiting, uncontrolled pain, dizziness, unable to urinate, bloody urine, chest pain, shortness of breath, leg swelling, leg pain, redness around wound, drainage from wound, or any other concerns or questions.   You can reach us  at [phone].   FOLLOW-UP:  1. Your stent will be removed next week at your appointment with Hewitt Lou

## 2023-07-11 NOTE — Anesthesia Procedure Notes (Signed)
 Procedure Name: LMA Insertion Date/Time: 07/11/2023 12:51 PM  Performed by: Mervyn Ace, CRNAPre-anesthesia Checklist: Patient identified, Emergency Drugs available, Suction available, Patient being monitored and Timeout performed Patient Re-evaluated:Patient Re-evaluated prior to induction Oxygen Delivery Method: Circle system utilized Preoxygenation: Pre-oxygenation with 100% oxygen Induction Type: IV induction Ventilation: Mask ventilation without difficulty LMA: LMA with gastric port inserted LMA Size: 4.0 Number of attempts: 1 Placement Confirmation: positive ETCO2, CO2 detector and breath sounds checked- equal and bilateral Tube secured with: Tape Dental Injury: Teeth and Oropharynx as per pre-operative assessment

## 2023-07-11 NOTE — Anesthesia Postprocedure Evaluation (Signed)
 Anesthesia Post Note  Patient: Adam Avila  Procedure(s) Performed: CYSTOSCOPY/URETEROSCOPY/HOLMIUM LASER/STENT PLACEMENT (Right)     Patient location during evaluation: PACU Anesthesia Type: General Level of consciousness: awake and alert Pain management: pain level controlled Vital Signs Assessment: post-procedure vital signs reviewed and stable Respiratory status: spontaneous breathing, nonlabored ventilation and respiratory function stable Cardiovascular status: stable and blood pressure returned to baseline Anesthetic complications: no  No notable events documented.  Last Vitals:  Vitals:   07/11/23 1415 07/11/23 1430  BP: 132/88 128/85  Pulse: 65 (!) 58  Resp: 14 15  Temp:    SpO2: 97% 99%    Last Pain:  Vitals:   07/11/23 1430  PainSc: 5                  Juventino Oppenheim

## 2023-07-11 NOTE — Op Note (Signed)
 Preoperative diagnosis: right ureteral calculus  Postoperative diagnosis: right ureteral calculus  Procedure:  Cystoscopy right ureteroscopy, laser lithotripsy, basket stone extraction right 31F x 26cm ureteral stent placement - no string  Surgeon: Perley Bradley, MD  Anesthesia: General  Complications: None  Intraoperative findings:  Normal urethra Bilateral lobe hypertrophy prostatic urethra Bilateral orthotropic ureteral orifices right retrograde pyelography demonstrated a filling defect within the right ureter consistent with the patient's known calculus without other abnormalities. Bladder mucosa normal without masses   EBL: Minimal  Specimens: right ureteral calculus  Disposition of specimens: [facility] for stone analysis  Indication: Adam Avila is a 63 y.o.   patient with a 4mm right ureteral stone and associated right symptoms who underwent urgent right ureteral stent placement for severe AKI.  He now returns for definitive management of the stone.  After reviewing the management options for treatment, the patient elected to proceed with the above surgical procedure(s). We have discussed the potential benefits and risks of the procedure, side effects of the proposed treatment, the likelihood of the patient achieving the goals of the procedure, and any potential problems that might occur during the procedure or recuperation. Informed consent has been obtained.   Description of procedure:  The patient was taken to the operating room and general anesthesia was induced.  The patient was placed in the dorsal lithotomy position, prepped and draped in the usual sterile fashion, and preoperative antibiotics were administered. A preoperative time-out was performed.   Cystourethroscopy was performed.  The patient's urethra was examined and was demonstrated bilobar prostatic hypertrophy. The bladder was then systematically examined in its entirety. There was no  evidence for any bladder tumors, stones, or other mucosal pathology.    Attention then turned to the right ureteral orifice and graspers were used to bring the existing ureteral stent to the urethral meatus.  A 0.38 sensor wire was then advanced through the ureteral stent up to the kidney and fluoroscopic guidance.  The stent was removed.  Next a separate sensor wire was placed alongside the for sensor wire and advanced to the kidney again under fluoroscopic guidance.  1 wire was secured as a safety wire.  A ureteral access sheath was then placed over the second wire and into the proximal ureter with fluoroscopy.  The inner sheath and wire removed.  Flexible ureteroscopy took place and the previously identified identified stone from CT had been pushed back into the lower pole.     The stone was then fragmented with the holmium laser fiber.  All stones were then removed from the ureter with a 0 tip basket.  Reinspection of the ureter revealed no remaining visible stones or fragments.   The wire was then backloaded through the cystoscope and a ureteral stent was advance over the wire using Seldinger technique.  The stent was positioned appropriately under fluoroscopic and cystoscopic guidance.  The wire was then removed with an adequate stent curl noted in the renal pelvis as well as in the bladder.  The bladder was then emptied and the procedure ended.  The patient appeared to tolerate the procedure well and without complications.  The patient was able to be awakened and transferred to the recovery unit in satisfactory condition.   Disposition: Stent to be removed in the office in 1 week with cystoscopy.  He will need follow up with Dr. Valeta Gaudier to discuss BPH after his stent removal apt.

## 2023-07-11 NOTE — Transfer of Care (Signed)
 Immediate Anesthesia Transfer of Care Note  Patient: Adam Avila  Procedure(s) Performed: CYSTOSCOPY/URETEROSCOPY/HOLMIUM LASER/STENT PLACEMENT (Right)  Patient Location: PACU  Anesthesia Type:General  Level of Consciousness: awake, alert , oriented, and patient cooperative  Airway & Oxygen Therapy: Patient Spontanous Breathing and Patient connected to face mask oxygen  Post-op Assessment: Report given to RN and Post -op Vital signs reviewed and stable  Post vital signs: Reviewed and stable  Last Vitals:  Vitals Value Taken Time  BP 124/79 07/11/23 1343  Temp    Pulse 70 07/11/23 1344  Resp 15 07/11/23 1344  SpO2 97 % 07/11/23 1344  Vitals shown include unfiled device data.  Last Pain:  Vitals:   07/11/23 1114  PainSc: 0-No pain         Complications: No notable events documented.

## 2023-07-12 ENCOUNTER — Encounter (HOSPITAL_COMMUNITY): Payer: Self-pay | Admitting: Urology

## 2023-07-12 DIAGNOSIS — N200 Calculus of kidney: Secondary | ICD-10-CM | POA: Diagnosis not present

## 2023-07-12 DIAGNOSIS — G4733 Obstructive sleep apnea (adult) (pediatric): Secondary | ICD-10-CM | POA: Diagnosis not present

## 2023-07-14 IMAGING — CT CT ABD-PELV W/O CM
2 of 4 series · 15 of 46 positions shown, 17 images · non-contrast
Comparison: 10/22/2020

CLINICAL DATA: Status post left nephro lithotomy with stent
placement.

EXAM:
CT ABDOMEN AND PELVIS WITHOUT CONTRAST
TECHNIQUE: Multidetector CT imaging of the abdomen and pelvis was performed
following the standard protocol without IV contrast.

[Series 2: axial st · axial · 0.98mm/px · z∈[+1155,+1605]mm · 12 of 102 slices shown, 14 images]
[im 6/102  soft-tissue]
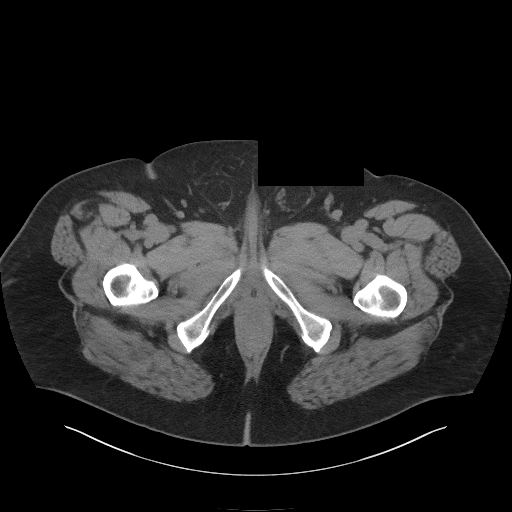
[im 6/102  bone]
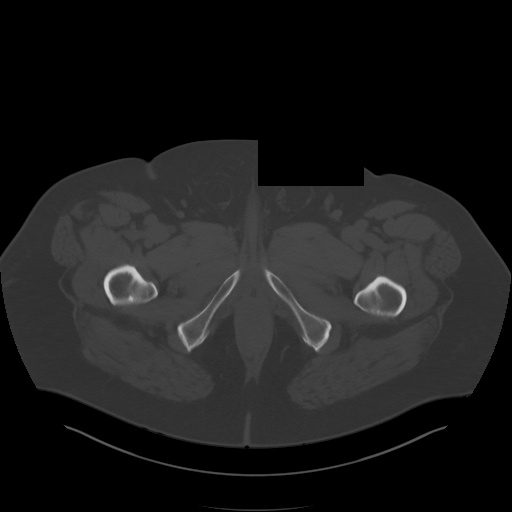
[im 16/102  soft-tissue]
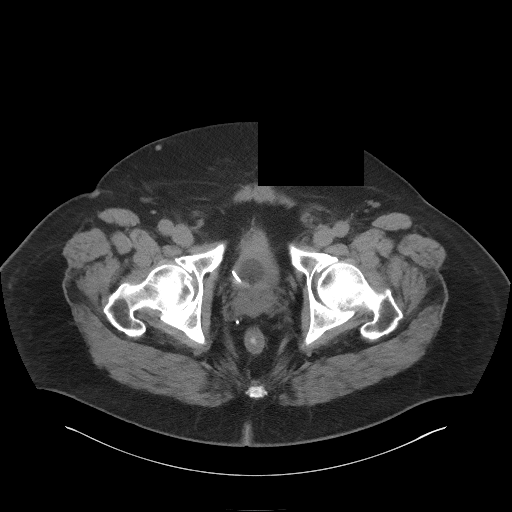
[im 21/102  soft-tissue]
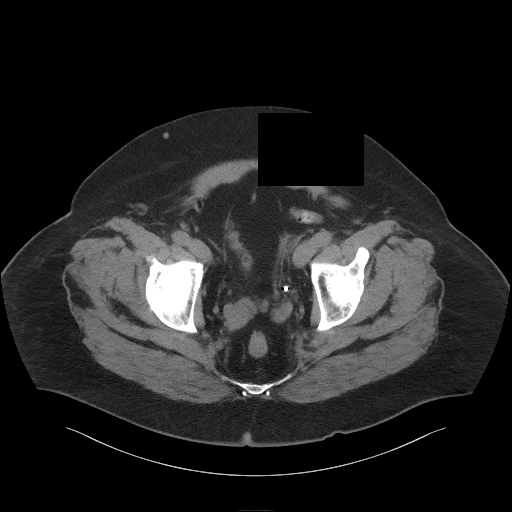
[im 31/102  soft-tissue]
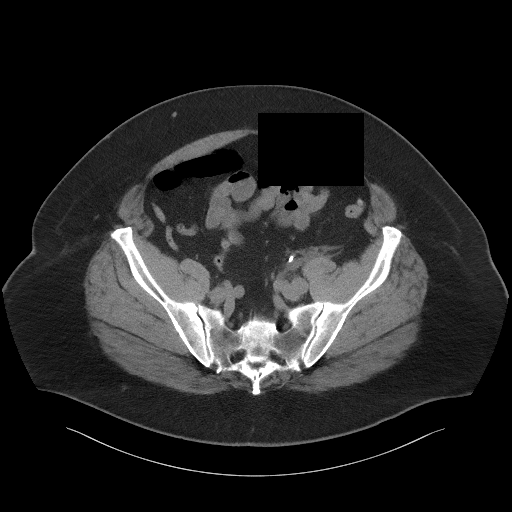
[im 41/102  soft-tissue]
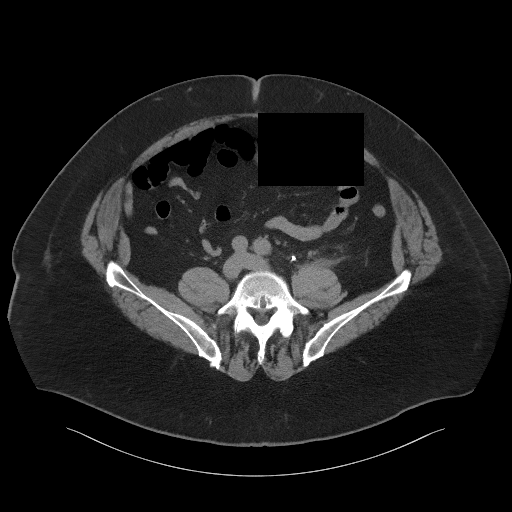
[im 46/102  soft-tissue]
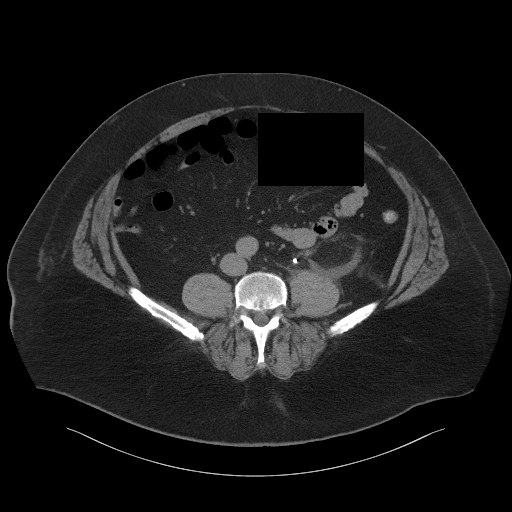
[im 56/102  soft-tissue]
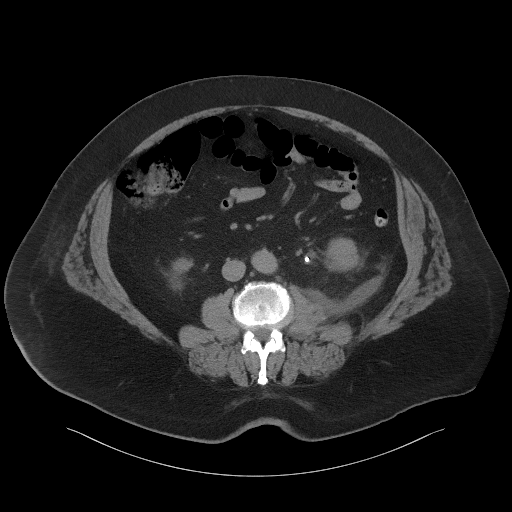
[im 61/102  soft-tissue]
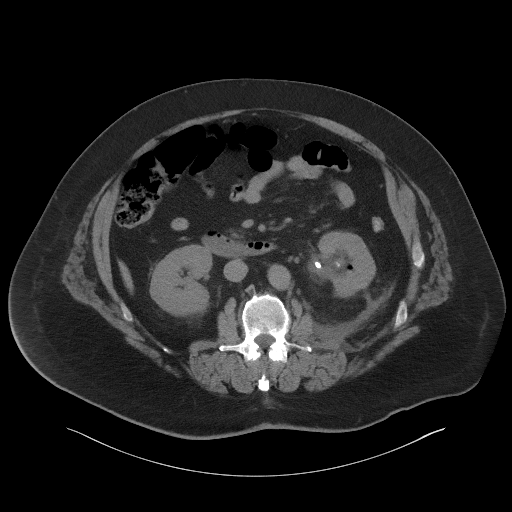
[im 71/102  soft-tissue]
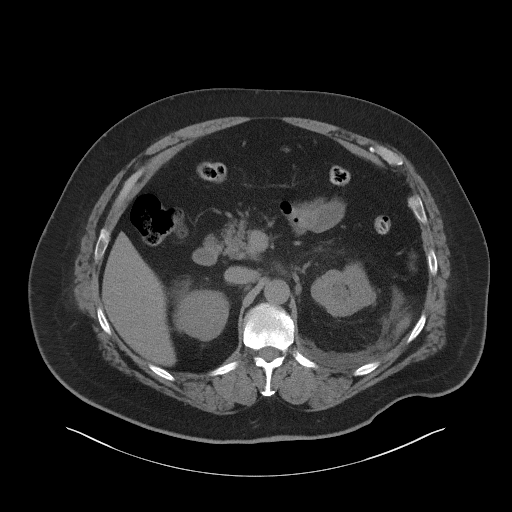
[im 71/102  bone]
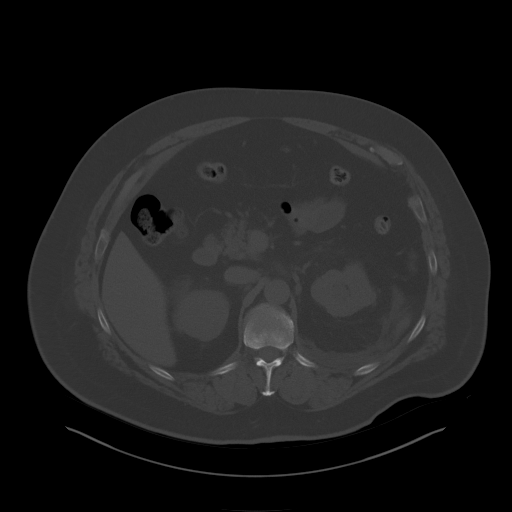
[im 81/102  soft-tissue]
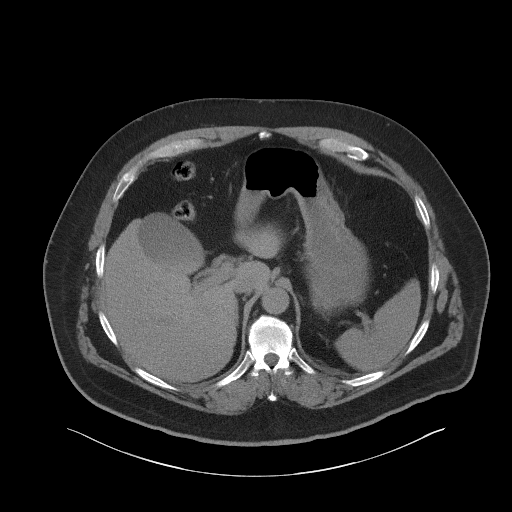
[im 86/102  soft-tissue]
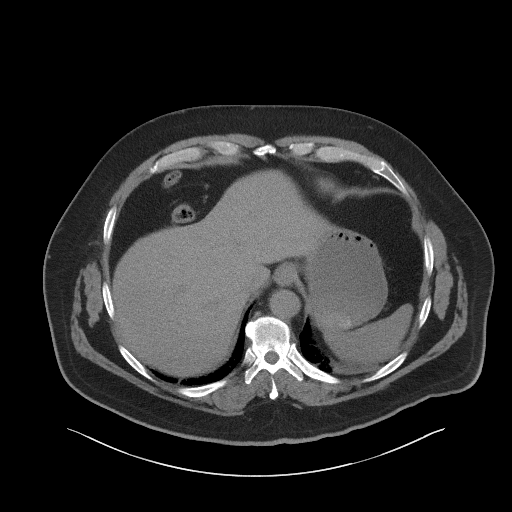
[im 96/102  soft-tissue]
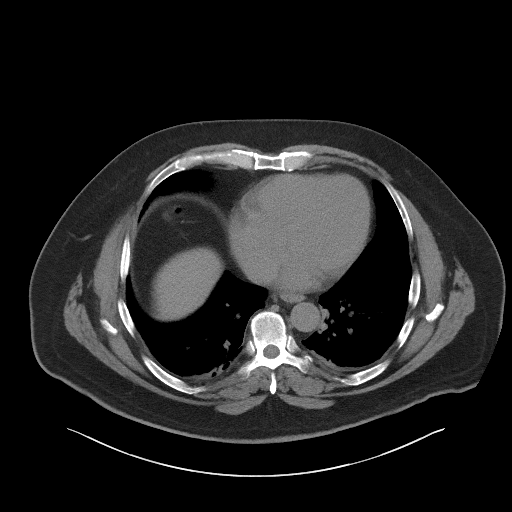

[Series 4: coronal st · coronal · 0.91mm/px · 3 of 112 slices shown]
[im 38/112  soft-tissue]
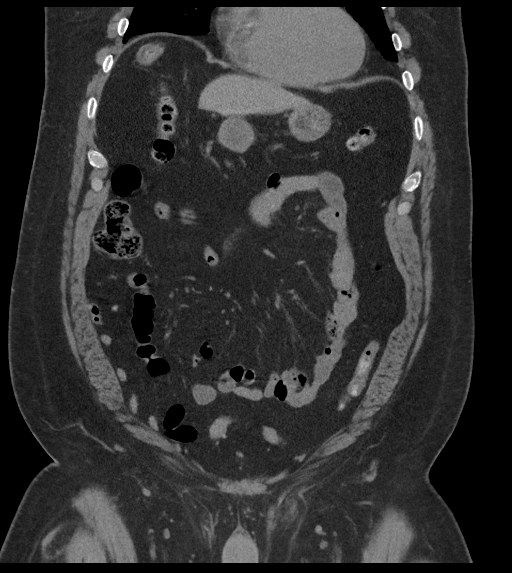
[im 50/112  soft-tissue]
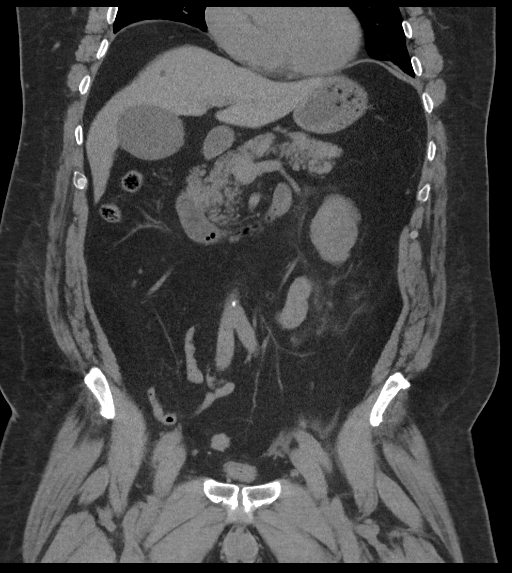
[im 62/112  soft-tissue]
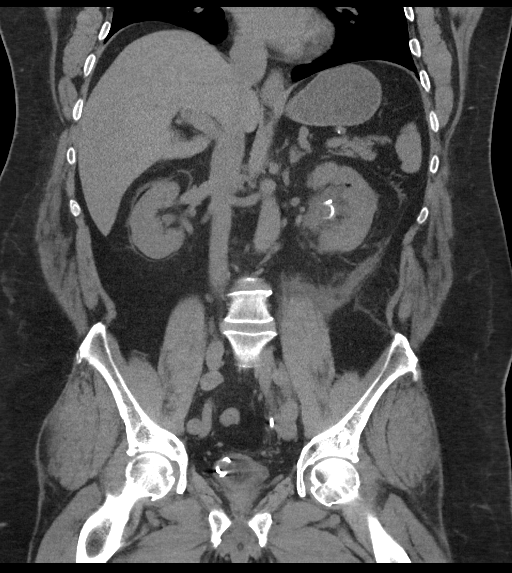

[15 of 46 positions shown; findings below may reference images not displayed]

FINDINGS: Lower chest: Dependent atelectasis noted in both lung bases.

Hepatobiliary: Tiny 6 mm hypodensity subcapsular anterior liver is
stable, too small to characterize but likely benign. The liver shows
diffusely decreased attenuation suggesting fat deposition. Layering
sludge noted in the gallbladder lumen. No intrahepatic or
extrahepatic biliary dilation.

Pancreas: No focal mass lesion. No dilatation of the main duct. No
intraparenchymal cyst. No peripancreatic edema.

Spleen: No splenomegaly. No focal mass lesion.

Adrenals/Urinary Tract: No adrenal nodule or mass. Right kidney and
ureter unremarkable.

Mild left perinephric edema or hemorrhage evident with some fullness
in the left intrarenal collecting system. No focal perinephric or
retroperitoneal hematoma. Gas in the collecting system is compatible
with the recent surgery/instrumentation. Dominant pelvic and lower
pole stone seen on the previous exam has been largely removed with a
small cluster of stone debris identified in the lower pole calyx
measuring 10 x 9 mm (image 44/2). 2 mm stone identified posterior
interpolar calyx on 35/2. Proximal loop of a an internal ureteral
stent is formed in the left renal pelvis. No evidence for stone
fragments in the left ureter or bladder. Foley catheter decompresses
the urinary bladder and gas in the bladder is compatible with the
instrumentation.

Stomach/Bowel: Stomach is unremarkable. No gastric wall thickening.
No evidence of outlet obstruction. Duodenum is normally positioned
as is the ligament of Treitz. No small bowel wall thickening. No
small bowel dilatation. The terminal ileum is normal. The appendix
is normal. No gross colonic mass. No colonic wall thickening.

Vascular/Lymphatic: There is mild atherosclerotic calcification of
the abdominal aorta without aneurysm. There is no gastrohepatic or
hepatoduodenal ligament lymphadenopathy. No retroperitoneal or
mesenteric lymphadenopathy. No pelvic sidewall lymphadenopathy.

Reproductive: The prostate gland and seminal vesicles are
unremarkable.

Other: No intraperitoneal free fluid.

Musculoskeletal: Small bilateral groin hernias contain only fat. No
worrisome lytic or sclerotic osseous abnormality.
IMPRESSION: 1. Interval removal of the dominant left pelvic and lower pole renal
stone with a small cluster of stone debris identified in the lower
pole calyx of the left kidney. No evidence for stone fragments in
the left ureter or bladder.
2. Mild left perinephric edema or hemorrhage with some fullness in
the left intrarenal collecting system. No focal perinephric or
retroperitoneal hematoma.
3. Gas in the left collecting system and urinary bladder is
compatible with the recent surgery/instrumentation.
4. Small bilateral groin hernias contain only fat.
5. Hepatic steatosis.
6. Aortic Atherosclerosis ([id number]).

## 2023-07-17 ENCOUNTER — Other Ambulatory Visit (HOSPITAL_COMMUNITY): Payer: Self-pay

## 2023-07-17 DIAGNOSIS — N201 Calculus of ureter: Secondary | ICD-10-CM | POA: Diagnosis not present

## 2023-07-17 MED ORDER — TAMSULOSIN HCL 0.4 MG PO CAPS
0.4000 mg | ORAL_CAPSULE | Freq: Every day | ORAL | 11 refills | Status: AC
  Filled 2023-07-17: qty 30, 30d supply, fill #0
  Filled 2023-08-07 – 2023-08-10 (×3): qty 30, 30d supply, fill #1
  Filled 2023-09-01 – 2023-09-04 (×3): qty 30, 30d supply, fill #2
  Filled 2023-10-01: qty 30, 30d supply, fill #3
  Filled 2023-11-05: qty 30, 30d supply, fill #4
  Filled 2023-11-28 – 2023-11-29 (×2): qty 30, 30d supply, fill #5
  Filled 2023-12-31: qty 30, 30d supply, fill #6
  Filled 2024-01-30: qty 30, 30d supply, fill #7
  Filled 2024-03-09: qty 30, 30d supply, fill #8

## 2023-07-26 ENCOUNTER — Encounter (HOSPITAL_BASED_OUTPATIENT_CLINIC_OR_DEPARTMENT_OTHER): Payer: Self-pay | Admitting: Cardiology

## 2023-08-02 ENCOUNTER — Telehealth: Payer: Self-pay | Admitting: Pharmacy Technician

## 2023-08-02 ENCOUNTER — Other Ambulatory Visit (HOSPITAL_COMMUNITY): Payer: Self-pay

## 2023-08-02 ENCOUNTER — Other Ambulatory Visit: Payer: Self-pay

## 2023-08-02 DIAGNOSIS — E66813 Obesity, class 3: Secondary | ICD-10-CM | POA: Diagnosis not present

## 2023-08-02 DIAGNOSIS — G4733 Obstructive sleep apnea (adult) (pediatric): Secondary | ICD-10-CM | POA: Diagnosis not present

## 2023-08-02 DIAGNOSIS — Z6841 Body Mass Index (BMI) 40.0 and over, adult: Secondary | ICD-10-CM | POA: Diagnosis not present

## 2023-08-02 MED ORDER — MOUNJARO 2.5 MG/0.5ML ~~LOC~~ SOAJ
2.5000 mg | SUBCUTANEOUS | 0 refills | Status: AC
  Filled 2023-08-02 – 2023-08-04 (×3): qty 2, 28d supply, fill #0

## 2023-08-02 NOTE — Telephone Encounter (Signed)
 Pharmacy Patient Advocate Encounter   Received notification from Physician's Office that prior authorization for Zepbound 2.5MG  is required/requested.   Insurance verification completed.   The patient is insured through [facility] . Excluded drug- exception process not availble-    Per test claim:

## 2023-08-03 ENCOUNTER — Other Ambulatory Visit (HOSPITAL_COMMUNITY): Payer: Self-pay

## 2023-08-04 ENCOUNTER — Other Ambulatory Visit (HOSPITAL_COMMUNITY): Payer: Self-pay

## 2023-08-07 ENCOUNTER — Encounter (HOSPITAL_COMMUNITY): Payer: Self-pay

## 2023-08-07 ENCOUNTER — Other Ambulatory Visit (HOSPITAL_COMMUNITY): Payer: Self-pay

## 2023-08-07 DIAGNOSIS — N179 Acute kidney failure, unspecified: Secondary | ICD-10-CM | POA: Diagnosis not present

## 2023-08-07 DIAGNOSIS — I129 Hypertensive chronic kidney disease with stage 1 through stage 4 chronic kidney disease, or unspecified chronic kidney disease: Secondary | ICD-10-CM | POA: Diagnosis not present

## 2023-08-07 DIAGNOSIS — Z87442 Personal history of urinary calculi: Secondary | ICD-10-CM | POA: Diagnosis not present

## 2023-08-07 LAB — LAB REPORT - SCANNED: EGFR: 60

## 2023-08-07 MED ORDER — WEGOVY 0.25 MG/0.5ML ~~LOC~~ SOAJ
0.2500 mg | SUBCUTANEOUS | 1 refills | Status: DC
  Filled 2023-08-07 – 2023-08-09 (×4): qty 2, 28d supply, fill #0

## 2023-08-08 ENCOUNTER — Other Ambulatory Visit (HOSPITAL_COMMUNITY): Payer: Self-pay

## 2023-08-08 ENCOUNTER — Other Ambulatory Visit: Payer: Self-pay

## 2023-08-09 ENCOUNTER — Other Ambulatory Visit (HOSPITAL_COMMUNITY): Payer: Self-pay

## 2023-08-10 ENCOUNTER — Other Ambulatory Visit (HOSPITAL_COMMUNITY): Payer: Self-pay

## 2023-08-10 MED ORDER — WEGOVY 0.25 MG/0.5ML ~~LOC~~ SOAJ
SUBCUTANEOUS | 1 refills | Status: DC
  Filled 2023-08-10: qty 2, 28d supply, fill #0

## 2023-08-11 ENCOUNTER — Other Ambulatory Visit (HOSPITAL_COMMUNITY): Payer: Self-pay

## 2023-08-11 DIAGNOSIS — N201 Calculus of ureter: Secondary | ICD-10-CM | POA: Diagnosis not present

## 2023-08-11 MED ORDER — SEMAGLUTIDE-WEIGHT MANAGEMENT 0.5 MG/0.5ML ~~LOC~~ SOAJ
0.5000 mg | SUBCUTANEOUS | 0 refills | Status: AC
  Filled 2023-08-11: qty 2, 28d supply, fill #0

## 2023-08-12 DIAGNOSIS — G4733 Obstructive sleep apnea (adult) (pediatric): Secondary | ICD-10-CM | POA: Diagnosis not present

## 2023-08-14 ENCOUNTER — Other Ambulatory Visit (HOSPITAL_COMMUNITY): Payer: Self-pay

## 2023-08-14 DIAGNOSIS — R3914 Feeling of incomplete bladder emptying: Secondary | ICD-10-CM | POA: Diagnosis not present

## 2023-08-14 DIAGNOSIS — N5201 Erectile dysfunction due to arterial insufficiency: Secondary | ICD-10-CM | POA: Diagnosis not present

## 2023-08-14 DIAGNOSIS — Z87442 Personal history of urinary calculi: Secondary | ICD-10-CM | POA: Diagnosis not present

## 2023-08-14 DIAGNOSIS — N401 Enlarged prostate with lower urinary tract symptoms: Secondary | ICD-10-CM | POA: Diagnosis not present

## 2023-08-14 MED ORDER — TADALAFIL 5 MG PO TABS
5.0000 mg | ORAL_TABLET | Freq: Every day | ORAL | 6 refills | Status: DC
  Filled 2023-08-14: qty 30, 30d supply, fill #0
  Filled 2023-09-01: qty 30, 30d supply, fill #1
  Filled 2023-09-21: qty 30, 30d supply, fill #2
  Filled 2023-10-25: qty 30, 30d supply, fill #3
  Filled 2023-11-28: qty 30, 30d supply, fill #4
  Filled 2023-12-31: qty 30, 30d supply, fill #5
  Filled 2024-01-30: qty 30, 30d supply, fill #6

## 2023-08-21 DIAGNOSIS — R972 Elevated prostate specific antigen [PSA]: Secondary | ICD-10-CM | POA: Diagnosis not present

## 2023-09-01 ENCOUNTER — Other Ambulatory Visit (HOSPITAL_COMMUNITY): Payer: Self-pay

## 2023-09-11 DIAGNOSIS — G4733 Obstructive sleep apnea (adult) (pediatric): Secondary | ICD-10-CM | POA: Diagnosis not present

## 2023-09-21 ENCOUNTER — Other Ambulatory Visit (HOSPITAL_COMMUNITY): Payer: Self-pay

## 2023-09-21 ENCOUNTER — Other Ambulatory Visit: Payer: Self-pay

## 2023-09-22 ENCOUNTER — Other Ambulatory Visit (HOSPITAL_COMMUNITY): Payer: Self-pay

## 2023-09-22 DIAGNOSIS — Z6841 Body Mass Index (BMI) 40.0 and over, adult: Secondary | ICD-10-CM | POA: Diagnosis not present

## 2023-09-22 DIAGNOSIS — E66813 Obesity, class 3: Secondary | ICD-10-CM | POA: Diagnosis not present

## 2023-09-22 DIAGNOSIS — I10 Essential (primary) hypertension: Secondary | ICD-10-CM | POA: Diagnosis not present

## 2023-09-22 MED ORDER — WEGOVY 1 MG/0.5ML ~~LOC~~ SOAJ
1.0000 mg | SUBCUTANEOUS | 3 refills | Status: AC
  Filled 2023-09-22: qty 2, 28d supply, fill #0
  Filled 2023-10-17: qty 2, 28d supply, fill #1

## 2023-09-23 ENCOUNTER — Other Ambulatory Visit (HOSPITAL_COMMUNITY): Payer: Self-pay

## 2023-09-25 ENCOUNTER — Other Ambulatory Visit (HOSPITAL_COMMUNITY): Payer: Self-pay

## 2023-10-09 ENCOUNTER — Other Ambulatory Visit (HOSPITAL_COMMUNITY): Payer: Self-pay

## 2023-10-12 DIAGNOSIS — G4733 Obstructive sleep apnea (adult) (pediatric): Secondary | ICD-10-CM | POA: Diagnosis not present

## 2023-10-17 ENCOUNTER — Other Ambulatory Visit (HOSPITAL_COMMUNITY): Payer: Self-pay

## 2023-10-17 DIAGNOSIS — N1831 Chronic kidney disease, stage 3a: Secondary | ICD-10-CM | POA: Diagnosis not present

## 2023-10-25 ENCOUNTER — Other Ambulatory Visit (HOSPITAL_COMMUNITY): Payer: Self-pay

## 2023-10-26 DIAGNOSIS — I129 Hypertensive chronic kidney disease with stage 1 through stage 4 chronic kidney disease, or unspecified chronic kidney disease: Secondary | ICD-10-CM | POA: Diagnosis not present

## 2023-10-26 DIAGNOSIS — N1831 Chronic kidney disease, stage 3a: Secondary | ICD-10-CM | POA: Diagnosis not present

## 2023-10-31 ENCOUNTER — Other Ambulatory Visit (HOSPITAL_COMMUNITY): Payer: Self-pay

## 2023-11-12 DIAGNOSIS — G4733 Obstructive sleep apnea (adult) (pediatric): Secondary | ICD-10-CM | POA: Diagnosis not present

## 2023-11-13 ENCOUNTER — Other Ambulatory Visit (HOSPITAL_COMMUNITY): Payer: Self-pay

## 2023-11-28 ENCOUNTER — Other Ambulatory Visit (HOSPITAL_COMMUNITY): Payer: Self-pay

## 2023-11-28 ENCOUNTER — Encounter (HOSPITAL_COMMUNITY): Payer: Self-pay

## 2023-11-29 ENCOUNTER — Encounter (HOSPITAL_COMMUNITY): Payer: Self-pay

## 2023-11-30 ENCOUNTER — Other Ambulatory Visit (HOSPITAL_COMMUNITY): Payer: Self-pay

## 2023-11-30 MED ORDER — WEGOVY 2.4 MG/0.75ML ~~LOC~~ SOAJ
2.4000 mg | SUBCUTANEOUS | 0 refills | Status: AC
  Filled 2023-11-30: qty 9, 84d supply, fill #0
  Filled 2023-11-30: qty 3, 28d supply, fill #0
  Filled 2024-02-27: qty 3, 28d supply, fill #1

## 2023-12-31 ENCOUNTER — Other Ambulatory Visit: Payer: Self-pay | Admitting: Cardiology

## 2023-12-31 DIAGNOSIS — I25118 Atherosclerotic heart disease of native coronary artery with other forms of angina pectoris: Secondary | ICD-10-CM

## 2024-01-01 ENCOUNTER — Other Ambulatory Visit (HOSPITAL_COMMUNITY): Payer: Self-pay

## 2024-01-01 ENCOUNTER — Other Ambulatory Visit: Payer: Self-pay

## 2024-01-01 MED ORDER — ROSUVASTATIN CALCIUM 5 MG PO TABS
5.0000 mg | ORAL_TABLET | Freq: Every day | ORAL | 0 refills | Status: AC
  Filled 2024-01-01: qty 90, 90d supply, fill #0

## 2024-01-24 ENCOUNTER — Encounter (HOSPITAL_BASED_OUTPATIENT_CLINIC_OR_DEPARTMENT_OTHER): Payer: Self-pay | Admitting: Cardiology

## 2024-01-30 ENCOUNTER — Other Ambulatory Visit (HOSPITAL_COMMUNITY): Payer: Self-pay

## 2024-01-30 ENCOUNTER — Other Ambulatory Visit: Payer: Self-pay

## 2024-01-30 MED ORDER — OLMESARTAN MEDOXOMIL 40 MG PO TABS
40.0000 mg | ORAL_TABLET | Freq: Every day | ORAL | 6 refills | Status: AC
  Filled 2024-01-30: qty 30, 30d supply, fill #0
  Filled 2024-03-09: qty 30, 30d supply, fill #1

## 2024-02-12 ENCOUNTER — Other Ambulatory Visit (HOSPITAL_BASED_OUTPATIENT_CLINIC_OR_DEPARTMENT_OTHER): Payer: Self-pay

## 2024-02-12 ENCOUNTER — Emergency Department (HOSPITAL_BASED_OUTPATIENT_CLINIC_OR_DEPARTMENT_OTHER): Admitting: Radiology

## 2024-02-12 ENCOUNTER — Encounter (HOSPITAL_BASED_OUTPATIENT_CLINIC_OR_DEPARTMENT_OTHER): Payer: Self-pay | Admitting: Emergency Medicine

## 2024-02-12 ENCOUNTER — Emergency Department (HOSPITAL_BASED_OUTPATIENT_CLINIC_OR_DEPARTMENT_OTHER)
Admission: EM | Admit: 2024-02-12 | Discharge: 2024-02-12 | Disposition: A | Discharge status: Home / Self Care | Attending: Emergency Medicine | Admitting: Emergency Medicine

## 2024-02-12 ENCOUNTER — Other Ambulatory Visit: Payer: Self-pay

## 2024-02-12 DIAGNOSIS — J019 Acute sinusitis, unspecified: Secondary | ICD-10-CM

## 2024-02-12 DIAGNOSIS — Z7982 Long term (current) use of aspirin: Secondary | ICD-10-CM | POA: Diagnosis not present

## 2024-02-12 DIAGNOSIS — R051 Acute cough: Secondary | ICD-10-CM | POA: Diagnosis present

## 2024-02-12 DIAGNOSIS — J0110 Acute frontal sinusitis, unspecified: Secondary | ICD-10-CM | POA: Diagnosis not present

## 2024-02-12 LAB — CBC WITH DIFFERENTIAL/PLATELET
Abs Immature Granulocytes: 0.06 K/uL (ref 0.00–0.07)
Basophils Absolute: 0 K/uL (ref 0.0–0.1)
Basophils Relative: 0 %
Eosinophils Absolute: 0.1 K/uL (ref 0.0–0.5)
Eosinophils Relative: 1 %
HCT: 46.4 % (ref 39.0–52.0)
Hemoglobin: 15.8 g/dL (ref 13.0–17.0)
Immature Granulocytes: 1 %
Lymphocytes Relative: 25 %
Lymphs Abs: 1.5 K/uL (ref 0.7–4.0)
MCH: 30.7 pg (ref 26.0–34.0)
MCHC: 34.1 g/dL (ref 30.0–36.0)
MCV: 90.1 fL (ref 80.0–100.0)
Monocytes Absolute: 0.9 K/uL (ref 0.1–1.0)
Monocytes Relative: 14 %
Neutro Abs: 3.7 K/uL (ref 1.7–7.7)
Neutrophils Relative %: 59 %
Platelets: 215 K/uL (ref 150–400)
RBC: 5.15 MIL/uL (ref 4.22–5.81)
RDW: 12.5 % (ref 11.5–15.5)
WBC: 6.2 K/uL (ref 4.0–10.5)
nRBC: 0 % (ref 0.0–0.2)

## 2024-02-12 LAB — BASIC METABOLIC PANEL WITH GFR
Anion gap: 9 (ref 5–15)
BUN: 17 mg/dL (ref 8–23)
CO2: 27 mmol/L (ref 22–32)
Calcium: 10.2 mg/dL (ref 8.9–10.3)
Chloride: 106 mmol/L (ref 98–111)
Creatinine, Ser: 1.28 mg/dL — ABNORMAL HIGH (ref 0.61–1.24)
GFR, Estimated: >60 mL/min
Glucose, Bld: 80 mg/dL (ref 70–99)
Potassium: 4.2 mmol/L (ref 3.5–5.1)
Sodium: 141 mmol/L (ref 135–145)

## 2024-02-12 LAB — TROPONIN T, HIGH SENSITIVITY: Troponin T High Sensitivity: <15 ng/L (ref 0–19)

## 2024-02-12 MED ORDER — BENZONATATE 100 MG PO CAPS
100.0000 mg | ORAL_CAPSULE | Freq: Three times a day (TID) | ORAL | 0 refills | Status: AC
  Filled 2024-02-12: qty 15, 5d supply, fill #0

## 2024-02-12 MED ORDER — ASPIRIN 81 MG PO CHEW: 324.0000 mg | CHEWABLE_TABLET | Freq: Once | ORAL | Status: DC

## 2024-02-12 MED ORDER — ALBUTEROL SULFATE HFA 108 (90 BASE) MCG/ACT IN AERS
2.0000 | INHALATION_SPRAY | RESPIRATORY_TRACT | 0 refills | Status: AC | PRN
  Filled 2024-02-12: qty 6.7, 25d supply, fill #0

## 2024-02-12 MED ORDER — AMOXICILLIN-POT CLAVULANATE 875-125 MG PO TABS
1.0000 | ORAL_TABLET | Freq: Two times a day (BID) | ORAL | 0 refills | Status: AC
  Filled 2024-02-12: qty 14, 7d supply, fill #0

## 2024-02-12 MED ORDER — ALBUTEROL SULFATE HFA 108 (90 BASE) MCG/ACT IN AERS: 2.0000 | INHALATION_SPRAY | RESPIRATORY_TRACT | Status: DC | PRN

## 2024-02-12 NOTE — ED Notes (Signed)

## 2024-02-12 NOTE — Discharge Instructions (Signed)
 Please read and follow all provided instructions.  Your diagnoses today include:  1. Acute non-recurrent sinusitis, unspecified location   2. Acute cough     You appear to have an upper respiratory infection (URI). An upper respiratory tract infection, or cold, is a viral infection of the air passages leading to the lungs. It should improve gradually after 5-7 days. You may have a lingering cough that lasts for 2- 4 weeks after the infection.  Tests performed today include: Vital signs. See below for your results today.  Complete blood cell count: Was normal Basic metabolic panel: Mild elevation in creatinine otherwise was normal Cardiac enzymes (blood test looking for stress on the heart): Was normal Chest x-ray: No pneumonia or other problems  Medications prescribed:  Albuterol inhaler - medication that opens up your airway  Use inhaler as follows: 1-2 puffs with spacer every 4 hours as needed for wheezing, cough, or shortness of breath.   Augmentin  - antibiotic  You have been prescribed an antibiotic medicine: take the entire course of medicine even if you are feeling better. Stopping early can cause the antibiotic not to work.  Tessalon  Perles - cough suppressant medication  Take any prescribed medications only as directed. Treatment for your infection is aimed at treating the symptoms. There are no medications, such as antibiotics, that will cure your infection.   Home care instructions:  You can take Tylenol  and/or Ibuprofen as directed on the packaging for fever reduction and pain relief.    For cough: honey 1/2 to 1 teaspoon (you can dilute the honey in water  or another fluid).  You can also use guaifenesin and dextromethorphan  for cough. You can use a humidifier for chest congestion and cough.  If you don't have a humidifier, you can sit in the bathroom with the hot shower running.      For sore throat: try warm salt water  gargles, cepacol lozenges, throat spray, warm tea  or water  with lemon/honey, popsicles or ice, or OTC cold relief medicine for throat discomfort.    For congestion: take a daily anti-histamine like Zyrtec, Claritin , and a oral decongestant, such as pseudoephedrine.  You can also use Flonase  1-2 sprays in each nostril daily.    It is important to stay hydrated: drink plenty of fluids (water , gatorade/powerade/pedialyte, juices, or teas) to keep your throat moisturized and help further relieve irritation/discomfort.   Your illness is contagious and can be spread to others, especially during the first 3 or 4 days. It cannot be cured by antibiotics or other medicines. Take basic precautions such as washing your hands often, covering your mouth when you cough or sneeze, and avoiding public places where you could spread your illness to others.   Please continue drinking plenty of fluids.  Use over-the-counter medicines as needed as directed on packaging for symptom relief.  You may also use ibuprofen or tylenol  as directed on packaging for pain or fever.  Do not take multiple medicines containing Tylenol  or acetaminophen  to avoid taking too much of this medication.  Follow-up instructions: Please follow-up with your primary care provider in the next 3 days for further evaluation of your symptoms if you are not feeling better.   Return instructions:  Please return to the Emergency Department if you experience worsening symptoms.  RETURN IMMEDIATELY IF you develop shortness of breath, confusion or altered mental status, a new rash, become dizzy, faint, or poorly responsive, or are unable to be cared for at home. Please return if you have  persistent vomiting and cannot keep down fluids or develop a fever that is not controlled by tylenol  or motrin.   Please return if you have any other emergent concerns.  Additional Information:  Your vital signs today were: BP (!) 129/93 (BP Location: Right Arm)   Pulse 79   Temp 98.6 F (37 C) (Oral)   Resp 17    Wt 122.5 kg   SpO2 99%   BMI 38.74 kg/m  If your blood pressure (BP) was elevated above 135/85 this visit, please have this repeated by your doctor within one month. --------------

## 2024-02-12 NOTE — ED Provider Triage Note (Signed)
 Emergency Medicine Provider Triage Evaluation Note  TRIGGER Adam Avila , a 63 y.o. male  was evaluated in triage.  Pt complains of cough, congestion, cold symptoms for for 3 weeks and 3d of heavy chest congestion with sputum.  Review of Systems  Positive: Cough, congestion, pressue Negative: Fever, chills  Physical Exam  BP (!) 129/93 (BP Location: Right Arm)   Pulse 79   Temp 98.6 F (37 C) (Oral)   Resp 17   Wt 122.5 kg   SpO2 99%   BMI 38.74 kg/m  Gen:   Awake, no distress   Resp:  Normal effort, no wheezing MSK:   Moves extremities without difficulty   Medical Decision Making  Medically screening exam initiated at 9:57 AM.  Appropriate orders placed.  Adam Avila was informed that the remainder of the evaluation will be completed by another provider, this initial triage assessment does not replace that evaluation, and the importance of remaining in the ED until their evaluation is complete.     Adam Jerilynn RAMAN, MD 02/12/24 [phone]

## 2024-02-12 NOTE — ED Triage Notes (Signed)
 Pt c/o chest congestion, cough x 2 days. Spouse has flu. Nyquil last night. Assessed in triage by RT

## 2024-02-12 NOTE — ED Provider Notes (Signed)
 " [place] EMERGENCY DEPARTMENT AT [facility] Provider Note   CSN: [id number] Arrival date & time: 02/12/24  9062     Patient presents with: Cough   Adam Avila is a 63 y.o. male.   Patient to the emergency department for evaluation of chest congestion and cough, over the past 3 weeks, but has noted more body aches over the past couple of days.  No fevers.  He has had nasal congestion, facial pain, ear fullness.  He had amoxicillin  leftover at home which he took for about 4 days without improvement.  Reports congested cough, productive of sputum.  He has been trying Flonase , antihistamines, DayQuil.  Because symptoms were persisting, he decided to get checked out.  No persistent chest pain, just some tightness with coughing.  No radiation of pain.  No diaphoresis or vomiting.  No exertional symptoms.        Prior to Admission medications  Medication Sig Start Date End Date Taking? Authorizing Provider  acetaminophen  (TYLENOL ) 500 MG tablet Take 1,000 mg by mouth every 6 (six) hours as needed.    [provider]  amoxicillin  (AMOXIL ) 500 MG capsule Take 2,000 mg by mouth See admin instructions. Take 2000 mg 1 hour prior to dental work    [provider]  aspirin  EC 81 MG tablet Take 81 mg by mouth daily. Swallow whole.    [provider]  cetirizine (ZYRTEC) 10 MG tablet Take 10 mg by mouth daily.    [provider]  fluticasone  (FLONASE ) 50 MCG/ACT nasal spray Place 2 sprays into both nostrils 2 (two) times daily.    [provider]  HYDROcodone -acetaminophen  (NORCO/VICODIN) 5-325 MG tablet Take 1 tablet by mouth every 6 hours as needed for 5 days 06/12/23     montelukast  (SINGULAIR ) 10 MG tablet Take 1 tablet (10 mg total) by mouth daily. 05/19/23     Multiple Vitamins-Minerals (MENS 50+ MULTI VITAMIN/MIN) TABS Take 1 tablet by mouth daily.    [provider]  olmesartan  (BENICAR ) 40 MG tablet Take 1 tablet (40 mg total) by  mouth daily. 01/30/24     ondansetron  (ZOFRAN -ODT) 4 MG disintegrating tablet Take 1 tablet (4 mg total) by mouth every 8 (eight) hours as needed for nausea or vomiting. Patient not taking: Reported on 06/29/2023 04/26/23   Mayer, Jodi R, NP  PRESCRIPTION MEDICATION 1 each at bedtime. CPAP with nasal pillow    [provider]  promethazine -dextromethorphan  (PROMETHAZINE -DM) 6.25-15 MG/5ML syrup Take 5 mLs by mouth at bedtime as needed for cough. Patient not taking: Reported on 06/29/2023 05/25/23   Mayer, Jodi R, NP  rosuvastatin  (CRESTOR ) 5 MG tablet Take 1 tablet (5 mg total) by mouth daily. 01/01/24   Jeffrie Oneil BROCKS, MD  semaglutide -weight management (WEGOVY ) 1 MG/0.5ML SOAJ SQ injection Inject 1 mg into the skin once a week. 09/22/23     semaglutide -weight management (WEGOVY ) 2.4 MG/0.75ML SOAJ SQ injection Inject 2.4 mg into the skin once a week. 11/30/23     Semaglutide -Weight Management 0.5 MG/0.5ML SOAJ Inject 0.5 mg into the skin once a week. 08/11/23   Marlee Bernardino NOVAK, MD  tadalafil  (CIALIS ) 5 MG tablet Take 1 tablet (5 mg total) by mouth daily. 08/14/23     tamsulosin  (FLOMAX ) 0.4 MG CAPS capsule Take 1 capsule (0.4 mg total) by mouth daily. 07/17/23     tirzepatide  (MOUNJARO ) 2.5 MG/0.5ML Pen Inject 2.5 mg into the skin once a week. 08/02/23     traMADol  (ULTRAM ) 50  MG tablet Take 1 tablet (50 mg total) by mouth every 6 (six) hours as needed. 07/11/23 07/10/24  Pace, Maryellen D, MD    Allergies: Nsaids    Review of Systems  Updated Vital Signs BP (!) 129/93 (BP Location: Right Arm)   Pulse 79   Temp 98.6 F (37 C) (Oral)   Resp 17   Wt 122.5 kg   SpO2 99%   BMI 38.74 kg/m   Physical Exam Vitals and nursing note reviewed.  Constitutional:      Appearance: He is well-developed.  HENT:     Head: Normocephalic and atraumatic.     Jaw: No trismus.     Right Ear: Ear canal and external ear normal. A middle ear effusion is present. Tympanic membrane is not erythematous.      Left Ear: Ear canal and external ear normal. A middle ear effusion is present. Tympanic membrane is not erythematous.     Nose: No mucosal edema or rhinorrhea.     Right Sinus: Frontal sinus tenderness present.     Left Sinus: Frontal sinus tenderness present.     Mouth/Throat:     Mouth: Mucous membranes are not dry.     Pharynx: Uvula midline. No oropharyngeal exudate, posterior oropharyngeal erythema or uvula swelling.     Tonsils: No tonsillar abscesses.  Eyes:     General:        Right eye: No discharge.        Left eye: No discharge.     Conjunctiva/sclera: Conjunctivae normal.  Cardiovascular:     Rate and Rhythm: Normal rate and regular rhythm.     Heart sounds: Normal heart sounds.  Pulmonary:     Effort: Pulmonary effort is normal. No respiratory distress.     Breath sounds: Normal breath sounds. No wheezing or rales.     Comments: Frequent coughing during exam.  Lungs are clear, no wheezing or rales. Abdominal:     Palpations: Abdomen is soft.     Tenderness: There is no abdominal tenderness.  Musculoskeletal:     Cervical back: Normal range of motion and neck supple.  Skin:    General: Skin is warm and dry.  Neurological:     Mental Status: He is alert.     (all labs ordered are listed, but only abnormal results are displayed) Labs Reviewed  BASIC METABOLIC PANEL WITH GFR - Abnormal; Notable for the following components:      Result Value   Creatinine, Ser 1.28 (*)    All other components within normal limits  CBC WITH DIFFERENTIAL/PLATELET  TROPONIN T, HIGH SENSITIVITY    EKG: None  Radiology: DG Chest 2 View Result Date: 02/12/2024 EXAM: 2 VIEW(S) XRAY OF THE CHEST 02/12/2024 10:10:00 AM COMPARISON: 05/25/2023 CLINICAL HISTORY: sob sob FINDINGS: LUNGS AND PLEURA: No focal pulmonary opacity. No pleural effusion. No pneumothorax. HEART AND MEDIASTINUM: No acute abnormality of the cardiac and mediastinal silhouettes. BONES AND SOFT TISSUES: No acute osseous  abnormality. IMPRESSION: 1. No acute process. Electronically signed by: Franky Crease MD 02/12/2024 12:21 PM EST RP Workstation: [id number]     Procedures   Medications Ordered in the ED  albuterol (VENTOLIN HFA) 108 (90 Base) MCG/ACT inhaler 2 puff (has no administration in time range)   ED Course  Patient seen and examined. History obtained directly from patient. Work-up including labs, imaging, if performed, were reviewed.    Labs/EKG: Independently reviewed and interpreted.  This included: CBC unremarkable; BMP creatinine 1.28 but otherwise unremarkable;  troponin less than 15.  Imaging: Chest x-ray, agree no pneumonia.  Medications/Fluids: Ordered: Albuterol HFA  Most recent vital signs reviewed and are as follows: BP (!) 129/93 (BP Location: Right Arm)   Pulse 79   Temp 98.6 F (37 C) (Oral)   Resp 17   Wt 122.5 kg   SpO2 99%   BMI 38.74 kg/m   Initial impression: Upper respiratory infection with cough, greater than 10 days.  Suspect sinusitis.  Will treat with Augmentin .  Will give medicine for cough.  Albuterol inhaler for home.  Plan: Discharge to home.   Prescriptions written for: Augmentin , Tessalon   Other home care instructions discussed: Discussed importance of rest, maintaining good hydration. Also discussed use of OTC meds as desired for symptomatic treatment. Discussed typical course of generic viral illness.   ED return instructions discussed: Encouraged return with severe symptoms or significant worsening of current symptoms.  This includes high persistent fever, persistent vomiting, worsening difficulty breathing or shortness of breath, increased work of breathing, or other concerns.  Follow-up instructions discussed: Patient encouraged to follow-up with their PCP in 5 days if not improving.                                  Medical Decision Making Amount and/or Complexity of Data Reviewed Labs: ordered. Radiology: ordered.  Risk Prescription drug  management.   Patient with 2 to 3 weeks of URI symptoms and cough.  Slightly worse over the past few days.  Chest x-ray without signs of pneumonia.  Lab workup is reassuring.  Symptoms are not typical for ACS.  Troponin was less than 15.  Will treat sinusitis.  Home with albuterol inhaler to use as well.  Patient appears well, nontoxic.  The patient's vital signs, pertinent lab work and imaging were reviewed and interpreted as discussed in the ED course. Hospitalization was considered for further testing, treatments, or serial exams/observation. However as patient is well-appearing, has a stable exam, and reassuring studies today, I do not feel that they warrant admission at this time. This plan was discussed with the patient who verbalizes agreement and comfort with this plan and seems reliable and able to return to the Emergency Department with worsening or changing symptoms.       Final diagnoses:  Acute non-recurrent sinusitis, unspecified location  Acute cough    ED Discharge Orders     None          Desiderio Chew, PA-C 02/12/24 1408  "

## 2024-02-27 ENCOUNTER — Other Ambulatory Visit (HOSPITAL_COMMUNITY): Payer: Self-pay

## 2024-02-27 ENCOUNTER — Other Ambulatory Visit: Payer: Self-pay

## 2024-02-27 MED ORDER — TADALAFIL 5 MG PO TABS
5.0000 mg | ORAL_TABLET | Freq: Every day | ORAL | 0 refills | Status: AC
  Filled 2024-02-27: qty 30, 30d supply, fill #0
